# Patient Record
Sex: Female | Born: 1980 | Race: White | Hispanic: No | Marital: Single | Smoking: Never smoker
Health system: Southern US, Community
[De-identification: ages and names within clinical notes are randomized; demographics above are authoritative.]

## PROBLEM LIST (undated history)

## (undated) ENCOUNTER — Inpatient Hospital Stay (HOSPITAL_COMMUNITY): Payer: BLUE CROSS/BLUE SHIELD

## (undated) DIAGNOSIS — K8020 Calculus of gallbladder without cholecystitis without obstruction: Secondary | ICD-10-CM

## (undated) DIAGNOSIS — E042 Nontoxic multinodular goiter: Secondary | ICD-10-CM

## (undated) DIAGNOSIS — Z5189 Encounter for other specified aftercare: Secondary | ICD-10-CM

## (undated) HISTORY — DX: Encounter for other specified aftercare: Z51.89

---

## 2002-10-26 ENCOUNTER — Other Ambulatory Visit: Admission: RE | Admit: 2002-10-26 | Discharge: 2002-10-26 | Payer: Self-pay | Admitting: Family Medicine

## 2004-01-04 ENCOUNTER — Other Ambulatory Visit: Admission: RE | Admit: 2004-01-04 | Discharge: 2004-01-04 | Payer: Self-pay | Admitting: *Deleted

## 2007-08-31 ENCOUNTER — Other Ambulatory Visit: Admission: RE | Admit: 2007-08-31 | Discharge: 2007-08-31 | Payer: Self-pay | Admitting: Family Medicine

## 2007-09-14 ENCOUNTER — Encounter: Admission: RE | Admit: 2007-09-14 | Discharge: 2007-09-14 | Payer: Self-pay | Admitting: Family Medicine

## 2008-08-31 ENCOUNTER — Other Ambulatory Visit: Admission: RE | Admit: 2008-08-31 | Discharge: 2008-08-31 | Payer: Self-pay | Admitting: Family Medicine

## 2008-09-07 ENCOUNTER — Encounter: Admission: RE | Admit: 2008-09-07 | Discharge: 2008-09-07 | Payer: Self-pay | Admitting: Family Medicine

## 2009-10-09 ENCOUNTER — Encounter: Admission: RE | Admit: 2009-10-09 | Discharge: 2009-10-09 | Payer: Self-pay | Admitting: Family Medicine

## 2009-11-01 ENCOUNTER — Other Ambulatory Visit: Admission: RE | Admit: 2009-11-01 | Discharge: 2009-11-01 | Payer: Self-pay | Admitting: Obstetrics and Gynecology

## 2011-05-06 ENCOUNTER — Other Ambulatory Visit (HOSPITAL_COMMUNITY)
Admission: RE | Admit: 2011-05-06 | Discharge: 2011-05-06 | Disposition: A | Discharge status: Home / Self Care | Payer: Self-pay | Source: Ambulatory Visit | Attending: Family Medicine | Admitting: Family Medicine

## 2011-05-06 ENCOUNTER — Other Ambulatory Visit: Payer: Self-pay | Admitting: Family Medicine

## 2011-05-06 DIAGNOSIS — Z124 Encounter for screening for malignant neoplasm of cervix: Secondary | ICD-10-CM | POA: Insufficient documentation

## 2011-05-06 DIAGNOSIS — Z1159 Encounter for screening for other viral diseases: Secondary | ICD-10-CM | POA: Insufficient documentation

## 2013-03-07 ENCOUNTER — Other Ambulatory Visit (HOSPITAL_COMMUNITY)
Admission: RE | Admit: 2013-03-07 | Discharge: 2013-03-07 | Disposition: A | Discharge status: Home / Self Care | Payer: BC Managed Care – PPO | Source: Ambulatory Visit | Attending: Family Medicine | Admitting: Family Medicine

## 2013-03-07 ENCOUNTER — Other Ambulatory Visit: Payer: Self-pay | Admitting: Family Medicine

## 2013-03-07 DIAGNOSIS — Z113 Encounter for screening for infections with a predominantly sexual mode of transmission: Secondary | ICD-10-CM | POA: Insufficient documentation

## 2013-03-07 DIAGNOSIS — Z01419 Encounter for gynecological examination (general) (routine) without abnormal findings: Secondary | ICD-10-CM | POA: Insufficient documentation

## 2013-03-07 DIAGNOSIS — Z1151 Encounter for screening for human papillomavirus (HPV): Secondary | ICD-10-CM | POA: Insufficient documentation

## 2013-03-08 ENCOUNTER — Other Ambulatory Visit: Payer: Self-pay | Admitting: Family Medicine

## 2013-03-08 DIAGNOSIS — E041 Nontoxic single thyroid nodule: Secondary | ICD-10-CM

## 2013-03-15 ENCOUNTER — Ambulatory Visit
Admission: RE | Admit: 2013-03-15 | Discharge: 2013-03-15 | Disposition: A | Discharge status: Home / Self Care | Payer: BC Managed Care – PPO | Source: Ambulatory Visit | Attending: Family Medicine | Admitting: Family Medicine

## 2013-03-15 DIAGNOSIS — E041 Nontoxic single thyroid nodule: Secondary | ICD-10-CM

## 2014-12-15 ENCOUNTER — Other Ambulatory Visit: Payer: Self-pay | Admitting: Family Medicine

## 2014-12-15 DIAGNOSIS — N938 Other specified abnormal uterine and vaginal bleeding: Secondary | ICD-10-CM

## 2014-12-15 DIAGNOSIS — N926 Irregular menstruation, unspecified: Secondary | ICD-10-CM

## 2014-12-22 ENCOUNTER — Other Ambulatory Visit: Payer: Self-pay

## 2014-12-28 ENCOUNTER — Ambulatory Visit
Admission: RE | Admit: 2014-12-28 | Discharge: 2014-12-28 | Disposition: A | Discharge status: Home / Self Care | Payer: 59 | Source: Ambulatory Visit | Attending: Family Medicine | Admitting: Family Medicine

## 2014-12-28 DIAGNOSIS — N938 Other specified abnormal uterine and vaginal bleeding: Secondary | ICD-10-CM

## 2014-12-28 DIAGNOSIS — N926 Irregular menstruation, unspecified: Secondary | ICD-10-CM

## 2015-07-05 ENCOUNTER — Other Ambulatory Visit: Payer: Self-pay | Admitting: Family Medicine

## 2015-07-05 DIAGNOSIS — E041 Nontoxic single thyroid nodule: Secondary | ICD-10-CM

## 2015-07-12 ENCOUNTER — Ambulatory Visit
Admission: RE | Admit: 2015-07-12 | Discharge: 2015-07-12 | Disposition: A | Discharge status: Home / Self Care | Payer: 59 | Source: Ambulatory Visit | Attending: Family Medicine | Admitting: Family Medicine

## 2015-07-12 DIAGNOSIS — E041 Nontoxic single thyroid nodule: Secondary | ICD-10-CM

## 2015-09-28 ENCOUNTER — Other Ambulatory Visit (HOSPITAL_COMMUNITY)
Admission: RE | Admit: 2015-09-28 | Discharge: 2015-09-28 | Disposition: A | Discharge status: Home / Self Care | Payer: BLUE CROSS/BLUE SHIELD | Source: Ambulatory Visit | Attending: Physician Assistant | Admitting: Physician Assistant

## 2015-09-28 ENCOUNTER — Other Ambulatory Visit: Payer: Self-pay | Admitting: Physician Assistant

## 2015-09-28 DIAGNOSIS — Z113 Encounter for screening for infections with a predominantly sexual mode of transmission: Secondary | ICD-10-CM | POA: Diagnosis present

## 2015-09-28 DIAGNOSIS — Z01419 Encounter for gynecological examination (general) (routine) without abnormal findings: Secondary | ICD-10-CM | POA: Insufficient documentation

## 2015-10-02 LAB — CYTOLOGY - PAP

## 2015-11-29 ENCOUNTER — Other Ambulatory Visit: Payer: Self-pay | Admitting: Obstetrics & Gynecology

## 2015-12-27 DIAGNOSIS — N939 Abnormal uterine and vaginal bleeding, unspecified: Secondary | ICD-10-CM | POA: Diagnosis not present

## 2015-12-27 DIAGNOSIS — Z3009 Encounter for other general counseling and advice on contraception: Secondary | ICD-10-CM | POA: Diagnosis not present

## 2016-01-31 DIAGNOSIS — L304 Erythema intertrigo: Secondary | ICD-10-CM | POA: Diagnosis not present

## 2016-03-28 DIAGNOSIS — L259 Unspecified contact dermatitis, unspecified cause: Secondary | ICD-10-CM | POA: Diagnosis not present

## 2016-05-27 DIAGNOSIS — N912 Amenorrhea, unspecified: Secondary | ICD-10-CM | POA: Diagnosis not present

## 2016-05-27 DIAGNOSIS — Z202 Contact with and (suspected) exposure to infections with a predominantly sexual mode of transmission: Secondary | ICD-10-CM | POA: Diagnosis not present

## 2016-05-27 DIAGNOSIS — E669 Obesity, unspecified: Secondary | ICD-10-CM | POA: Diagnosis not present

## 2016-07-07 DIAGNOSIS — Z0000 Encounter for general adult medical examination without abnormal findings: Secondary | ICD-10-CM | POA: Diagnosis not present

## 2016-07-07 DIAGNOSIS — Z23 Encounter for immunization: Secondary | ICD-10-CM | POA: Diagnosis not present

## 2016-07-07 DIAGNOSIS — Z13220 Encounter for screening for lipoid disorders: Secondary | ICD-10-CM | POA: Diagnosis not present

## 2016-07-07 DIAGNOSIS — Z131 Encounter for screening for diabetes mellitus: Secondary | ICD-10-CM | POA: Diagnosis not present

## 2016-09-05 ENCOUNTER — Other Ambulatory Visit: Payer: Self-pay | Admitting: Physician Assistant

## 2016-09-05 ENCOUNTER — Ambulatory Visit
Admission: RE | Admit: 2016-09-05 | Discharge: 2016-09-05 | Disposition: A | Discharge status: Home / Self Care | Payer: BLUE CROSS/BLUE SHIELD | Source: Ambulatory Visit | Attending: Physician Assistant | Admitting: Physician Assistant

## 2016-09-05 DIAGNOSIS — M545 Low back pain: Secondary | ICD-10-CM | POA: Diagnosis not present

## 2016-09-05 DIAGNOSIS — S20219A Contusion of unspecified front wall of thorax, initial encounter: Secondary | ICD-10-CM | POA: Diagnosis not present

## 2016-09-05 DIAGNOSIS — M5430 Sciatica, unspecified side: Secondary | ICD-10-CM | POA: Diagnosis not present

## 2016-10-02 DIAGNOSIS — J45909 Unspecified asthma, uncomplicated: Secondary | ICD-10-CM | POA: Diagnosis not present

## 2017-01-08 DIAGNOSIS — Z6841 Body Mass Index (BMI) 40.0 and over, adult: Secondary | ICD-10-CM | POA: Diagnosis not present

## 2017-01-08 DIAGNOSIS — K219 Gastro-esophageal reflux disease without esophagitis: Secondary | ICD-10-CM | POA: Diagnosis not present

## 2017-01-08 DIAGNOSIS — Z7689 Persons encountering health services in other specified circumstances: Secondary | ICD-10-CM | POA: Diagnosis not present

## 2017-01-08 DIAGNOSIS — Z713 Dietary counseling and surveillance: Secondary | ICD-10-CM | POA: Diagnosis not present

## 2017-01-08 DIAGNOSIS — Z87891 Personal history of nicotine dependence: Secondary | ICD-10-CM | POA: Diagnosis not present

## 2017-01-15 DIAGNOSIS — Z6841 Body Mass Index (BMI) 40.0 and over, adult: Secondary | ICD-10-CM | POA: Diagnosis not present

## 2017-01-15 DIAGNOSIS — F54 Psychological and behavioral factors associated with disorders or diseases classified elsewhere: Secondary | ICD-10-CM | POA: Diagnosis not present

## 2017-01-15 DIAGNOSIS — Z87891 Personal history of nicotine dependence: Secondary | ICD-10-CM | POA: Diagnosis not present

## 2017-01-15 DIAGNOSIS — M20011 Mallet finger of right finger(s): Secondary | ICD-10-CM | POA: Diagnosis not present

## 2017-02-25 DIAGNOSIS — Z7689 Persons encountering health services in other specified circumstances: Secondary | ICD-10-CM | POA: Diagnosis not present

## 2017-02-25 DIAGNOSIS — Z6841 Body Mass Index (BMI) 40.0 and over, adult: Secondary | ICD-10-CM | POA: Diagnosis not present

## 2017-02-25 DIAGNOSIS — K2950 Unspecified chronic gastritis without bleeding: Secondary | ICD-10-CM | POA: Diagnosis not present

## 2017-02-25 DIAGNOSIS — K2901 Acute gastritis with bleeding: Secondary | ICD-10-CM | POA: Diagnosis not present

## 2017-02-25 DIAGNOSIS — K219 Gastro-esophageal reflux disease without esophagitis: Secondary | ICD-10-CM | POA: Diagnosis not present

## 2017-02-25 DIAGNOSIS — K228 Other specified diseases of esophagus: Secondary | ICD-10-CM | POA: Diagnosis not present

## 2017-02-26 DIAGNOSIS — M20011 Mallet finger of right finger(s): Secondary | ICD-10-CM | POA: Diagnosis not present

## 2017-03-18 DIAGNOSIS — Z6841 Body Mass Index (BMI) 40.0 and over, adult: Secondary | ICD-10-CM | POA: Diagnosis not present

## 2017-03-18 DIAGNOSIS — Z7689 Persons encountering health services in other specified circumstances: Secondary | ICD-10-CM | POA: Diagnosis not present

## 2017-03-18 DIAGNOSIS — Z01818 Encounter for other preprocedural examination: Secondary | ICD-10-CM | POA: Diagnosis not present

## 2017-03-26 DIAGNOSIS — M20011 Mallet finger of right finger(s): Secondary | ICD-10-CM | POA: Diagnosis not present

## 2017-04-23 DIAGNOSIS — M20011 Mallet finger of right finger(s): Secondary | ICD-10-CM | POA: Diagnosis not present

## 2017-05-29 DIAGNOSIS — M20011 Mallet finger of right finger(s): Secondary | ICD-10-CM | POA: Diagnosis not present

## 2017-07-02 DIAGNOSIS — Z01818 Encounter for other preprocedural examination: Secondary | ICD-10-CM | POA: Diagnosis not present

## 2017-07-02 DIAGNOSIS — Z87891 Personal history of nicotine dependence: Secondary | ICD-10-CM | POA: Diagnosis not present

## 2017-07-02 DIAGNOSIS — K2950 Unspecified chronic gastritis without bleeding: Secondary | ICD-10-CM | POA: Diagnosis not present

## 2017-07-02 DIAGNOSIS — E876 Hypokalemia: Secondary | ICD-10-CM | POA: Diagnosis not present

## 2017-07-02 DIAGNOSIS — E559 Vitamin D deficiency, unspecified: Secondary | ICD-10-CM | POA: Diagnosis not present

## 2017-07-02 DIAGNOSIS — K219 Gastro-esophageal reflux disease without esophagitis: Secondary | ICD-10-CM | POA: Diagnosis not present

## 2017-07-02 DIAGNOSIS — G4730 Sleep apnea, unspecified: Secondary | ICD-10-CM | POA: Diagnosis not present

## 2017-07-02 DIAGNOSIS — Z713 Dietary counseling and surveillance: Secondary | ICD-10-CM | POA: Diagnosis not present

## 2017-07-02 DIAGNOSIS — Z6841 Body Mass Index (BMI) 40.0 and over, adult: Secondary | ICD-10-CM | POA: Diagnosis not present

## 2017-07-02 HISTORY — PX: LAPAROSCOPIC GASTRIC SLEEVE RESECTION: SHX5895

## 2017-07-14 DIAGNOSIS — Z23 Encounter for immunization: Secondary | ICD-10-CM | POA: Diagnosis not present

## 2017-07-17 DIAGNOSIS — K219 Gastro-esophageal reflux disease without esophagitis: Secondary | ICD-10-CM | POA: Diagnosis not present

## 2017-07-17 DIAGNOSIS — Z6841 Body Mass Index (BMI) 40.0 and over, adult: Secondary | ICD-10-CM | POA: Diagnosis not present

## 2017-07-17 DIAGNOSIS — K449 Diaphragmatic hernia without obstruction or gangrene: Secondary | ICD-10-CM | POA: Diagnosis not present

## 2017-07-17 DIAGNOSIS — R609 Edema, unspecified: Secondary | ICD-10-CM | POA: Diagnosis not present

## 2017-07-17 DIAGNOSIS — Z87891 Personal history of nicotine dependence: Secondary | ICD-10-CM | POA: Diagnosis not present

## 2017-08-03 DIAGNOSIS — Z131 Encounter for screening for diabetes mellitus: Secondary | ICD-10-CM | POA: Diagnosis not present

## 2017-08-03 DIAGNOSIS — Z202 Contact with and (suspected) exposure to infections with a predominantly sexual mode of transmission: Secondary | ICD-10-CM | POA: Diagnosis not present

## 2017-08-03 DIAGNOSIS — Z13220 Encounter for screening for lipoid disorders: Secondary | ICD-10-CM | POA: Diagnosis not present

## 2017-08-03 DIAGNOSIS — Z0000 Encounter for general adult medical examination without abnormal findings: Secondary | ICD-10-CM | POA: Diagnosis not present

## 2017-08-04 DIAGNOSIS — Z6841 Body Mass Index (BMI) 40.0 and over, adult: Secondary | ICD-10-CM | POA: Diagnosis not present

## 2017-08-04 DIAGNOSIS — Z48815 Encounter for surgical aftercare following surgery on the digestive system: Secondary | ICD-10-CM | POA: Diagnosis not present

## 2017-08-04 DIAGNOSIS — F54 Psychological and behavioral factors associated with disorders or diseases classified elsewhere: Secondary | ICD-10-CM | POA: Diagnosis not present

## 2017-08-04 DIAGNOSIS — Z9884 Bariatric surgery status: Secondary | ICD-10-CM | POA: Diagnosis not present

## 2017-08-04 DIAGNOSIS — Z713 Dietary counseling and surveillance: Secondary | ICD-10-CM | POA: Diagnosis not present

## 2017-10-16 DIAGNOSIS — Z9884 Bariatric surgery status: Secondary | ICD-10-CM | POA: Diagnosis not present

## 2017-10-16 DIAGNOSIS — K912 Postsurgical malabsorption, not elsewhere classified: Secondary | ICD-10-CM | POA: Diagnosis not present

## 2017-10-16 DIAGNOSIS — Z713 Dietary counseling and surveillance: Secondary | ICD-10-CM | POA: Diagnosis not present

## 2017-10-16 DIAGNOSIS — Z6837 Body mass index (BMI) 37.0-37.9, adult: Secondary | ICD-10-CM | POA: Diagnosis not present

## 2017-10-16 DIAGNOSIS — Z48815 Encounter for surgical aftercare following surgery on the digestive system: Secondary | ICD-10-CM | POA: Diagnosis not present

## 2017-10-16 DIAGNOSIS — E669 Obesity, unspecified: Secondary | ICD-10-CM | POA: Diagnosis not present

## 2017-10-16 DIAGNOSIS — E739 Lactose intolerance, unspecified: Secondary | ICD-10-CM | POA: Diagnosis not present

## 2017-10-16 DIAGNOSIS — Z87891 Personal history of nicotine dependence: Secondary | ICD-10-CM | POA: Diagnosis not present

## 2018-01-21 DIAGNOSIS — L237 Allergic contact dermatitis due to plants, except food: Secondary | ICD-10-CM | POA: Diagnosis not present

## 2018-02-25 DIAGNOSIS — I87393 Chronic venous hypertension (idiopathic) with other complications of bilateral lower extremity: Secondary | ICD-10-CM | POA: Diagnosis not present

## 2018-04-17 ENCOUNTER — Emergency Department: Payer: BLUE CROSS/BLUE SHIELD

## 2018-04-17 ENCOUNTER — Emergency Department
Admission: EM | Admit: 2018-04-17 | Discharge: 2018-04-17 | Disposition: A | Discharge status: Home / Self Care | Payer: BLUE CROSS/BLUE SHIELD | Attending: Emergency Medicine | Admitting: Emergency Medicine

## 2018-04-17 ENCOUNTER — Other Ambulatory Visit: Payer: Self-pay

## 2018-04-17 DIAGNOSIS — R0789 Other chest pain: Secondary | ICD-10-CM

## 2018-04-17 DIAGNOSIS — R079 Chest pain, unspecified: Secondary | ICD-10-CM | POA: Diagnosis not present

## 2018-04-17 DIAGNOSIS — K8020 Calculus of gallbladder without cholecystitis without obstruction: Secondary | ICD-10-CM | POA: Diagnosis not present

## 2018-04-17 LAB — CBC
HEMATOCRIT: 37.9 % (ref 35.0–47.0)
Hemoglobin: 13.2 g/dL (ref 12.0–16.0)
MCH: 30.6 pg (ref 26.0–34.0)
MCHC: 35 g/dL (ref 32.0–36.0)
MCV: 87.5 fL (ref 80.0–100.0)
Platelets: 223 10*3/uL (ref 150–440)
RBC: 4.33 MIL/uL (ref 3.80–5.20)
RDW: 13 % (ref 11.5–14.5)
WBC: 8 10*3/uL (ref 3.6–11.0)

## 2018-04-17 LAB — BASIC METABOLIC PANEL
ANION GAP: 9 (ref 5–15)
BUN: 18 mg/dL (ref 6–20)
CHLORIDE: 107 mmol/L (ref 98–111)
CO2: 23 mmol/L (ref 22–32)
Calcium: 8.5 mg/dL — ABNORMAL LOW (ref 8.9–10.3)
Creatinine, Ser: 0.84 mg/dL (ref 0.44–1.00)
GFR calc Af Amer: >60 mL/min (ref >60)
GFR calc non Af Amer: >60 mL/min (ref >60)
GLUCOSE: 144 mg/dL — AB (ref 70–99)
POTASSIUM: 3.8 mmol/L (ref 3.5–5.1)
Sodium: 139 mmol/L (ref 135–145)

## 2018-04-17 LAB — HEPATIC FUNCTION PANEL
ALBUMIN: 3.6 g/dL (ref 3.5–5.0)
ALT: 9 U/L (ref 0–44)
AST: 21 U/L (ref 15–41)
Alkaline Phosphatase: 54 U/L (ref 38–126)
BILIRUBIN TOTAL: 0.6 mg/dL (ref 0.3–1.2)
Total Protein: 6.6 g/dL (ref 6.5–8.1)

## 2018-04-17 LAB — TROPONIN I
Troponin I: <0.03 ng/mL (ref <0.03)
Troponin I: <0.03 ng/mL (ref <0.03)

## 2018-04-17 LAB — LIPASE, BLOOD: Lipase: 34 U/L (ref 11–51)

## 2018-04-17 MED ORDER — OXYCODONE-ACETAMINOPHEN 5-325 MG PO TABS: 1.0000 | ORAL_TABLET | Freq: Four times a day (QID) | ORAL | 0 refills | Status: DC | PRN

## 2018-04-17 MED ORDER — ONDANSETRON HCL 4 MG/2ML IJ SOLN
4.0000 mg | Freq: Once | INTRAMUSCULAR | Status: AC
  Administered 2018-04-17: 4 mg via INTRAVENOUS
  Filled 2018-04-17: qty 2

## 2018-04-17 MED ORDER — MORPHINE SULFATE (PF) 4 MG/ML IV SOLN
4.0000 mg | Freq: Once | INTRAVENOUS | Status: AC
Start: 2018-04-17 — End: 2018-04-17
  Administered 2018-04-17: 4 mg via INTRAVENOUS
  Filled 2018-04-17: qty 1

## 2018-04-17 MED ORDER — SODIUM CHLORIDE 0.9 % IV BOLUS
1000.0000 mL | Freq: Once | INTRAVENOUS | Status: AC
  Administered 2018-04-17: 1000 mL via INTRAVENOUS

## 2018-04-17 MED ORDER — FAMOTIDINE IN NACL 20-0.9 MG/50ML-% IV SOLN
20.0000 mg | Freq: Once | INTRAVENOUS | Status: AC
  Administered 2018-04-17: 20 mg via INTRAVENOUS
  Filled 2018-04-17: qty 50

## 2018-04-17 NOTE — ED Notes (Signed)
Reviewed discharge instructions, follow-up care, and prescriptions with patient. Patient verbalized understanding of all information reviewed. Patient stable, with no distress noted at this time.    

## 2018-04-17 NOTE — ED Notes (Signed)
Pt to ultrasound

## 2018-04-17 NOTE — ED Notes (Signed)
Pt assisted to restroom.  

## 2018-04-17 NOTE — ED Notes (Signed)
Pt requested beverage, Dr Marisa SeverinSiadecki granted permission, pt was given apple juice at her request. Pt drank beverage without incident.

## 2018-04-17 NOTE — ED Triage Notes (Addendum)
Pt arrives via [facility] c/o acute onset CP. Pt states her pain has largely resolved since arrival describing an episode about an hour previously of 10/10 non radiating, pressure like centralized CP, positive for SOB/N/diaphesis. Pt also states she has been having indigestion problems x 1 week.   Protocol initiated.

## 2018-04-17 NOTE — Discharge Instructions (Signed)
Make an appointment to follow-up with the general surgeon within the next few weeks.  You may take the prescribed medication if you have an episode of pain similar to today.  Return to the ER immediately for new, worsening, persistent severe pain, nausea or vomiting, fever, weakness, or any other new or worsening symptoms that concern you.

## 2018-04-17 NOTE — ED Provider Notes (Signed)
[facility] Emergency Department Provider Note ____________________________________________   First MD Initiated Contact with Patient 04/17/18 1438     (approximate)  I have reviewed the triage vital signs and the nursing notes.   HISTORY  Chief Complaint Chest Pain    HPI KORRINE SICARD is a 37 y.o. 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  The patient describes the discomfort as pressure or squeezing.  It was not exertional.  She states that the diaphoresis and near syncopal symptoms have resolved after she took a Prilosec although she still intermittently feels the pressure.  She states she had one similar prior episode after her surgery which improved after she took an antacid, but she states today's episode was more severe.   History reviewed. No pertinent past medical history.  There are no active problems to display for this patient.   Past Surgical History:  Procedure Laterality Date  . LAPAROSCOPIC GASTRIC BANDING WITH HIATAL HERNIA REPAIR      Prior to Admission medications   Medication Sig Start Date End Date Taking? Authorizing Provider  oxyCODONE-acetaminophen (PERCOCET) 5-325 MG tablet Take 1 tablet by mouth every 6 (six) hours as needed for severe pain. 04/17/18   Dionne Bucy, MD    Allergies Patient has no known allergies.  History reviewed. No pertinent family history.  Social History Social History   Tobacco Use  . Smoking status: Never Smoker  . Smokeless tobacco: Never Used  Substance Use Topics  . Alcohol use: Not Currently  . Drug use: Never    Review of Systems  Constitutional: No fever. Eyes: No redness. ENT: No sore throat. Cardiovascular: Positive for chest pain. Respiratory: Positive for resolved shortness of  breath. Gastrointestinal: Positive for resolved nausea.  Genitourinary: Negative for flank pain.  Musculoskeletal: Negative for back pain. Skin: Negative for rash. Neurological: Negative for headache.   ____________________________________________   PHYSICAL EXAM:  VITAL SIGNS: ED Triage Vitals  Enc Vitals Group     BP --      Pulse --      Resp --      Temp 04/17/18 1443 97.8 F (36.6 C)     Temp Source 04/17/18 1443 Oral     SpO2 --      Weight 04/17/18 1444 260 lb (117.9 kg)     Height 04/17/18 1444 5\' 8"  (1.727 m)     Head Circumference --      Peak Flow --      Pain Score 04/17/18 1443 4     Pain Loc --      Pain Edu? --      Excl. in GC? --     Constitutional: Alert and oriented.  Relatively well appearing and in no acute distress. Eyes: Conjunctivae are normal.  Head: Atraumatic. Nose: No congestion/rhinnorhea. Mouth/Throat: Mucous membranes are moist.   Neck: Normal range of motion.  Cardiovascular: Normal rate, regular rhythm. Grossly normal heart sounds.  Good peripheral circulation. Respiratory: Normal respiratory effort.  No retractions. Lungs CTAB. Gastrointestinal: Soft and nontender. No distention.  Genitourinary: No flank tenderness. Musculoskeletal: No lower extremity edema.  Extremities warm and well perfused.  No calf or popliteal swelling or tenderness. Neurologic:  Normal speech and language. No gross focal neurologic deficits are appreciated.  Skin:  Skin is warm and dry. No rash noted.  Psychiatric: Mood and affect are normal. Speech and behavior are normal.  ____________________________________________   LABS (all labs ordered are listed, but only abnormal results are displayed)  Labs Reviewed  BASIC METABOLIC PANEL - Abnormal; Notable for the following components:      Result Value   Glucose, Bld 144 (*)    Calcium 8.5 (*)    All other components within normal limits  CBC  TROPONIN I  LIPASE, BLOOD  HEPATIC FUNCTION PANEL    TROPONIN I   ____________________________________________  EKG  ED ECG REPORT I, SebastDionne Bucyian Clevie Prout, the attending physician, personally viewed and interpreted this ECG.  Date: 04/17/2018 EKG Time: 1429 Rate: 78 Rhythm: normal sinus rhythm QRS Axis: normal Intervals: normal ST/T Wave abnormalities: normal Narrative Interpretation: no evidence of acute ischemia  ____________________________________________  RADIOLOGY  CXR: No focal infiltrate or other acute abnormalities US RUQ: Cholelithiasis  ____________________________________________   PROCEDURES  Procedure(s) performed: No  Procedures  Critical Care performed: No ____________________________________________   INITIAL IMPRESSION / ASSESSMENT AND PLAN / ED COURSE  Pertinent labs & imaging results that were available during my care of the patient were reviewed by me and considered in my medical decision making (see chart for details).  37 year old female with history of gastric sleeve surgery presents with atypical, nonexertional chest discomfort associated with apparent near syncope earlier today while she was at work.  She reports one prior similar episode related to likely reflux after her gastric surgery.  On exam, the patient is slightly anxious but otherwise relatively well-appearing.  Her vital signs are normal.  The remainder of the exam is as described above.  Her EKG is nonischemic.  Overall I suspect most likely GI cause including GERD, gastritis, or less likely pancreatitis.  The patient has no cardiac risk factors and given the nonexertional nature of the symptoms, the fact that the improved after Prilosec, and the normal EKG, ACS is extremely unlikely.  Given the normal vital signs and well appearance, there is no evidence of aortic dissection or other vascular etiology.  Although the patient is on ICPs, with a normal heart rate and O2 saturation as well as symptoms improving on their own, and no  symptoms to suggest DVT, there is no evidence of PE.  Plan: IV fluids, symptomatic treatment with Pepcid, troponins x2, basic and hepatobiliary labs, and reassess.  ----------------------------------------- 4:57 PM on 04/17/2018 -----------------------------------------  Initial labs were unremarkable, however the patient reported that the pain started to increase again.  On further discussion with the patient and her mother who is in the room with her, there is an extensive family history of gallbladder disease, and the mother had cholecystitis presenting with similar symptoms to what the patient is experiencing today.  Although the patient has normal LFTs and no abdominal tenderness, this could certainly be an atypical presentation of biliary colic so I will obtain a right upper quadrant ultrasound.  ----------------------------------------- 7:29 PM on 04/17/2018 -----------------------------------------  Ultrasound shows cholelithiasis, with a stone in the gallbladder neck but no evidence of acute cholecystitis.  The patient's symptoms have significantly improved.  Repeat troponin after 3 hours is also negative.  It is possible that the patient's pain today was in fact caused by biliary colic.  I counseled the patient extensively on the results of the work-up.  We have provided referral to a general surgeon, and I also thoroughly discussed return precautions with the patient.  She expresses understanding, and agrees with the plan of care.  She feels comfortable to  go home.  ____________________________________________   FINAL CLINICAL IMPRESSION(S) / ED DIAGNOSES  Final diagnoses:  Cholelithiasis  Chest discomfort      NEW MEDICATIONS STARTED DURING THIS VISIT:  New Prescriptions   OXYCODONE-ACETAMINOPHEN (PERCOCET) 5-325 MG TABLET    Take 1 tablet by mouth every 6 (six) hours as needed for severe pain.     Note:  This document was prepared using Dragon voice recognition  software and may include unintentional dictation errors.    Dionne BucySiadecki, Prerana Strayer, MD 04/17/18 1930

## 2018-04-19 ENCOUNTER — Emergency Department (HOSPITAL_COMMUNITY): Payer: BLUE CROSS/BLUE SHIELD

## 2018-04-19 ENCOUNTER — Other Ambulatory Visit: Payer: Self-pay

## 2018-04-19 ENCOUNTER — Encounter (HOSPITAL_COMMUNITY): Payer: Self-pay | Admitting: *Deleted

## 2018-04-19 ENCOUNTER — Inpatient Hospital Stay (HOSPITAL_COMMUNITY)
Admission: EM | Admit: 2018-04-19 | Discharge: 2018-04-22 | DRG: 419 | Disposition: A | Discharge status: Home / Self Care | Payer: BLUE CROSS/BLUE SHIELD | Attending: General Surgery | Admitting: General Surgery

## 2018-04-19 DIAGNOSIS — K2960 Other gastritis without bleeding: Secondary | ICD-10-CM

## 2018-04-19 DIAGNOSIS — K824 Cholesterolosis of gallbladder: Secondary | ICD-10-CM | POA: Diagnosis not present

## 2018-04-19 DIAGNOSIS — Z9884 Bariatric surgery status: Secondary | ICD-10-CM | POA: Diagnosis not present

## 2018-04-19 DIAGNOSIS — K8020 Calculus of gallbladder without cholecystitis without obstruction: Secondary | ICD-10-CM | POA: Diagnosis not present

## 2018-04-19 DIAGNOSIS — R1011 Right upper quadrant pain: Secondary | ICD-10-CM

## 2018-04-19 DIAGNOSIS — K668 Other specified disorders of peritoneum: Secondary | ICD-10-CM | POA: Diagnosis not present

## 2018-04-19 DIAGNOSIS — K8010 Calculus of gallbladder with chronic cholecystitis without obstruction: Secondary | ICD-10-CM | POA: Diagnosis not present

## 2018-04-19 DIAGNOSIS — Z79899 Other long term (current) drug therapy: Secondary | ICD-10-CM | POA: Diagnosis not present

## 2018-04-19 DIAGNOSIS — K8050 Calculus of bile duct without cholangitis or cholecystitis without obstruction: Secondary | ICD-10-CM | POA: Diagnosis present

## 2018-04-19 DIAGNOSIS — K8062 Calculus of gallbladder and bile duct with acute cholecystitis without obstruction: Secondary | ICD-10-CM | POA: Diagnosis not present

## 2018-04-19 DIAGNOSIS — Z6839 Body mass index (BMI) 39.0-39.9, adult: Secondary | ICD-10-CM

## 2018-04-19 DIAGNOSIS — R079 Chest pain, unspecified: Secondary | ICD-10-CM | POA: Diagnosis not present

## 2018-04-19 DIAGNOSIS — R1013 Epigastric pain: Secondary | ICD-10-CM | POA: Diagnosis not present

## 2018-04-19 DIAGNOSIS — R0789 Other chest pain: Secondary | ICD-10-CM

## 2018-04-19 HISTORY — DX: Calculus of gallbladder without cholecystitis without obstruction: K80.20

## 2018-04-19 LAB — COMPREHENSIVE METABOLIC PANEL
ALT: 73 U/L — AB (ref 0–44)
ANION GAP: 6 (ref 5–15)
AST: 99 U/L — ABNORMAL HIGH (ref 15–41)
Albumin: 3.3 g/dL — ABNORMAL LOW (ref 3.5–5.0)
Alkaline Phosphatase: 141 U/L — ABNORMAL HIGH (ref 38–126)
BUN: 11 mg/dL (ref 6–20)
CHLORIDE: 107 mmol/L (ref 98–111)
CO2: 27 mmol/L (ref 22–32)
CREATININE: 0.96 mg/dL (ref 0.44–1.00)
Calcium: 8.5 mg/dL — ABNORMAL LOW (ref 8.9–10.3)
Glucose, Bld: 93 mg/dL (ref 70–99)
Potassium: 4.3 mmol/L (ref 3.5–5.1)
SODIUM: 140 mmol/L (ref 135–145)
Total Bilirubin: 1.9 mg/dL — ABNORMAL HIGH (ref 0.3–1.2)
Total Protein: 6.1 g/dL — ABNORMAL LOW (ref 6.5–8.1)

## 2018-04-19 LAB — CBC
HCT: 39.5 % (ref 36.0–46.0)
Hemoglobin: 12.5 g/dL (ref 12.0–15.0)
MCH: 29.2 pg (ref 26.0–34.0)
MCHC: 31.6 g/dL (ref 30.0–36.0)
MCV: 92.3 fL (ref 78.0–100.0)
PLATELETS: 227 10*3/uL (ref 150–400)
RBC: 4.28 MIL/uL (ref 3.87–5.11)
RDW: 12.6 % (ref 11.5–15.5)
WBC: 7.2 10*3/uL (ref 4.0–10.5)

## 2018-04-19 LAB — LIPASE, BLOOD: LIPASE: 37 U/L (ref 11–51)

## 2018-04-19 MED ORDER — OXYCODONE-ACETAMINOPHEN 5-325 MG PO TABS
1.0000 | ORAL_TABLET | Freq: Four times a day (QID) | ORAL | Status: DC | PRN
  Administered 2018-04-21 – 2018-04-22 (×4): 1 via ORAL
  Filled 2018-04-19 (×4): qty 1

## 2018-04-19 MED ORDER — ENOXAPARIN SODIUM 30 MG/0.3ML ~~LOC~~ SOLN: 30.0000 mg | SUBCUTANEOUS | Status: DC

## 2018-04-19 MED ORDER — MORPHINE SULFATE (PF) 4 MG/ML IV SOLN
4.0000 mg | Freq: Once | INTRAVENOUS | Status: AC
  Administered 2018-04-19: 4 mg via INTRAMUSCULAR
  Filled 2018-04-19: qty 1

## 2018-04-19 MED ORDER — FAMOTIDINE IN NACL 20-0.9 MG/50ML-% IV SOLN
20.0000 mg | Freq: Two times a day (BID) | INTRAVENOUS | Status: DC
  Administered 2018-04-20 – 2018-04-22 (×6): 20 mg via INTRAVENOUS
  Filled 2018-04-19 (×6): qty 50

## 2018-04-19 MED ORDER — SODIUM CHLORIDE 0.9 % IV SOLN
3.0000 g | Freq: Three times a day (TID) | INTRAVENOUS | Status: DC
  Administered 2018-04-19 – 2018-04-22 (×8): 3 g via INTRAVENOUS
  Filled 2018-04-19 (×9): qty 3

## 2018-04-19 MED ORDER — LACTATED RINGERS IV SOLN
INTRAVENOUS | Status: DC
  Administered 2018-04-19 – 2018-04-21 (×5): via INTRAVENOUS

## 2018-04-19 MED ORDER — MORPHINE SULFATE (PF) 2 MG/ML IV SOLN: 2.0000 mg | INTRAVENOUS | Status: DC | PRN

## 2018-04-19 NOTE — H&P (Signed)
History and Physical    DOA: 04/19/2018  PCP: Clayborn Heron, MD  Patient coming from: home  Chief Complaint: Worsening abdominal pain  HPI: Kristy Hines is a 37 y.o. female with no significant past medical history except for undergoing gastric sleeve procedure at [facility] in November 2018 presents with worsening abdominal pain.  Patient presented on Saturday to [facility] regional with complaints of right upper quadrant pain and chest discomfort which she describes as "shooting and burning pain" for which she underwent work-up revealing cholelithiasis with gallstone at the neck of the gallbladder.  She also underwent chest pain work-up with EKG and cardiac enzymes.  She was discharged home with instructions to follow-up general surgery as outpatient.  She presented to ED today with worsening symptoms.  She denies any fever or jaundice or diarrhea.  She reports orange discoloration of urine.  She states her abdominal pain was 8/10 on presentation and improved currently to 4 out of 10 after pain medications.  On presentation her pain was radiating to back and associated with nausea but no vomiting.  Work-up in the ED today revealed elevated liver enzymes with total bili going up to 1.9 and alkalinephos at 144, no evidence of leukocytosis and ultrasonogram abdomen showed cholelithiasis/stone at gallbladder neck as well as 6 mm stone at the distal CBD.  Per ED physician GI recommended admission to hospitalist service for ERCP in a.m. General surgery also aware , they will evaluate in a.m.   Review of Systems: As per HPI otherwise 10 point review of systems negative.    Past Medical History:  Diagnosis Date  . Gallstones     Past Surgical History:  Procedure Laterality Date  . LAPAROSCOPIC GASTRIC BANDING WITH HIATAL HERNIA REPAIR      Social history:  reports that she has never smoked. She has never used smokeless tobacco. She reports that she drank alcohol. She reports that she does not  use drugs.   No Known Allergies  History reviewed. No pertinent family history.    Prior to Admission medications   Medication Sig Start Date End Date Taking? Authorizing Provider  SPRINTEC 28 0.25-35 MG-MCG tablet Take 1 tablet by mouth daily. 01/28/18  Yes [provider]  oxyCODONE-acetaminophen (PERCOCET) 5-325 MG tablet Take 1 tablet by mouth every 6 (six) hours as needed for severe pain. 04/17/18   Dionne Bucy, MD    Physical Exam: Vitals:   04/19/18 1448 04/19/18 1853  BP: (!) 128/97 126/84  Pulse: 72 64  Resp: 20 18  Temp: 97.9 F (36.6 C)   TempSrc: Oral   SpO2: 100% 100%    Constitutional: NAD, calm, comfortable Vitals:   04/19/18 1448 04/19/18 1853  BP: (!) 128/97 126/84  Pulse: 72 64  Resp: 20 18  Temp: 97.9 F (36.6 C)   TempSrc: Oral   SpO2: 100% 100%   Eyes: PERRL, lids and conjunctivae normal ENMT: Mucous membranes are moist. Posterior pharynx clear of any exudate or lesions.Normal dentition.  Neck: normal, supple, no masses, no thyromegaly Respiratory: clear to auscultation bilaterally, no wheezing, no crackles. Normal respiratory effort. No accessory muscle use.  Cardiovascular: Regular rate and rhythm, no murmurs / rubs / gallops. No extremity edema. 2+ pedal pulses. No carotid bruits.  Abdomen: Mild epigastric and right upper quadrant tenderness but Murphy sign negative on my exam, no masses palpated. No hepatosplenomegaly. Bowel sounds positive.  Musculoskeletal: no clubbing / cyanosis. No joint deformity upper and lower extremities. Good ROM, no contractures. Normal muscle  tone.  Neurologic: CN 2-12 grossly intact. Sensation intact, DTR normal. Strength 5/5 in all 4.  Psychiatric: Normal judgment and insight. Alert and oriented x 3. Normal mood.  SKIN/catheters: no rashes, lesions, ulcers. No induration  Labs on Admission: I have personally reviewed following labs and imaging studies  CBC: Recent Labs  Lab 04/17/18 1434  04/19/18 1538  WBC 8.0 7.2  HGB 13.2 12.5  HCT 37.9 39.5  MCV 87.5 92.3  PLT 223 227   Basic Metabolic Panel: Recent Labs  Lab 04/17/18 1434 04/19/18 1538  NA 139 140  K 3.8 4.3  CL 107 107  CO2 23 27  GLUCOSE 144* 93  BUN 18 11  CREATININE 0.84 0.96  CALCIUM 8.5* 8.5*   GFR: Estimated Creatinine Clearance: 106.2 mL/min (by C-G formula based on SCr of 0.96 mg/dL). Liver Function Tests: Recent Labs  Lab 04/17/18 1500 04/19/18 1538  AST 21 99*  ALT 9 73*  ALKPHOS 54 141*  BILITOT 0.6 1.9*  PROT 6.6 6.1*  ALBUMIN 3.6 3.3*   Recent Labs  Lab 04/17/18 1500 04/19/18 1538  LIPASE 34 37   No results for input(s): AMMONIA in the last 168 hours. Coagulation Profile: No results for input(s): INR, PROTIME in the last 168 hours. Cardiac Enzymes: Recent Labs  Lab 04/17/18 1434 04/17/18 1822  TROPONINI <0.03 <0.03   BNP (last 3 results) No results for input(s): PROBNP in the last 8760 hours. HbA1C: No results for input(s): HGBA1C in the last 72 hours. CBG: No results for input(s): GLUCAP in the last 168 hours. Lipid Profile: No results for input(s): CHOL, HDL, LDLCALC, TRIG, CHOLHDL, LDLDIRECT in the last 72 hours. Thyroid Function Tests: No results for input(s): TSH, T4TOTAL, FREET4, T3FREE, THYROIDAB in the last 72 hours. Anemia Panel: No results for input(s): VITAMINB12, FOLATE, FERRITIN, TIBC, IRON, RETICCTPCT in the last 72 hours. Urine analysis: No results found for: COLORURINE, APPEARANCEUR, LABSPEC, PHURINE, GLUCOSEU, HGBUR, BILIRUBINUR, KETONESUR, PROTEINUR, UROBILINOGEN, NITRITE, LEUKOCYTESUR  Radiological Exams on Admission: Koreas Abdomen Limited Ruq  Result Date: 04/19/2018 CLINICAL DATA:  RIGHT upper quadrant pain.  Known cholelithiasis. EXAM: ULTRASOUND ABDOMEN LIMITED RIGHT UPPER QUADRANT COMPARISON:  RIGHT upper quadrant ultrasound April 17, 2018 FINDINGS: Gallbladder: Multiple echogenic gallstones measuring to 5 mm with acoustic shadowing.  Nonmobile stones at the neck are unchanged. Gallbladder wall thickening at 5 mm similar to slightly increased from prior sonogram. No pericholecystic fluid. No sonographic Murphy sign elicited. Common bile duct: Diameter: 6 mm with echogenic stone in distal common bile duct (image 21). 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 No intrahepatic biliary dilatation. Within normal limits in parenchymal echogenicity. Portal vein is patent on color Doppler imaging with normal direction of blood flow towards the liver. IMPRESSION: 1. Cholelithiasis with similar nonmobile gallstones at the neck. Choledocholithiasis. 2. Similar to increased gallbladder wall thickening without corroborated findings of acute cholecystitis. Electronically Signed   By: Awilda Metroourtnay  Bloomer M.D.   On: 04/19/2018 17:09       Assessment and Plan:   1.  Choledocholithiasis/abdominal pain: We will admit with clear liquid diet and n.p.o. after midnight per GI recommendations.  Plan for ERCP in a.m.  Lipase is normal.  Will likely need cholecystectomy as well prior to discharge.  No evidence of fever/leukocytosis or gallbladder inflammation on imaging.  Will order empiric Unasyn/ IV fluids.   2.  Atypical chest pain: No chest pain currently.  Might have been referred pain.  EKG shows normal sinus rhythm.  Recent troponin negative.  Will order IV Pepcid for GERD/gastritis.  DVT prophylaxis: Lovenox  Code Status: Full code  Family Communication: Discussed with patient and father bedside. Health care proxy would be mother Dondra SpryGail who could be reached at [phone] Consults called: General surgery Dr. Andrey CampanileWilson has been contacted by ED.  Consult order placed.  Patient seen by GI Admission status:  Patient admitted as inpatient as anticipated LOS greater than 2 midnights    Alessandra BevelsNeelima Carron Mcmurry MD [facility] Pager [phone]  If 7PM-7AM, please contact night-coverage [email or web address] Password  [email or web address]  04/19/2018, 6:59 PM

## 2018-04-19 NOTE — ED Notes (Signed)
Pt given apple juice and clear liquid diet tray ordered for pt.

## 2018-04-19 NOTE — ED Provider Notes (Signed)
Patient placed in Quick Look pathway, seen and evaluated   Chief Complaint: Abdominal/chest pain  HPI:   37 year old female presents with abdominal pain radiating to the chest for the past 2 days. She was seen at [facility] 2 days ago for a "gallbladder attack" and had an US done which showed a stone in the gallbladder neck. Her pain improved with Morphine and pepcid and she was discharged and advised to f/u with general surgery. She was told to return if pain worsened. It worsened today so she is here. She tried hydrocodone without relief  ROS: +abdominal pain  Physical Exam:   Gen: No distress  Neuro: Awake and Alert  Skin: Warm    Focused Exam: Heart: Regular rate and rhythm     Lungs: CTA    Abdomen: Tender in epigastric area   Initiation of care has begun. The patient has been counseled on the process, plan, and necessity for staying for the completion/evaluation, and the remainder of the medical screening examination    Bethel BornGekas, Geoffery Aultman Marie, PA-C 04/19/18 1539    Gwyneth SproutPlunkett, Whitney, MD 04/19/18 2033

## 2018-04-19 NOTE — ED Provider Notes (Signed)
[facility]ENC[facility] Provider Note   CSN: [id number] Arrival date & time: 04/19/18  1441     History   Chief Complaint Chief Complaint  Patient presents with  . Abdominal Pain  . Chest Pain    HPI Kristy Hines is a 37 y.o. female.  HPI   37 year old female presents today with complaints of right upper quadrant pain. Patient notes 2 days ago she had a flare of right upper quadrant abdominal pain and chest pain. She was seen at [facility] where she had an ultrasound showing a stone in the neck for gallbladder. She reports symptoms again recurred today severe nature and improved with hydrocodone at home. She notes this was probably 45 minutes after eating. She denies any fever, nausea or vomiting. 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   Past Medical History:  Diagnosis Date  . Gallstones     Patient Active Problem List   Diagnosis Date Noted  . Choledocholithiasis 04/19/2018    Past Surgical History:  Procedure Laterality Date  . LAPAROSCOPIC GASTRIC BANDING WITH HIATAL HERNIA REPAIR       OB History   None      Home Medications    Prior to Admission medications   Medication Sig Start Date End Date Taking? Authorizing Provider  SPRINTEC 28 0.25-35 MG-MCG tablet Take 1 tablet by mouth daily. 01/28/18  Yes [provider]  oxyCODONE-acetaminophen (PERCOCET) 5-325 MG tablet Take 1 tablet by mouth every 6 (six) hours as needed for severe pain. 04/17/18   Dionne BucySiadecki, Sebastian, MD    Family History History reviewed. No pertinent family history.  Social History Social History   Tobacco Use  . Smoking status: Never Smoker  . Smokeless tobacco: Never Used  Substance Use Topics  . Alcohol use: Not Currently  . Drug use: Never     Allergies   Patient has no known allergies.   Review of Systems Review of Systems  All other systems reviewed and are negative.  Physical  Exam Updated Vital Signs BP 126/84   Pulse 64   Temp 97.9 F (36.6 C) (Oral)   Resp 18   LMP 04/19/2018   SpO2 100%   Physical Exam  Constitutional: She is oriented to person, place, and time. She appears well-developed and well-nourished.  HENT:  Head: Normocephalic and atraumatic.  Eyes: Pupils are equal, round, and reactive to light. Conjunctivae are normal. Right eye exhibits no discharge. Left eye exhibits no discharge. No scleral icterus.  Neck: Normal range of motion. No JVD present. No tracheal deviation present.  Pulmonary/Chest: Effort normal. No stridor.  Abdominal:  Tenderness to palpation or right upper quadrant, remainder abdomen soft nontender  Neurological: She is alert and oriented to person, place, and time. Coordination normal.  Psychiatric: She has a normal mood and affect. Her behavior is normal. Judgment and thought content normal.  Nursing note and vitals reviewed.    ED Treatments / Results  Labs (all labs ordered are listed, but only abnormal results are displayed) Labs Reviewed  COMPREHENSIVE METABOLIC PANEL - Abnormal; Notable for the following components:      Result Value   Calcium 8.5 (*)    Total Protein 6.1 (*)    Albumin 3.3 (*)    AST 99 (*)    ALT 73 (*)    Alkaline Phosphatase 141 (*)    Total Bilirubin 1.9 (*)    All other components within normal limits  LIPASE,  BLOOD  CBC  URINALYSIS, ROUTINE W REFLEX MICROSCOPIC  HIV ANTIBODY (ROUTINE TESTING)  CBC  COMPREHENSIVE METABOLIC PANEL    EKG None  Radiology Koreas Abdomen Limited Ruq  Result Date: 04/19/2018 CLINICAL DATA:  RIGHT upper quadrant pain.  Known cholelithiasis. EXAM: ULTRASOUND ABDOMEN LIMITED RIGHT UPPER QUADRANT COMPARISON:  RIGHT upper quadrant ultrasound April 17, 2018 FINDINGS: Gallbladder: Multiple echogenic gallstones measuring to 5 mm with acoustic shadowing. Nonmobile stones at the neck are unchanged. Gallbladder wall thickening at 5 mm similar to slightly  increased from prior sonogram. No pericholecystic fluid. No sonographic Murphy sign elicited. Common bile duct: Diameter: 6 mm with echogenic stone in distal common bile duct (image 21). 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 No intrahepatic biliary dilatation. Within normal limits in parenchymal echogenicity. Portal vein is patent on color Doppler imaging with normal direction of blood flow towards the liver. IMPRESSION: 1. Cholelithiasis with similar nonmobile gallstones at the neck. Choledocholithiasis. 2. Similar to increased gallbladder wall thickening without corroborated findings of acute cholecystitis. Electronically Signed   By: Awilda Metroourtnay  Bloomer M.D.   On: 04/19/2018 17:09    Procedures Procedures (including critical care time)  Medications Ordered in ED Medications  oxyCODONE-acetaminophen (PERCOCET/ROXICET) 5-325 MG per tablet 1 tablet (has no administration in time range)  enoxaparin (LOVENOX) injection 30 mg (has no administration in time range)  lactated ringers infusion (has no administration in time range)  Ampicillin-Sulbactam (UNASYN) 3 g in sodium chloride 0.9 % 100 mL IVPB (has no administration in time range)  morphine 2 MG/ML injection 2 mg (has no administration in time range)  morphine 4 MG/ML injection 4 mg (4 mg Intramuscular Given 04/19/18 1544)     Initial Impression / Assessment and Plan / ED Course  I have reviewed the triage vital signs and the nursing notes.  Pertinent labs & imaging results that were available during my care of the patient were reviewed by me and considered in my medical decision making (see chart for details).     Labs: lipase, CMP, CBC  Imaging: Right upper quadrant ultrasound  Consults: Dr.  Lavon PaganiniNandigam, [employer]:  Discharge Meds:   Assessment/Plan: 37 year old female presents today with choledocholithiasis. She has no signs of acute cholecystitis, is well-appearing in no acute  distress. She is afebrile with no elevation in white count. She does have a lesion and LFTs and signs of a stone in the common bile duct. Case was discussed with on-call gastroenterologist who recommended clears diet now, nothing by mouth after midnight, with consultation in the morning  And pending ERCP. Patient will need general surgery consult while in the hospital no emergent need for bedside evaluation at this time.       Final Clinical Impressions(s) / ED Diagnoses   Final diagnoses:  RUQ pain  Choledocholithiasis    ED Discharge Orders    None       Rosalio LoudHedges, Alanis Clift, PA-C 04/19/18 1920    Virgina Norfolkuratolo, Adam, DO 04/19/18 2349

## 2018-04-19 NOTE — ED Notes (Signed)
Hospitalist at bedside 

## 2018-04-19 NOTE — ED Triage Notes (Signed)
Pt in c/o RUQ abdominal pain and  Chest pain, states she was seen at [facility] for same this weekend and dx with gallstones, pt states symptoms have continued and she called for her follow up and it would be a few days before they could see her so she came in for further evaluation

## 2018-04-19 NOTE — ED Triage Notes (Signed)
Last took vicodin two hours ago

## 2018-04-19 NOTE — ED Notes (Signed)
ED Provider at bedside. 

## 2018-04-20 ENCOUNTER — Other Ambulatory Visit: Payer: Self-pay

## 2018-04-20 ENCOUNTER — Encounter (HOSPITAL_COMMUNITY): Payer: Self-pay | Admitting: Certified Registered"

## 2018-04-20 ENCOUNTER — Encounter (HOSPITAL_COMMUNITY): Payer: Self-pay | Admitting: General Surgery

## 2018-04-20 LAB — URINALYSIS, ROUTINE W REFLEX MICROSCOPIC
Bacteria, UA: NONE SEEN
Bilirubin Urine: NEGATIVE
GLUCOSE, UA: NEGATIVE mg/dL
KETONES UR: NEGATIVE mg/dL
LEUKOCYTES UA: NEGATIVE
NITRITE: NEGATIVE
PH: 7 (ref 5.0–8.0)
PROTEIN: NEGATIVE mg/dL
RBC / HPF: >50 RBC/hpf — ABNORMAL HIGH (ref 0–5)
Specific Gravity, Urine: 1.005 (ref 1.005–1.030)

## 2018-04-20 LAB — COMPREHENSIVE METABOLIC PANEL
ALT: 70 U/L — AB (ref 0–44)
AST: 85 U/L — AB (ref 15–41)
Albumin: 2.7 g/dL — ABNORMAL LOW (ref 3.5–5.0)
Alkaline Phosphatase: 136 U/L — ABNORMAL HIGH (ref 38–126)
Anion gap: 5 (ref 5–15)
BUN: 6 mg/dL (ref 6–20)
CHLORIDE: 107 mmol/L (ref 98–111)
CO2: 26 mmol/L (ref 22–32)
CREATININE: 0.71 mg/dL (ref 0.44–1.00)
Calcium: 8.3 mg/dL — ABNORMAL LOW (ref 8.9–10.3)
GFR calc non Af Amer: >60 mL/min (ref >60)
Glucose, Bld: 86 mg/dL (ref 70–99)
Potassium: 3.6 mmol/L (ref 3.5–5.1)
Sodium: 138 mmol/L (ref 135–145)
Total Bilirubin: 1.3 mg/dL — ABNORMAL HIGH (ref 0.3–1.2)
Total Protein: 5.1 g/dL — ABNORMAL LOW (ref 6.5–8.1)

## 2018-04-20 LAB — CBC
HCT: 36.5 % (ref 36.0–46.0)
Hemoglobin: 11.8 g/dL — ABNORMAL LOW (ref 12.0–15.0)
MCH: 29.3 pg (ref 26.0–34.0)
MCHC: 32.3 g/dL (ref 30.0–36.0)
MCV: 90.6 fL (ref 78.0–100.0)
PLATELETS: 198 10*3/uL (ref 150–400)
RBC: 4.03 MIL/uL (ref 3.87–5.11)
RDW: 12.5 % (ref 11.5–15.5)
WBC: 4.9 10*3/uL (ref 4.0–10.5)

## 2018-04-20 LAB — MRSA PCR SCREENING: MRSA BY PCR: NEGATIVE

## 2018-04-20 LAB — HIV ANTIBODY (ROUTINE TESTING W REFLEX): HIV SCREEN 4TH GENERATION: NONREACTIVE

## 2018-04-20 MED ORDER — ENOXAPARIN SODIUM 30 MG/0.3ML ~~LOC~~ SOLN
30.0000 mg | SUBCUTANEOUS | Status: DC
  Administered 2018-04-20 – 2018-04-21 (×2): 30 mg via SUBCUTANEOUS
  Filled 2018-04-20 (×2): qty 0.3

## 2018-04-20 NOTE — H&P (View-Only) (Signed)
[facility] Consult  Referring Provider: Alessandra BevelsKamineni, Neelima, MD Primary Care Physician:  Clayborn Heronankins, Victoria R, MD Primary Gastroenterologist: [facility]  Reason for Consultation:  CBD stone  HPI: Kristy Hines is a 37 y.o. female was admitted on 04/19/18 with complains of epigastric abdominal pain. Patient states that she was in her usual state of health until a few days ago she developed sudden onset of excruciating chest pain( initially believe she was having a heart attack), after meals with radiation to the back and right shoulder. This was associated with nausea, no vomiting denies fever. She was seen at [facility] and reports having an ultrasound done which showed gallstones and a CBD stone, was discharged from the ER to follow up with surgery. patient reports recurrence of symptoms and [facility],found to have T bili 1.9, ALP 141, AST 99, AST 73, normal WBC and ultrasound showed multiple gallstones measuring 5 mm, non-mobile stones at May of gallbladder, gallbladder wall thickening at 5 mm, no pericholecystic fluid, 6 mm echogenic stone in distal common bile duct. Workup in ER showed a normal EKG and normal troponin I level. Lipase normal at 34, 37.   Past Medical History:  Diagnosis Date  . Gallstones     Past Surgical History:  Procedure Laterality Date  . LAPAROSCOPIC GASTRIC BANDING WITH HIATAL HERNIA REPAIR      Prior to Admission medications   Medication Sig Start Date End Date Taking? Authorizing Provider  SPRINTEC 28 0.25-35 MG-MCG tablet Take 1 tablet by mouth daily. 01/28/18  Yes [provider]  oxyCODONE-acetaminophen (PERCOCET) 5-325 MG tablet Take 1 tablet by mouth every 6 (six) hours as needed for severe pain. 04/17/18   Dionne BucySiadecki, Sebastian, MD    Current Facility-Administered Medications  Medication Dose Route Frequency Provider Last Rate Last Dose  . Ampicillin-Sulbactam (UNASYN) 3 g in sodium chloride 0.9 % 100 mL IVPB  3 g  Intravenous Q8H Kamineni, Sallye OberNeelima, MD 200 mL/hr at 04/20/18 0538 3 g at 04/20/18 0538  . enoxaparin (LOVENOX) injection 30 mg  30 mg Subcutaneous Q24H Kerin SalenKarki, Klohe Lovering, MD      . famotidine (PEPCID) IVPB 20 mg premix  20 mg Intravenous Q12H Alessandra BevelsKamineni, Neelima, MD 100 mL/hr at 04/20/18 0017 20 mg at 04/20/18 0017  . lactated ringers infusion   Intravenous Continuous Alessandra BevelsKamineni, Neelima, MD 100 mL/hr at 04/20/18 0014    . morphine 2 MG/ML injection 2 mg  2 mg Intravenous Q4H PRN Alessandra BevelsKamineni, Neelima, MD      . oxyCODONE-acetaminophen (PERCOCET/ROXICET) 5-325 MG per tablet 1 tablet  1 tablet Oral Q6H PRN Alessandra BevelsKamineni, Neelima, MD        Allergies as of 04/19/2018  . (No Known Allergies)    History reviewed. No pertinent family history.  Social History   Socioeconomic History  . Marital status: Single    Spouse name: Not on file  . Number of children: Not on file  . Years of education: Not on file  . Highest education level: Not on file  Occupational History  . Not on file  Social Needs  . Financial resource strain: Not on file  . Food insecurity:    Worry: Not on file    Inability: Not on file  . Transportation needs:    Medical: Not on file    Non-medical: Not on file  Tobacco Use  . Smoking status: Never Smoker  . Smokeless tobacco: Never Used  Substance and Sexual Activity  . Alcohol use: Not Currently  .  Drug use: Never  . Sexual activity: Yes  Lifestyle  . Physical activity:    Days per week: Not on file    Minutes per session: Not on file  . Stress: Not on file  Relationships  . Social connections:    Talks on phone: Not on file    Gets together: Not on file    Attends religious service: Not on file    Active member of club or organization: Not on file    Attends meetings of clubs or organizations: Not on file    Relationship status: Not on file  . Intimate partner violence:    Fear of current or ex partner: Not on file    Emotionally abused: Not on file    Physically  abused: Not on file    Forced sexual activity: Not on file  Other Topics Concern  . Not on file  Social History Narrative  . Not on file    Review of Systems: GI: Described in detail in HPI.    Gen: Denies any fever, chills, rigors, night sweats, anorexia, fatigue, weakness, malaise, involuntary weight loss, and sleep disorder CV:  chest pain,Denies angina, palpitations, syncope, orthopnea, PND, peripheral edema, and claudication. Resp: Denies dyspnea, cough, sputum, wheezing, coughing up blood. GU : Denies urinary burning, blood in urine, urinary frequency, urinary hesitancy, nocturnal urination, and urinary incontinence. MS: Denies joint pain or swelling.  Denies muscle weakness, cramps, atrophy.  Derm: Denies rash, itching, oral ulcerations, hives, unhealing ulcers.  Psych: Denies depression, anxiety, memory loss, suicidal ideation, hallucinations,  and confusion. Heme: Denies bruising, bleeding, and enlarged lymph nodes. Neuro:  Denies any headaches, dizziness, paresthesias. Endo:  Denies any problems with DM, thyroid, adrenal function.  Physical Exam: Vital signs in last 24 hours: Temp:  [97.5 F (36.4 C)-98.7 F (37.1 C)] 97.5 F (36.4 C) (08/20 0749) Pulse Rate:  [63-72] 64 (08/20 0749) Resp:  [16-20] 18 (08/20 0749) BP: (109-128)/(67-97) 115/86 (08/20 0749) SpO2:  [96 %-100 %] 97 % (08/20 0749)    General:   Alert,  Well-developed, well-nourished, pleasant and cooperative in NAD Head:  Normocephalic and atraumatic. Eyes:  Sclera clear, no icterus.   Conjunctiva pink. Ears:  Normal auditory acuity. Nose:  No deformity, discharge,  or lesions. Mouth:  No deformity or lesions.  Oropharynx pink & moist. Neck:  Supple; no masses or thyromegaly. Lungs:  Clear throughout to auscultation.   No wheezes, crackles, or rhonchi. No acute distress. Heart:  Regular rate and rhythm; no murmurs, clicks, rubs,  or gallops. Extremities:  Without clubbing or edema. Neurologic:  Alert  and  oriented x4;  grossly normal neurologically. Skin:  Intact without significant lesions or rashes. Psych:  Alert and cooperative. Normal mood and affect. Abdomen:  Soft, nontender and nondistended. No masses, hepatosplenomegaly or hernias noted. Normal bowel sounds, without guarding, and without rebound.         Lab Results: Recent Labs    04/17/18 1434 04/19/18 1538 04/20/18 0429  WBC 8.0 7.2 4.9  HGB 13.2 12.5 11.8*  HCT 37.9 39.5 36.5  PLT 223 227 198   BMET Recent Labs    04/17/18 1434 04/19/18 1538 04/20/18 0429  NA 139 140 138  K 3.8 4.3 3.6  CL 107 107 107  CO2 23 27 26   GLUCOSE 144* 93 86  BUN 18 11 6   CREATININE 0.84 0.96 0.71  CALCIUM 8.5* 8.5* 8.3*   LFT Recent Labs    04/17/18 1500  04/20/18 0429  PROT 6.6   < > 5.1*  ALBUMIN 3.6   < > 2.7*  AST 21   < > 85*  ALT 9   < > 70*  ALKPHOS 54   < > 136*  BILITOT 0.6   < > 1.3*  BILIDIR <0.1  --   --   IBILI NOT CALCULATED  --   --    < > = values in this interval not displayed.   PT/INR No results for input(s): LABPROT, INR in the last 72 hours.  Studies/Results: Koreas Abdomen Limited Ruq  Result Date: 04/19/2018 CLINICAL DATA:  RIGHT upper quadrant pain.  Known cholelithiasis. EXAM: ULTRASOUND ABDOMEN LIMITED RIGHT UPPER QUADRANT COMPARISON:  RIGHT upper quadrant ultrasound April 17, 2018 FINDINGS: Gallbladder: Multiple echogenic gallstones measuring to 5 mm with acoustic shadowing. Nonmobile stones at the neck are unchanged. Gallbladder wall thickening at 5 mm similar to slightly increased from prior sonogram. No pericholecystic fluid. No sonographic Murphy sign elicited. Common bile duct: Diameter: 6 mm with echogenic stone in distal common bile duct (image 21). 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 No intrahepatic biliary dilatation. Within normal limits in parenchymal echogenicity. Portal vein is patent on color Doppler imaging with normal direction of blood  flow towards the liver. IMPRESSION: 1. Cholelithiasis with similar nonmobile gallstones at the neck. Choledocholithiasis. 2. Similar to increased gallbladder wall thickening without corroborated findings of acute cholecystitis. Electronically Signed   By: Awilda Metroourtnay  Bloomer M.D.   On: 04/19/2018 17:09    Impression: CBD stone noted on ultrasound, gallstones and thickened gallbladder wall compatible with acute cholecystitis  Plan: ERCP tomorrow, the risks(infection, perforation, bleeding, pancreatitis, anesthesia complications, including death) and benefits of the procedure were explained to the patient in details. She understands the verbalizes consent. She is on IV Unasyn, Ringer's lactate at 100 mL per hour, morphine and Percocet for pain management. Start on clear liquid diet and plan to keep her nothing by mouth post midnight. Surgical consult has been placed, patient will need a cholecystectomy.   LOS: 1 day   Kerin SalenArya Tylah Mancillas, MD  04/20/2018, 9:57 AM  Pager [phone] If no answer or after 5 PM call [phone]

## 2018-04-20 NOTE — Consult Note (Signed)
Kristy Hines 1980-12-28  [id number].    Requesting MD: Dr. Verneita Griffes Chief Complaint/Reason for Consult: choledocholithiasis  HPI:  This is an otherwise healthy 37 yo white female who underwent a gastric sleeve at [facility] in November 2018.  She has lost 85lbs since then.  On Saturday she had her first episode of epigastric abdominal pain with some nausea and dry heaving.  She went to [facility] and was found to have cholelithiasis.  She was discharged home and had another attack on Sunday.  She then had another on Monday and came to the [facility] ED.  She had another [place] which revealed choledocholithiasis with some elevated LFTs and no evidence of cholecystitis.  She was admitted and GI has seen her for ERCP tomorrow at 1300.  We have been asked to see her for surgical evaluation as well.  ROS: ROS: Please see HPI, otherwise all other systems are negative.  She did admit to chest pain and shortness of breath related to her abdominal pain with burning, but no cardiac chest or pulmonary cause for SOB, just related to abdominal pain.  History reviewed. No pertinent family history.  Past Medical History:  Diagnosis Date  . Gallstones     Past Surgical History:  Procedure Laterality Date  . LAPAROSCOPIC GASTRIC SLEEVE RESECTION  07/2017    Social History:  reports that she has never smoked. She has never used smokeless tobacco. She reports that she drank alcohol. She reports that she does not use drugs.  Allergies: No Known Allergies  Medications Prior to Admission  Medication Sig Dispense Refill  . SPRINTEC 28 0.25-35 MG-MCG tablet Take 1 tablet by mouth daily.  3  . oxyCODONE-acetaminophen (PERCOCET) 5-325 MG tablet Take 1 tablet by mouth every 6 (six) hours as needed for severe pain. 10 tablet 0     Physical Exam: Blood pressure 115/86, pulse 64, temperature (!) 97.5 F (36.4 C), temperature source Oral, resp. rate 18, last menstrual period 04/19/2018, SpO2 97 %. General: pleasant,  WD, WN white female who is sitting on the edge of the bed in NAD HEENT: head is normocephalic, atraumatic.  Sclera are noninjected.  PERRL.  Ears and nose without any masses or lesions.  Mouth is pink and moist Heart: regular, rate, and rhythm.  Normal s1,s2. No obvious murmurs, gallops, or rubs noted.  Palpable radial and pedal pulses bilaterally Lungs: CTAB, no wheezes, rhonchi, or rales noted.  Respiratory effort nonlabored Abd: soft, NT, ND, +BS, no masses, hernias, or organomegaly MS: all 4 extremities are symmetrical with no cyanosis, clubbing, or edema. Skin: warm and dry with no masses, lesions, or rashes Psych: A&Ox3 with an appropriate affect.   Results for orders placed or performed during the hospital encounter of 04/19/18 (from the past 48 hour(s))  Lipase, blood     Status: None   Collection Time: 04/19/18  3:38 PM  Result Value Ref Range   Lipase 37 11 - 51 U/L    Comment: Performed at [facility], [address] [place], [place] [address]  Comprehensive metabolic panel     Status: Abnormal   Collection Time: 04/19/18  3:38 PM  Result Value Ref Range   Sodium 140 135 - 145 mmol/L   Potassium 4.3 3.5 - 5.1 mmol/L   Chloride 107 98 - 111 mmol/L   CO2 27 22 - 32 mmol/L   Glucose, Bld 93 70 - 99 mg/dL   BUN 11 6 - 20 mg/dL  Creatinine, Ser 0.96 0.44 - 1.00 mg/dL   Calcium 8.5 (L) 8.9 - 10.3 mg/dL   Total Protein 6.1 (L) 6.5 - 8.1 g/dL   Albumin 3.3 (L) 3.5 - 5.0 g/dL   AST 99 (H) 15 - 41 U/L   ALT 73 (H) 0 - 44 U/L   Alkaline Phosphatase 141 (H) 38 - 126 U/L   Total Bilirubin 1.9 (H) 0.3 - 1.2 mg/dL   GFR calc non Af Amer >60 >60 mL/min   GFR calc Af Amer >60 >60 mL/min    Comment: (NOTE) The eGFR has been calculated using the CKD EPI equation. This calculation has not been validated in all clinical situations. eGFR's persistently <60 mL/min signify possible Chronic Kidney Disease.    Anion gap 6 5 - 15    Comment: Performed at [facility] [address] [place] [address]  CBC     Status: None   Collection Time: 04/19/18  3:38 PM  Result Value Ref Range   WBC 7.2 4.0 - 10.5 K/uL   RBC 4.28 3.87 - 5.11 MIL/uL   Hemoglobin 12.5 12.0 - 15.0 g/dL   HCT 39.5 36.0 - 46.0 %   MCV 92.3 78.0 - 100.0 fL   MCH 29.2 26.0 - 34.0 pg   MCHC 31.6 30.0 - 36.0 g/dL   RDW 12.6 11.5 - 15.5 %   Platelets 227 150 - 400 K/uL    Comment: Performed at [facility] [address], [place], [place] [address]  CBC     Status: Abnormal   Collection Time: 04/20/18  4:29 AM  Result Value Ref Range   WBC 4.9 4.0 - 10.5 K/uL   RBC 4.03 3.87 - 5.11 MIL/uL   Hemoglobin 11.8 (L) 12.0 - 15.0 g/dL   HCT 36.5 36.0 - 46.0 %   MCV 90.6 78.0 - 100.0 fL   MCH 29.3 26.0 - 34.0 pg   MCHC 32.3 30.0 - 36.0 g/dL   RDW 12.5 11.5 - 15.5 %   Platelets 198 150 - 400 K/uL    Comment: Performed at [facility], [place] [address] [place], [place] [address]  Comprehensive metabolic panel     Status: Abnormal   Collection Time: 04/20/18  4:29 AM  Result Value Ref Range   Sodium 138 135 - 145 mmol/L   Potassium 3.6 3.5 - 5.1 mmol/L   Chloride 107 98 - 111 mmol/L   CO2 26 22 - 32 mmol/L   Glucose, Bld 86 70 - 99 mg/dL   BUN 6 6 - 20 mg/dL   Creatinine, Ser 0.71 0.44 - 1.00 mg/dL   Calcium 8.3 (L) 8.9 - 10.3 mg/dL   Total Protein 5.1 (L) 6.5 - 8.1 g/dL   Albumin 2.7 (L) 3.5 - 5.0 g/dL   AST 85 (H) 15 - 41 U/L   ALT 70 (H) 0 - 44 U/L   Alkaline Phosphatase 136 (H) 38 - 126 U/L   Total Bilirubin 1.3 (H) 0.3 - 1.2 mg/dL   GFR calc non Af Amer >60 >60 mL/min   GFR calc Af Amer >60 >60 mL/min    Comment: (NOTE) The eGFR has been calculated using the CKD EPI equation. This calculation has not been validated in all clinical situations. eGFR's persistently <60 mL/min signify possible Chronic Kidney Disease.    Anion gap 5 5 - 15    Comment: Performed at [facility] [address], [place], [place] [address]  MRSA PCR Screening     Status:  None   Collection Time: 04/20/18  5:36 AM  Result Value Ref Range   MRSA by PCR NEGATIVE NEGATIVE    Comment:        The GeneXpert MRSA Assay (FDA approved for NASAL specimens only), is one component of a comprehensive MRSA colonization surveillance program. It is not intended to diagnose MRSA infection nor to guide or monitor treatment for MRSA infections. Performed at [facility], [place] [address] [place], [place] [address]    US Abdomen Limited Ruq  Result Date: 04/19/2018 CLINICAL DATA:  RIGHT upper quadrant pain.  Known cholelithiasis. EXAM: ULTRASOUND ABDOMEN LIMITED RIGHT UPPER QUADRANT COMPARISON:  RIGHT upper quadrant ultrasound April 17, 2018 FINDINGS: Gallbladder: Multiple echogenic gallstones measuring to 5 mm with acoustic shadowing. Nonmobile stones at the neck are unchanged. Gallbladder wall thickening at 5 mm similar to slightly increased from prior sonogram. No pericholecystic fluid. No sonographic Murphy sign elicited. Common bile duct: Diameter: 6 mm with echogenic stone in distal common bile duct (image 21). 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 No intrahepatic biliary dilatation. Within normal limits in parenchymal echogenicity. Portal vein is patent on color Doppler imaging with normal direction of blood flow towards the liver. IMPRESSION: 1. Cholelithiasis with similar nonmobile gallstones at the neck. Choledocholithiasis. 2. Similar to increased gallbladder wall thickening without corroborated findings of acute cholecystitis. Electronically Signed   By: Elon Alas M.D.   On: 04/19/2018 17:09      Assessment/Plan Choledocholithiasis -plan for ERCP tomorrow with GI at 1300.  Will try and see if we can do a combined case and do the lap chole to immediately follow the ERCP.  If not, then we will have to proceed the following day -NPO p MN -will follow. -given no evidence of cholecystitis, doubt she needs  continuous abx therapy as opposed to just surgical prophylaxis  FEN - clears, NPO p MN VTE - SCDs/Lovenox ID - [place], [facility] Surgery 04/20/2018, 11:48 AM Pager: [phone]

## 2018-04-20 NOTE — Consult Note (Signed)
[facility] Consult  Referring Provider: Kamineni, Neelima, MD Primary Care Physician:  Rankins, Victoria R, MD Primary Gastroenterologist: [facility]  Reason for Consultation:  CBD stone  HPI: Kristy Hines is a 37 y.o. female was admitted on 04/19/18 with complains of epigastric abdominal pain. Patient states that she was in her usual state of health until a few days ago she developed sudden onset of excruciating chest pain( initially believe she was having a heart attack), after meals with radiation to the back and right shoulder. This was associated with nausea, no vomiting denies fever. She was seen at [place] [facility] and reports having an ultrasound done which showed gallstones and a CBD stone, was discharged from the ER to follow up with surgery. patient reports recurrence of symptoms and presented to [place] [facility],found to have T bili 1.9, ALP 141, AST 99, AST 73, normal WBC and ultrasound showed multiple gallstones measuring 5 mm, non-mobile stones at May of gallbladder, gallbladder wall thickening at 5 mm, no pericholecystic fluid, 6 mm echogenic stone in distal common bile duct. Workup in ER showed a normal EKG and normal troponin I level. Lipase normal at 34, 37.   Past Medical History:  Diagnosis Date  . Gallstones     Past Surgical History:  Procedure Laterality Date  . LAPAROSCOPIC GASTRIC BANDING WITH HIATAL HERNIA REPAIR      Prior to Admission medications   Medication Sig Start Date End Date Taking? Authorizing Provider  SPRINTEC 28 0.25-35 MG-MCG tablet Take 1 tablet by mouth daily. 01/28/18  Yes [provider]  oxyCODONE-acetaminophen (PERCOCET) 5-325 MG tablet Take 1 tablet by mouth every 6 (six) hours as needed for severe pain. 04/17/18   Siadecki, Sebastian, MD    Current Facility-Administered Medications  Medication Dose Route Frequency Provider Last Rate Last Dose  . Ampicillin-Sulbactam (UNASYN) 3 g in sodium chloride 0.9 % 100 mL IVPB  3 g  Intravenous Q8H Kamineni, Neelima, MD 200 mL/hr at 04/20/18 0538 3 g at 04/20/18 0538  . enoxaparin (LOVENOX) injection 30 mg  30 mg Subcutaneous Q24H Delynn Pursley, MD      . famotidine (PEPCID) IVPB 20 mg premix  20 mg Intravenous Q12H Kamineni, Neelima, MD 100 mL/hr at 04/20/18 0017 20 mg at 04/20/18 0017  . lactated ringers infusion   Intravenous Continuous Kamineni, Neelima, MD 100 mL/hr at 04/20/18 0014    . morphine 2 MG/ML injection 2 mg  2 mg Intravenous Q4H PRN Kamineni, Neelima, MD      . oxyCODONE-acetaminophen (PERCOCET/ROXICET) 5-325 MG per tablet 1 tablet  1 tablet Oral Q6H PRN Kamineni, Neelima, MD        Allergies as of 04/19/2018  . (No Known Allergies)    History reviewed. No pertinent family history.  Social History   Socioeconomic History  . Marital status: Single    Spouse name: Not on file  . Number of children: Not on file  . Years of education: Not on file  . Highest education level: Not on file  Occupational History  . Not on file  Social Needs  . Financial resource strain: Not on file  . Food insecurity:    Worry: Not on file    Inability: Not on file  . Transportation needs:    Medical: Not on file    Non-medical: Not on file  Tobacco Use  . Smoking status: Never Smoker  . Smokeless tobacco: Never Used  Substance and Sexual Activity  . Alcohol use: Not Currently  .   Drug use: Never  . Sexual activity: Yes  Lifestyle  . Physical activity:    Days per week: Not on file    Minutes per session: Not on file  . Stress: Not on file  Relationships  . Social connections:    Talks on phone: Not on file    Gets together: Not on file    Attends religious service: Not on file    Active member of club or organization: Not on file    Attends meetings of clubs or organizations: Not on file    Relationship status: Not on file  . Intimate partner violence:    Fear of current or ex partner: Not on file    Emotionally abused: Not on file    Physically  abused: Not on file    Forced sexual activity: Not on file  Other Topics Concern  . Not on file  Social History Narrative  . Not on file    Review of Systems: GI: Described in detail in HPI.    Gen: Denies any fever, chills, rigors, night sweats, anorexia, fatigue, weakness, malaise, involuntary weight loss, and sleep disorder CV:  chest pain,Denies angina, palpitations, syncope, orthopnea, PND, peripheral edema, and claudication. Resp: Denies dyspnea, cough, sputum, wheezing, coughing up blood. GU : Denies urinary burning, blood in urine, urinary frequency, urinary hesitancy, nocturnal urination, and urinary incontinence. MS: Denies joint pain or swelling.  Denies muscle weakness, cramps, atrophy.  Derm: Denies rash, itching, oral ulcerations, hives, unhealing ulcers.  Psych: Denies depression, anxiety, memory loss, suicidal ideation, hallucinations,  and confusion. Heme: Denies bruising, bleeding, and enlarged lymph nodes. Neuro:  Denies any headaches, dizziness, paresthesias. Endo:  Denies any problems with DM, thyroid, adrenal function.  Physical Exam: Vital signs in last 24 hours: Temp:  [97.5 F (36.4 C)-98.7 F (37.1 C)] 97.5 F (36.4 C) (08/20 0749) Pulse Rate:  [63-72] 64 (08/20 0749) Resp:  [16-20] 18 (08/20 0749) BP: (109-128)/(67-97) 115/86 (08/20 0749) SpO2:  [96 %-100 %] 97 % (08/20 0749)    General:   Alert,  Well-developed, well-nourished, pleasant and cooperative in NAD Head:  Normocephalic and atraumatic. Eyes:  Sclera clear, no icterus.   Conjunctiva pink. Ears:  Normal auditory acuity. Nose:  No deformity, discharge,  or lesions. Mouth:  No deformity or lesions.  Oropharynx pink & moist. Neck:  Supple; no masses or thyromegaly. Lungs:  Clear throughout to auscultation.   No wheezes, crackles, or rhonchi. No acute distress. Heart:  Regular rate and rhythm; no murmurs, clicks, rubs,  or gallops. Extremities:  Without clubbing or edema. Neurologic:  Alert  and  oriented x4;  grossly normal neurologically. Skin:  Intact without significant lesions or rashes. Psych:  Alert and cooperative. Normal mood and affect. Abdomen:  Soft, nontender and nondistended. No masses, hepatosplenomegaly or hernias noted. Normal bowel sounds, without guarding, and without rebound.         Lab Results: Recent Labs    04/17/18 1434 04/19/18 1538 04/20/18 0429  WBC 8.0 7.2 4.9  HGB 13.2 12.5 11.8*  HCT 37.9 39.5 36.5  PLT 223 227 198   BMET Recent Labs    04/17/18 1434 04/19/18 1538 04/20/18 0429  NA 139 140 138  K 3.8 4.3 3.6  CL 107 107 107  CO2 23 27 26  GLUCOSE 144* 93 86  BUN 18 11 6  CREATININE 0.84 0.96 0.71  CALCIUM 8.5* 8.5* 8.3*   LFT Recent Labs    04/17/18 1500  04/20/18 0429    PROT 6.6   < > 5.1*  ALBUMIN 3.6   < > 2.7*  AST 21   < > 85*  ALT 9   < > 70*  ALKPHOS 54   < > 136*  BILITOT 0.6   < > 1.3*  BILIDIR <0.1  --   --   IBILI NOT CALCULATED  --   --    < > = values in this interval not displayed.   PT/INR No results for input(s): LABPROT, INR in the last 72 hours.  Studies/Results: Us Abdomen Limited Ruq  Result Date: 04/19/2018 CLINICAL DATA:  RIGHT upper quadrant pain.  Known cholelithiasis. EXAM: ULTRASOUND ABDOMEN LIMITED RIGHT UPPER QUADRANT COMPARISON:  RIGHT upper quadrant ultrasound April 17, 2018 FINDINGS: Gallbladder: Multiple echogenic gallstones measuring to 5 mm with acoustic shadowing. Nonmobile stones at the neck are unchanged. Gallbladder wall thickening at 5 mm similar to slightly increased from prior sonogram. No pericholecystic fluid. No sonographic Murphy sign elicited. Common bile duct: Diameter: 6 mm with echogenic stone in distal common bile duct (image 21). 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 No intrahepatic biliary dilatation. Within normal limits in parenchymal echogenicity. Portal vein is patent on color Doppler imaging with normal direction of blood  flow towards the liver. IMPRESSION: 1. Cholelithiasis with similar nonmobile gallstones at the neck. Choledocholithiasis. 2. Similar to increased gallbladder wall thickening without corroborated findings of acute cholecystitis. Electronically Signed   By: Courtnay  Bloomer M.D.   On: 04/19/2018 17:09    Impression: CBD stone noted on ultrasound, gallstones and thickened gallbladder wall compatible with acute cholecystitis  Plan: ERCP tomorrow, the risks(infection, perforation, bleeding, pancreatitis, anesthesia complications, including death) and benefits of the procedure were explained to the patient in details. She understands the verbalizes consent. She is on IV Unasyn, Ringer's lactate at 100 mL per hour, morphine and Percocet for pain management. Start on clear liquid diet and plan to keep her nothing by mouth post midnight. Surgical consult has been placed, patient will need a cholecystectomy.   LOS: 1 day   Fed Ceci, MD  04/20/2018, 9:57 AM  Pager [phone] If no answer or after 5 PM call [phone] 

## 2018-04-20 NOTE — Progress Notes (Signed)
[facility] PROGRESS NOTE  ELEASHA CATALDO HYH:[id number] DOB: 1981-01-26 DOA: 04/19/2018 PCP: Aretta Nip, MD   Narrative: 64 fem, prior reflux, OSA class 3 obesity s/p Gastric sleeve called at [facility] presented with abdominal pain for the past 3 to 4 days Initially epigastric then with radiation-anorexia secondary to same-no overt vomiting, no diarrhea-T bili 1.9 alk phos 144 US abdomen pelvis suggested cholelithiasis at GB neck, 6 mm stone CBD   A & Plan Choledocholithiasis, cholelithiasis-appreciate GI input-ERCP a.m.-General surgery aware  Thyroid nodules-under observation at this time outpatient TSH needed  Prior gastric sleeve [facility]-has done well   Inpatient, await multiple procedures-likely can transfer to surgery service once cholelithiasis has been operated on    Wickliffe, MD  [facility] Direct contact: [phone] --Via amion app OR  --[email or web address]; password [email or web address]  7PM-7AM contact night coverage as above 04/20/2018, 9:43 AM  LOS: 1 day   Consultants:  General surgery  [facility]  Procedures:  None yet  Antimicrobials:  Unasyn  Interval history/Subjective: Wake pleasant no distress no abdominal pain No chest pain No fever  Objective:  Vitals:  Vitals:   04/20/18 0534 04/20/18 0749  BP: 109/67 115/86  Pulse: 63 64  Resp: 16 18  Temp: 98 F (36.7 C) (!) 97.5 F (36.4 C)  SpO2: 96% 97%    Exam:  . Caucasian female younger than stated age . No icterus no pallor . No thyromegaly no lymphadenopathy in submandibular region . Chest clinically clear . S1-S2 no murmur . Abdomen soft no Murphy sign . Neurologically intact   I have personally reviewed the following:   Labs:  Alk phos 141-->131, AST/ALT 99/73-->85/70, total bili 1.9-->1.3  Platelet 198 hemoglobin 11.8  Imaging studies:  n  Medical tests:  n   Test discussed with performing physician:  n  Decision to obtain old  records:  n  Review and summation of old records:  Y  Scheduled Meds: . enoxaparin (LOVENOX) injection  30 mg Subcutaneous Q24H   Continuous Infusions: . ampicillin-sulbactam (UNASYN) IV 3 g (04/20/18 0538)  . famotidine (PEPCID) IV 20 mg (04/20/18 0017)  . lactated ringers 100 mL/hr at 04/20/18 0014    Active Problems:   Choledocholithiasis   LOS: 1 day

## 2018-04-21 ENCOUNTER — Encounter (HOSPITAL_COMMUNITY): Payer: Self-pay | Admitting: *Deleted

## 2018-04-21 ENCOUNTER — Encounter (HOSPITAL_COMMUNITY)
Admission: EM | Disposition: A | Discharge status: Home / Self Care | Payer: Self-pay | Source: Home / Self Care | Attending: Family Medicine

## 2018-04-21 ENCOUNTER — Inpatient Hospital Stay (HOSPITAL_COMMUNITY): Payer: BLUE CROSS/BLUE SHIELD | Admitting: Anesthesiology

## 2018-04-21 ENCOUNTER — Inpatient Hospital Stay (HOSPITAL_COMMUNITY): Payer: BLUE CROSS/BLUE SHIELD

## 2018-04-21 HISTORY — PX: REMOVAL OF STONES: SHX5545

## 2018-04-21 HISTORY — PX: SPHINCTEROTOMY: SHX5544

## 2018-04-21 HISTORY — PX: CHOLECYSTECTOMY: SHX55

## 2018-04-21 HISTORY — PX: ERCP: SHX5425

## 2018-04-21 LAB — PREGNANCY, URINE: Preg Test, Ur: NEGATIVE

## 2018-04-21 SURGERY — LAPAROSCOPIC CHOLECYSTECTOMY
Anesthesia: General | Site: Abdomen

## 2018-04-21 SURGERY — ENDOSCOPIC RETROGRADE CHOLANGIOPANCREATOGRAPHY (ERCP) WITH PROPOFOL
Anesthesia: General

## 2018-04-21 SURGERY — ERCP, WITH INTERVENTION IF INDICATED
Anesthesia: General | Laterality: Left

## 2018-04-21 MED ORDER — MIDAZOLAM HCL 5 MG/5ML IJ SOLN
INTRAMUSCULAR | Status: DC | PRN
  Administered 2018-04-21: 2 mg via INTRAVENOUS

## 2018-04-21 MED ORDER — ROCURONIUM BROMIDE 50 MG/5ML IV SOSY
PREFILLED_SYRINGE | INTRAVENOUS | Status: AC
  Filled 2018-04-21: qty 5

## 2018-04-21 MED ORDER — SUGAMMADEX SODIUM 200 MG/2ML IV SOLN
INTRAVENOUS | Status: DC | PRN
  Administered 2018-04-21: 250 mg via INTRAVENOUS

## 2018-04-21 MED ORDER — BUPIVACAINE-EPINEPHRINE 0.25% -1:200000 IJ SOLN
INTRAMUSCULAR | Status: DC | PRN
  Administered 2018-04-21: 10 mL

## 2018-04-21 MED ORDER — EPHEDRINE SULFATE-NACL 50-0.9 MG/10ML-% IV SOSY
PREFILLED_SYRINGE | INTRAVENOUS | Status: DC | PRN
  Administered 2018-04-21: 10 mg via INTRAVENOUS

## 2018-04-21 MED ORDER — BUPIVACAINE-EPINEPHRINE (PF) 0.25% -1:200000 IJ SOLN
INTRAMUSCULAR | Status: AC
  Filled 2018-04-21: qty 30

## 2018-04-21 MED ORDER — INDOMETHACIN 50 MG RE SUPP
RECTAL | Status: AC
  Filled 2018-04-21: qty 2

## 2018-04-21 MED ORDER — INDOMETHACIN 50 MG RE SUPP
RECTAL | Status: DC | PRN
  Administered 2018-04-21: 100 mg via RECTAL

## 2018-04-21 MED ORDER — PROPOFOL 10 MG/ML IV BOLUS
INTRAVENOUS | Status: DC | PRN
  Administered 2018-04-21: 200 mg via INTRAVENOUS

## 2018-04-21 MED ORDER — PHENYLEPHRINE 40 MCG/ML (10ML) SYRINGE FOR IV PUSH (FOR BLOOD PRESSURE SUPPORT): PREFILLED_SYRINGE | INTRAVENOUS | Status: DC | PRN

## 2018-04-21 MED ORDER — DEXAMETHASONE SODIUM PHOSPHATE 10 MG/ML IJ SOLN
INTRAMUSCULAR | Status: DC | PRN
  Administered 2018-04-21: 10 mg via INTRAVENOUS

## 2018-04-21 MED ORDER — ROCURONIUM BROMIDE 10 MG/ML (PF) SYRINGE
PREFILLED_SYRINGE | INTRAVENOUS | Status: DC | PRN
  Administered 2018-04-21 (×2): 50 mg via INTRAVENOUS
  Administered 2018-04-21: 10 mg via INTRAVENOUS

## 2018-04-21 MED ORDER — IOPAMIDOL (ISOVUE-300) INJECTION 61%
INTRAVENOUS | Status: AC
  Filled 2018-04-21: qty 100

## 2018-04-21 MED ORDER — FENTANYL CITRATE (PF) 250 MCG/5ML IJ SOLN
INTRAMUSCULAR | Status: AC
  Filled 2018-04-21: qty 5

## 2018-04-21 MED ORDER — FENTANYL CITRATE (PF) 250 MCG/5ML IJ SOLN
INTRAMUSCULAR | Status: DC | PRN
  Administered 2018-04-21: 100 ug via INTRAVENOUS
  Administered 2018-04-21: 50 ug via INTRAVENOUS
  Administered 2018-04-21: 100 ug via INTRAVENOUS

## 2018-04-21 MED ORDER — LIDOCAINE 2% (20 MG/ML) 5 ML SYRINGE
INTRAMUSCULAR | Status: DC | PRN
  Administered 2018-04-21: 100 mg via INTRAVENOUS

## 2018-04-21 MED ORDER — SODIUM CHLORIDE 0.9 % IV SOLN
INTRAVENOUS | Status: DC | PRN
  Administered 2018-04-21: 20 mL

## 2018-04-21 MED ORDER — SODIUM CHLORIDE 0.9 % IR SOLN
Status: DC | PRN
  Administered 2018-04-21: 1000 mL

## 2018-04-21 MED ORDER — PROPOFOL 10 MG/ML IV BOLUS
INTRAVENOUS | Status: AC
  Filled 2018-04-21: qty 20

## 2018-04-21 MED ORDER — 0.9 % SODIUM CHLORIDE (POUR BTL) OPTIME
TOPICAL | Status: DC | PRN
  Administered 2018-04-21: 1000 mL

## 2018-04-21 MED ORDER — SUCCINYLCHOLINE CHLORIDE 200 MG/10ML IV SOSY
PREFILLED_SYRINGE | INTRAVENOUS | Status: DC | PRN
  Administered 2018-04-21: 100 mg via INTRAVENOUS

## 2018-04-21 MED ORDER — GLUCAGON HCL RDNA (DIAGNOSTIC) 1 MG IJ SOLR
INTRAMUSCULAR | Status: AC
  Filled 2018-04-21: qty 1

## 2018-04-21 MED ORDER — ONDANSETRON HCL 4 MG/2ML IJ SOLN
INTRAMUSCULAR | Status: DC | PRN
  Administered 2018-04-21: 4 mg via INTRAVENOUS

## 2018-04-21 MED ORDER — ONDANSETRON HCL 4 MG/2ML IJ SOLN
INTRAMUSCULAR | Status: AC
  Filled 2018-04-21: qty 2

## 2018-04-21 SURGICAL SUPPLY — 37 items
ADH SKN CLS APL DERMABOND .7 (GAUZE/BANDAGES/DRESSINGS) ×1
APPLIER CLIP 5 13 M/L LIGAMAX5 (MISCELLANEOUS) ×2
APR CLP MED LRG 5 ANG JAW (MISCELLANEOUS) ×1
BAG SPEC RTRVL 10 TROC 200 (ENDOMECHANICALS) ×1
BLADE CLIPPER SURG (BLADE) ×1 IMPLANT
CANISTER SUCT 3000ML PPV (MISCELLANEOUS) ×2 IMPLANT
CHLORAPREP W/TINT 26ML (MISCELLANEOUS) ×2 IMPLANT
CLIP APPLIE 5 13 M/L LIGAMAX5 (MISCELLANEOUS) ×1 IMPLANT
COVER SURGICAL LIGHT HANDLE (MISCELLANEOUS) ×2 IMPLANT
DERMABOND ADVANCED (GAUZE/BANDAGES/DRESSINGS) ×1
DERMABOND ADVANCED .7 DNX12 (GAUZE/BANDAGES/DRESSINGS) ×1 IMPLANT
DRSG TEGADERM 2-3/8X2-3/4 SM (GAUZE/BANDAGES/DRESSINGS) ×8 IMPLANT
ELECT REM PT RETURN 9FT ADLT (ELECTROSURGICAL) ×2
ELECTRODE REM PT RTRN 9FT ADLT (ELECTROSURGICAL) ×1 IMPLANT
GLOVE BIOGEL PI IND STRL 8 (GLOVE) ×1 IMPLANT
GLOVE BIOGEL PI INDICATOR 8 (GLOVE) ×1
GLOVE ECLIPSE 7.5 STRL STRAW (GLOVE) ×2 IMPLANT
GOWN STRL REUS W/ TWL LRG LVL3 (GOWN DISPOSABLE) ×3 IMPLANT
GOWN STRL REUS W/TWL LRG LVL3 (GOWN DISPOSABLE) ×6
KIT BASIN OR (CUSTOM PROCEDURE TRAY) ×2 IMPLANT
KIT TURNOVER KIT B (KITS) ×2 IMPLANT
NS IRRIG 1000ML POUR BTL (IV SOLUTION) ×2 IMPLANT
PAD ARMBOARD 7.5X6 YLW CONV (MISCELLANEOUS) ×2 IMPLANT
POUCH RETRIEVAL ECOSAC 10 (ENDOMECHANICALS) IMPLANT
POUCH RETRIEVAL ECOSAC 10MM (ENDOMECHANICALS) ×1
SCISSORS LAP 5X35 DISP (ENDOMECHANICALS) ×2 IMPLANT
SET IRRIG TUBING LAPAROSCOPIC (IRRIGATION / IRRIGATOR) ×2 IMPLANT
SLEEVE ENDOPATH XCEL 5M (ENDOMECHANICALS) ×4 IMPLANT
SPECIMEN JAR SMALL (MISCELLANEOUS) ×2 IMPLANT
STRIP CLOSURE SKIN 1/2X4 (GAUZE/BANDAGES/DRESSINGS) ×2 IMPLANT
SUT MNCRL AB 4-0 PS2 18 (SUTURE) ×2 IMPLANT
TOWEL GREEN STERILE FF (TOWEL DISPOSABLE) ×1 IMPLANT
TRAY LAPAROSCOPIC MC (CUSTOM PROCEDURE TRAY) ×2 IMPLANT
TROCAR XCEL BLUNT TIP 100MML (ENDOMECHANICALS) ×2 IMPLANT
TROCAR XCEL NON-BLD 5MMX100MML (ENDOMECHANICALS) ×2 IMPLANT
TUBING INSUFFLATION (TUBING) ×2 IMPLANT
WATER STERILE IRR 1000ML POUR (IV SOLUTION) ×2 IMPLANT

## 2018-04-21 NOTE — Brief Op Note (Signed)
04/19/2018 - 04/21/2018  1:41 PM  PATIENT:  Kristy Hines  37 y.o. female  PRE-OPERATIVE DIAGNOSIS:  CBD stone  POST-OPERATIVE DIAGNOSIS:  * No post-op diagnosis entered *  PROCEDURE:  Procedure(s): ENDOSCOPIC RETROGRADE CHOLANGIOPANCREATOGRAPHY (ERCP) (Left)  SURGEON:  Surgeon(s) and Role:    Ronnette Juniper, MD - Primary  PHYSICIAN ASSISTANT:   ASSISTANTS:Jennifer Kappus, RN, Alan Mulder , Tech    ANESTHESIA:   MAC  EBL:  Minimal  BLOOD ADMINISTERED:none  DRAINS: none   LOCAL MEDICATIONS USED:  NONE  SPECIMEN:  No Specimen  DISPOSITION OF SPECIMEN:  N/A  COUNTS:  YES  TOURNIQUET:  * No tourniquets in log *  DICTATION: .Dragon Dictation  PLAN OF CARE: Admit to inpatient   PATIENT DISPOSITION:  PACU - hemodynamically stable.   Delay start of Pharmacological VTE agent (>24hrs) due to surgical blood loss or risk of bleeding: no

## 2018-04-21 NOTE — Transfer of Care (Signed)
Immediate Anesthesia Transfer of Care Note  Patient: Reesa ChewWendy D Brown  Procedure(s) Performed: ENDOSCOPIC RETROGRADE CHOLANGIOPANCREATOGRAPHY (ERCP) (Left ) LAPAROSCOPIC CHOLECYSTECTOMY (N/A Abdomen)  Patient Location: PACU  Anesthesia Type:General  Level of Consciousness: drowsy  Airway & Oxygen Therapy: Patient Spontanous Breathing and Patient connected to nasal cannula oxygen  Post-op Assessment: Report given to RN and Post -op Vital signs reviewed and stable  Post vital signs: Reviewed and stable  Last Vitals:  Vitals Value Taken Time  BP 123/63 04/21/2018  3:28 PM  Temp    Pulse 56 04/21/2018  3:30 PM  Resp 14 04/21/2018  3:30 PM  SpO2 99 % 04/21/2018  3:30 PM  Vitals shown include unvalidated device data.  Last Pain:  Vitals:   04/21/18 1214  TempSrc: Oral  PainSc: 0-No pain         Complications: No apparent anesthesia complications

## 2018-04-21 NOTE — Interval H&P Note (Signed)
History and Physical Interval Note: 37/female with gallstones, acute cholecystitis and CBD stone noted on USG for an ERCP. 04/21/2018 1:01 PM  Kristy Hines  has presented today for ERCP with the diagnosis of CBD stone  The various methods of treatment have been discussed with the patient and family. After consideration of risks, benefits and other options for treatment, the patient has consented to  Procedure(s): ENDOSCOPIC RETROGRADE CHOLANGIOPANCREATOGRAPHY (ERCP) (Left) as a surgical intervention .  The patient's history has been reviewed, patient examined, no change in status, stable for surgery.  I have reviewed the patient's chart and labs.  Questions were answered to the patient's satisfaction.     Kerin SalenArya Elliona Doddridge

## 2018-04-21 NOTE — Op Note (Signed)
ERCP was performed for removal of CBD stone noted on ultrasound.  Findings: Mild antral erythema. Normal appearing ampulla. Deep cannulation performed, cholangiogram revealed a normal biliary tree, small filling defect noted in distal common bile duct. Sphincterotomy performed. Balloon sweep with 12 mm balloon performed. Removal of CBD stone accomplished. The pancreatic duct was never injected or cannulated during the entire procedure.   Recommendations: Patient scheduled for laparoscopic cholecystectomy today. Regular diet to be advanced thereafter as tolerated. PPI daily for 2 weeks, for antral erythema noted.   Kerin SalenArya Janyra Barillas, M.D.

## 2018-04-21 NOTE — Op Note (Signed)
OPERATIVE REPORT  DATE OF OPERATION:  04/21/2018  PATIENT:  Reesa ChewWendy D Brown  37 y.o. female  PRE-OPERATIVE DIAGNOSIS:  GALLSTONES  POST-OPERATIVE DIAGNOSIS:  GALLSTONES  INDICATION(S) FOR OPERATION:  Cholelithiasis and choledocholithiasis  FINDINGS:  Mild chronic cholecystitis and small gallstones  PROCEDURE:  Procedure(s): LAPAROSCOPIC CHOLECYSTECTOMY  SURGEON:  Surgeon(s): Jimmye NormanWyatt, Zelda Reames, MD  ASSISTANT: Focht, PA-C  ANESTHESIA:   general  COMPLICATIONS:  None  EBL: 10 ml  BLOOD ADMINISTERED: none  DRAINS: none   SPECIMEN:  Source of Specimen:  Gallbladder and contents  COUNTS CORRECT:  YES  PROCEDURE DETAILS: The patient was taken to the operating room and placed on the table in the supine position.  After an adequate endotracheal anesthetic was administered, the patient was prepped with ChloroPrep, and then draped in the usual manner exposing the entire abdomen laterally, inferiorly and up  to the costal margins.  After a proper timeout was performed including identifying the patient and the procedure to be performed, a infraumbilical 1.5cm midline incision was made using a #15 blade.  This was taken down to the fascia which was then incised with a #15 blade.  The edges of the fascia were tented up with Kocher clamps as the preperitoneal space was penetrated with a Kelly clamp into the peritoneum.  Once this was done, a pursestring suture of 0 Vicryl was passed around the fascial opening.  This was subsequently used to secure the [facility] which was passed into the peritoneal cavity.  Once the [facility] cannula was in place, carbon dioxide gas was insufflated into the peritoneal cavity up to a maximal intra-abdominal pressure of 15mm Hg.The laparoscope, with attached camera and light source, was passed into the peritoneal cavity to visualize the direct insertion of two right upper quadrant 5mm cannulas, and a sup-xiphoid 5mm cannula.  Once all cannulas were in place, the  dissection was begun.  Two ratcheted graspers were attached to the dome and infundibulum of the gallbladder and retracted towards the anterior abdominal wall and the right upper quadrant.  Using cautery attached to a dissecting forceps, the peritoneum overlaying the triangle of Chalot and the hepatoduodenal triangle was dissected away exposing the cystic duct and the cystic artery.  A critical window was developed between the CBD and the cystic duct The cystic artery was clipped proximally and distally then transected.  The patient had just finished getting an ERCP, so a cholangiogram was not performed.  A clip was placed on the gallbladder side of the cystic duct, then  the distal cystic duct was clipped three times then transected between the clips.  The gallbladder was then dissected out of the hepatic bed without event.  It was retrieved from the abdomen (using an EndoCatch bag) without event.  Once the gallbladder was removed, the bed was inspected for hemostasis.  Once excellent hemostasis was obtained all gas and fluids were aspirated from above the liver, then the cannulas were removed.  The infraumbilical incision was closed using the pursestring suture which was in place.  0.25% bupivicaine with epinephrine was injected at all sites.  All 10mm or greater cannula sites were close using a running subcuticular stitch of 4-0 Monocryl.  5.210mm cannula sites were closed with Dermabond only.Steri-Strips and Tagaderm were used to complete the dressings at all sites.  At this point all needle, sponge, and instrument counts were correct.The patient was awakened from anesthesia and taken to the PACU in stable condition.  Marta LamasJames O. Gae BonWyatt, III, MD, FACS [phone]--pager [phone]--office [facility]  PATIENT DISPOSITION:  PACU - hemodynamically stable.   Jimmye NormanJames Mercadez Heitman 8/21/20193:03 PM

## 2018-04-21 NOTE — Anesthesia Procedure Notes (Signed)
Procedure Name: Intubation Date/Time: 04/21/2018 1:10 PM Performed by: Adria Dillonkin, Dao Mearns A, CRNA Pre-anesthesia Checklist: Patient identified, Emergency Drugs available, Suction available and Patient being monitored Patient Re-evaluated:Patient Re-evaluated prior to induction Oxygen Delivery Method: Circle system utilized Preoxygenation: Pre-oxygenation with 100% oxygen Induction Type: IV induction Ventilation: Mask ventilation without difficulty Laryngoscope Size: Miller and 2 Grade View: Grade I Tube type: Oral Tube size: 7.0 mm Number of attempts: 1 Airway Equipment and Method: Stylet Placement Confirmation: ETT inserted through vocal cords under direct vision,  positive ETCO2,  CO2 detector and breath sounds checked- equal and bilateral Secured at: 21 cm Tube secured with: Tape Dental Injury: Teeth and Oropharynx as per pre-operative assessment

## 2018-04-21 NOTE — Op Note (Signed)
[facility] Patient Name: Kristy KindleWendy Hines Procedure Date : 04/21/2018 MRN: [id number] Attending MD: Kerin SalenArya Tin Engram , MD Date of Birth: May 12, 1981 CSN: [id number] Age: 89 Admit Type: Inpatient Procedure:                ERCP Indications:              Common bile duct stone(s) Providers:                Kerin SalenArya Kiana Hollar, MD, Tomma RakersJennifer Kappus, RN, Margo AyeValeria McKoy,                            Technician Referring MD:              Medicines:                Monitored Anesthesia Care Complications:            No immediate complications. Estimated blood loss:                            None Estimated Blood Loss:     Estimated blood loss was minimal. Procedure:                Pre-Anesthesia Assessment:                           - Prior to the procedure, a History and Physical                            was performed, and patient medications and                            allergies were reviewed. The patient's tolerance of                            previous anesthesia was also reviewed. The risks                            and benefits of the procedure and the sedation                            options and risks were discussed with the patient.                            All questions were answered, and informed consent                            was obtained. Prior Anticoagulants: The patient has                            taken no previous anticoagulant or antiplatelet                            agents. ASA Grade Assessment: III - A patient with                            severe systemic  disease. After reviewing the risks                            and benefits, the patient was deemed in                            satisfactory condition to undergo the procedure.                           After obtaining informed consent, the scope was                            passed under direct vision. Throughout the                            procedure, the patient's blood pressure, pulse, and                oxygen saturations were monitored continuously. The                            [id number]-Q180V ([id number]) Olympus Duodensocope was                            introduced through the mouth, and used to inject                            contrast into and used to inject contrast into the                            bile duct. The ERCP was accomplished without                            difficulty. The patient tolerated the procedure                            well. Scope In: Scope Out: Findings:      The scout film was normal. The esophagus was successfully intubated       under direct vision. The scope was advanced to a normal major papilla in       the descending duodenum without detailed examination of the pharynx,       larynx and associated structures, and upper GI tract. The upper GI tract       was grossly normal except mild antral erythema. The bile duct was deeply       cannulated with the sphincterotome. Contrast was injected. I personally       interpreted the bile duct images. There was brisk flow of contrast       through the ducts. Image quality was excellent. Contrast extended to the       entire biliary tree. The lower third of the main bile duct contained one       stone, which was 6 mm in diameter. The main bile duct was normal. A       straight Roadrunner wire was passed into the biliary tree. A 12 mm       biliary sphincterotomy was made with a braided  sphincterotome using ERBE       electrocautery. There was no post-sphincterotomy bleeding. The biliary       tree was swept with a 12 mm balloon starting at the bifurcation. One       stone was removed. No stones remained. Impression:               - Choledocholithiasis was found. Complete removal                            was accomplished by biliary sphincterotomy and                            balloon extraction.                           - A biliary sphincterotomy was performed.                           - The  biliary tree was swept.                           - MIld antral erythema. Moderate Sedation:      Patient did not receive moderate sedation for this procedure, but       instead received monitored anesthesia care. Recommendation:           - Refer to a surgeon scheduled for cholecystectomy                            today.                           - Advance diet as tolerated.                           - Use Protonix (pantoprazole) 40 mg PO daily for 2                            weeks. Procedure Code(s):        --- Professional ---                           [phone], Endoscopic retrograde                            cholangiopancreatography (ERCP); with removal of                            calculi/debris from biliary/pancreatic duct(s)                           43262, Endoscopic retrograde                            cholangiopancreatography (ERCP); with                            sphincterotomy/papillotomy  16109, Endoscopic catheterization of the biliary                            ductal system, radiological supervision and                            interpretation Diagnosis Code(s):        --- Professional ---                           K80.50, Calculus of bile duct without cholangitis                            or cholecystitis without obstruction CPT copyright 2017 American Medical Association. All rights reserved. The codes documented in this report are preliminary and upon coder review may  be revised to meet current compliance requirements. Kerin Salen, MD 04/21/2018 1:41:30 PM This report has been signed electronically. Number of Addenda: 0

## 2018-04-21 NOTE — Progress Notes (Signed)
We will sign off once surgery is done, as patient has sparse medical h/o and should do well with lap chole etc  Thanks Call directly if any q's  Pleas KochJai Shavona Gunderman, MD [facility] 7:47 AM

## 2018-04-21 NOTE — Anesthesia Preprocedure Evaluation (Addendum)
Anesthesia Evaluation  Patient identified by MRN, date of birth, ID band Patient awake    Reviewed: Allergy & Precautions, NPO status , Patient's Chart, lab work & pertinent test results  History of Anesthesia Complications Negative for: history of anesthetic complications  Airway Mallampati: II  TM Distance: >3 FB Neck ROM: Full    Dental  (+) Dental Advisory Given, Teeth Intact   Pulmonary neg pulmonary ROS,    breath sounds clear to auscultation       Cardiovascular negative cardio ROS   Rhythm:Regular Rate:Normal     Neuro/Psych negative neurological ROS  negative psych ROS   GI/Hepatic Neg liver ROS,  Gallstones S/p Gastric sleeve    Endo/Other  Morbid obesity  Renal/GU negative Renal ROS  negative genitourinary   Musculoskeletal negative musculoskeletal ROS (+)   Abdominal (+) + obese,   Peds  Hematology negative hematology ROS (+)   Anesthesia Other Findings   Reproductive/Obstetrics                            Anesthesia Physical Anesthesia Plan  ASA: III  Anesthesia Plan: General   Post-op Pain Management:    Induction: Intravenous  PONV Risk Score and Plan: 4 or greater and Treatment may vary due to age or medical condition, Ondansetron, Dexamethasone, Midazolam and Scopolamine patch - Pre-op  Airway Management Planned: Oral ETT  Additional Equipment: None  Intra-op Plan:   Post-operative Plan: Extubation in OR  Informed Consent: I have reviewed the patients History and Physical, chart, labs and discussed the procedure including the risks, benefits and alternatives for the proposed anesthesia with the patient or authorized representative who has indicated his/her understanding and acceptance.   Dental advisory given  Plan Discussed with: CRNA and Anesthesiologist  Anesthesia Plan Comments: (After ERCP, patient will be transported still intubated to operating  room for lap chole. Plan for extubation after lap chole.)       Anesthesia Quick Evaluation

## 2018-04-22 ENCOUNTER — Encounter (HOSPITAL_COMMUNITY): Payer: Self-pay | Admitting: Gastroenterology

## 2018-04-22 LAB — COMPREHENSIVE METABOLIC PANEL
ALK PHOS: 132 U/L — AB (ref 38–126)
ALT: 41 U/L (ref 0–44)
ANION GAP: 4 — AB (ref 5–15)
AST: 28 U/L (ref 15–41)
Albumin: 2.7 g/dL — ABNORMAL LOW (ref 3.5–5.0)
BUN: 6 mg/dL (ref 6–20)
CALCIUM: 8.6 mg/dL — AB (ref 8.9–10.3)
CO2: 29 mmol/L (ref 22–32)
CREATININE: 0.85 mg/dL (ref 0.44–1.00)
Chloride: 107 mmol/L (ref 98–111)
Glucose, Bld: 140 mg/dL — ABNORMAL HIGH (ref 70–99)
Potassium: 3.9 mmol/L (ref 3.5–5.1)
Sodium: 140 mmol/L (ref 135–145)
TOTAL PROTEIN: 5.3 g/dL — AB (ref 6.5–8.1)
Total Bilirubin: 0.5 mg/dL (ref 0.3–1.2)

## 2018-04-22 LAB — LIPASE, BLOOD: LIPASE: 32 U/L (ref 11–51)

## 2018-04-22 MED ORDER — OXYCODONE-ACETAMINOPHEN 5-325 MG PO TABS: 1.0000 | ORAL_TABLET | Freq: Four times a day (QID) | ORAL | 0 refills | Status: DC | PRN

## 2018-04-22 NOTE — Progress Notes (Signed)
Pt being discharged home via wheelchair with family. Pt alert and oriented x4. VSS. Pt c/o no pain at this time. No signs of respiratory distress. Education complete and care plans resolved. IV removed with catheter intact and pt tolerated well. No further issues at this time. Pt to follow up with PCP. Bashir Marchetti R, RN 

## 2018-04-22 NOTE — Progress Notes (Signed)
(  pt not seen)  Plans for dischg noted.    Please call if we can be of further assistance with this patient in the future.  Florencia Reasonsobert V. Keala Drum, M.D. Pager [phone] If no answer or after 5 PM call [phone]

## 2018-04-22 NOTE — Anesthesia Postprocedure Evaluation (Signed)
Anesthesia Post Note  Patient: Kristy Hines  Procedure(s) Performed: ENDOSCOPIC RETROGRADE CHOLANGIOPANCREATOGRAPHY (ERCP) (Left ) SPHINCTEROTOMY REMOVAL OF STONES LAPAROSCOPIC CHOLECYSTECTOMY (N/A Abdomen)     Patient location during evaluation: PACU Anesthesia Type: General Level of consciousness: awake and alert Pain management: pain level controlled Vital Signs Assessment: post-procedure vital signs reviewed and stable Respiratory status: spontaneous breathing, nonlabored ventilation and respiratory function stable Cardiovascular status: blood pressure returned to baseline and stable Postop Assessment: no apparent nausea or vomiting Anesthetic complications: no    Last Vitals:  Vitals:   04/22/18 0400 04/22/18 0837  BP: 102/64 (!) 115/50  Pulse:  62  Resp:    Temp: 36.5 C 36.7 C  SpO2:      Last Pain:  Vitals:   04/22/18 1200  TempSrc:   PainSc: 1                  Cecile HearingStephen Edward Turk

## 2018-04-22 NOTE — Discharge Summary (Signed)
[facility] Discharge Summary   Patient ID: Kristy Hines MRN: [id number] DOB/AGE: Sep 17, 1980 37 y.o.  Admit date: 04/19/2018 Discharge date: 04/22/2018  Admitting Diagnosis: Choledocholithiasis Atypical chest pain  Discharge Diagnosis Patient Active Problem List   Diagnosis Date Noted  . Choledocholithiasis 04/19/2018    Consultants GI Internal medicine   Imaging: Dg Ercp Biliary & Pancreatic Ducts  Result Date: 04/21/2018 CLINICAL DATA:  Choledocholithiasis. EXAM: ERCP TECHNIQUE: Multiple spot images obtained with the fluoroscopic device and submitted for interpretation post-procedure. FLUOROSCOPY TIME:  Fluoroscopy Time:  12 seconds Number of Acquired Spot Images: 2 COMPARISON:  Ultrasound 04/19/2018 FINDINGS: First image demonstrates cannulation and opacification of the extrahepatic biliary system. Limited evaluation for filling defects or stones. Second image demonstrates a balloon sweep for stone removal. There may be contrast within the gallbladder on the second image. IMPRESSION: Cannulation and opacification of the biliary system. Evidence for a balloon sweep for stone removal. These images were submitted for radiologic interpretation only. Please see the procedural report for the amount of contrast and the fluoroscopy time utilized. Electronically Signed   By: Richarda OverlieAdam  Henn M.D.   On: 04/21/2018 17:29    Procedures Dr. Marca AnconaKarki (04/21/18) - ERCP Dr. Lindie SpruceWyatt (04/21/18) - Laparoscopic Cholecystectomy  HPI: Kristy Hines is a 37 y.o. female with no significant past medical history except for undergoing gastric sleeve [facility] 2018 who presented with worsening abdominal pain.  Patient presented on Saturday to [facility] with complaints of right upper quadrant pain and chest discomfort which she describes as "shooting and burning pain" for which she underwent work-up revealing cholelithiasis with gallstone at the neck of the gallbladder.   She also underwent chest pain work-up with EKG and cardiac enzymes.  She was discharged home with instructions to follow-up general surgery as outpatient.  [facility] with worsening symptoms.  She denied any fever or jaundice or diarrhea.  She reported orange discoloration of urine.  She stated her abdominal pain was 8/10 on presentation and improved currently to 4 out of 10 after pain medications.  On presentation her pain was radiating to back and associated with nausea but no vomiting.  Work-up in the ED revealed elevated liver enzymes with total bili going up to 1.9 and alkalinephos at 144, no evidence of leukocytosis and ultrasonogram abdomen showed cholelithiasis/stone at gallbladder neck as well as 6 mm stone at the distal CBD.  Per ED physician GI recommended admission to hospitalist service for ERCP in a.m.   Hospital Course:  Patient was admitted to the hospitalist service then moved to [facility] service. Patient underwent procedures listed above. Tolerated procedures well and was transferred to the floor.  Diet was advanced as tolerated.  On POD#1, the patient was voiding well, tolerating diet, ambulating well, pain well controlled, vital signs stable, incisions c/d/i and felt stable for discharge home.  Patient will follow up in our office in 2 weeks and knows to call with questions or concerns.  She will call to confirm appointment date/time.   Patient was discharged in good condition.  The [place] Substance controlled database was reviewed prior to prescribing narcotic pain medication to this patient.  Physical Exam: General:  Alert, NAD, pleasant, cooperative Cardio: RRR, S1 & S2 normal, no murmur, rubs, gallops Resp: Effort normal, lungs CTA bilaterally, no wheezes, rales, rhonchi Abd:  Soft, ND, normal bowel sounds, very mild TTP, incisions C/D/I  Skin: no rashes noted, warm and dry Extremities: no pain, swelling,  or TTP of calves b/l  Allergies as of 04/22/2018   No  Known Allergies     Medication List    TAKE these medications   oxyCODONE-acetaminophen 5-325 MG tablet Commonly known as:  PERCOCET/ROXICET Take 1 tablet by mouth every 6 (six) hours as needed for severe pain.   SPRINTEC 28 0.25-35 MG-MCG tablet Generic drug:  norgestimate-ethinyl estradiol Take 1 tablet by mouth daily.       Follow-up Information    [facility], [place]. Call.   Specialty:  General Surgery Why:  we are working on a follow up appointment for you. Please call to seen when your appointment is. Please arrive prior to complete paperwork. Please bring photo ID and insurance card. Contact information: [address] [place] [address] [phone]           Signed: Joyce Copa [facility] 04/22/2018, 9:48 AM Pager: [phone] Consults: [phone] Mon-Fri 7:00 am-4:30 pm Sat-Sun 7:00 am-11:30 am

## 2018-04-22 NOTE — Discharge Instructions (Signed)
Please arrive at least 30 min before your appointment to complete your check in paperwork.  If you are unable to arrive 30 min prior to your appointment time we may have to cancel or reschedule you. ° °LAPAROSCOPIC SURGERY: POST OP INSTRUCTIONS  °1. DIET: Follow a light bland diet the first 24 hours after arrival home, such as soup, liquids, crackers, etc. Be sure to include lots of fluids daily. Avoid fast food or heavy meals as your are more likely to get nauseated. Eat a low fat the next few days after surgery.  °2. Take your usually prescribed home medications unless otherwise directed. °3. PAIN CONTROL:  °1. Pain is best controlled by a usual combination of three different methods TOGETHER:  °1. Ice/Heat °2. Over the counter pain medication °3. Prescription pain medication °2. Most patients will experience some swelling and bruising around the incisions. Ice packs or heating pads (30-60 minutes up to 6 times a day) will help. Use ice for the first few days to help decrease swelling and bruising, then switch to heat to help relax tight/sore spots and speed recovery. Some people prefer to use ice alone, heat alone, alternating between ice & heat. Experiment to what works for you. Swelling and bruising can take several weeks to resolve.  °3. It is helpful to take an over-the-counter pain medication regularly for the first few weeks. Choose one of the following that works best for you:  °1. Naproxen (Aleve, etc) Two 220mg tabs twice a day °2. Ibuprofen (Advil, etc) Three 200mg tabs four times a day (every meal & bedtime) °3. Acetaminophen (Tylenol, etc) 500-650mg four times a day (every meal & bedtime) °4. A prescription for pain medication (such as oxycodone, hydrocodone, etc) should be given to you upon discharge. Take your pain medication as prescribed.  °1. If you are having problems/concerns with the prescription medicine (does not control pain, nausea, vomiting, rash, itching, etc), please call us [phone] to see if we need to switch you to a different pain medicine that will work better for you and/or control your side effect better. °2. If you need a refill on your pain medication, please contact your pharmacy. They will contact our office to request authorization. Prescriptions will not be filled after 5 pm or on week-ends. °4. Avoid getting constipated. Between the surgery and the pain medications, it is common to experience some constipation. Increasing fluid intake and taking a fiber supplement (such as Metamucil, Citrucel, FiberCon, MiraLax, etc) 1-2 times a day regularly will usually help prevent this problem from occurring. A mild laxative (prune juice, Milk of Magnesia, MiraLax, etc) should be taken according to package directions if there are no bowel movements after 48 hours.  °5. Watch out for diarrhea. If you have many loose bowel movements, simplify your diet to bland foods & liquids for a few days. Stop any stool softeners and decrease your fiber supplement. Switching to mild anti-diarrheal medications (Kayopectate, Pepto Bismol) can help. If this worsens or does not improve, please call us. °6. Wash / shower every day. You may shower over the dressings as they are waterproof. Continue to shower over incision(s) after the dressing is off. °7. Remove your waterproof bandages 5 days after surgery. You may leave the incision open to air. You may replace a dressing/Band-Aid to cover the incision for comfort if you wish.  °8. ACTIVITIES as tolerated:  °1. You may resume regular (light) daily activities beginning the next day--such as daily self-care, walking, climbing stairs--gradually   increasing activities as tolerated. If you can walk 30 minutes without difficulty, it is safe to try more intense activity such as jogging, treadmill, bicycling, low-impact aerobics, swimming, etc. °2. Save the most intensive and strenuous activity for last such as sit-ups, heavy lifting, contact sports, etc Refrain  from any heavy lifting or straining until you are off narcotics for pain control.  °3. DO NOT PUSH THROUGH PAIN. Let pain be your guide: If it hurts to do something, don't do it. Pain is your body warning you to avoid that activity for another week until the pain goes down. °4. You may drive when you are no longer taking prescription pain medication, you can comfortably wear a seatbelt, and you can safely maneuver your car and apply brakes. °5. You may have sexual intercourse when it is comfortable.  °9. FOLLOW UP in our office  °1. Please call [facility] at [phone] to set up an appointment to see your surgeon in the office for a follow-up appointment approximately 2-3 weeks after your surgery. °2. Make sure that you call for this appointment the day you arrive home to insure a convenient appointment time. °     10. IF YOU HAVE DISABILITY OR FAMILY LEAVE FORMS, BRING THEM TO THE               OFFICE FOR PROCESSING.  ° °WHEN TO CALL US [phone]:  °1. Poor pain control °2. Reactions / problems with new medications (rash/itching, nausea, etc)  °3. Fever over 101.5 F (38.5 C) °4. Inability to urinate °5. Nausea and/or vomiting °6. Worsening swelling or bruising °7. Continued bleeding from incision. °8. Increased pain, redness, or drainage from the incision ° °The clinic staff is available to answer your questions during regular business hours (8:30am-5pm). Please don’t hesitate to call and ask to speak to one of our nurses for clinical concerns.  °If you have a medical emergency, go to the nearest emergency room or call 911.  °A surgeon from [facility] is always on call at the hospitals  ° °[facility]  °[address], [address], [place], [place] [address] ?  °MAIN: [phone] ? TOLL FREE: [phone] ?  °FAX [phone]  °[email or web address] ° °

## 2018-04-29 DIAGNOSIS — I87393 Chronic venous hypertension (idiopathic) with other complications of bilateral lower extremity: Secondary | ICD-10-CM | POA: Diagnosis not present

## 2018-06-03 DIAGNOSIS — I83813 Varicose veins of bilateral lower extremities with pain: Secondary | ICD-10-CM | POA: Diagnosis not present

## 2018-07-08 DIAGNOSIS — N912 Amenorrhea, unspecified: Secondary | ICD-10-CM | POA: Diagnosis not present

## 2018-07-08 DIAGNOSIS — Z3201 Encounter for pregnancy test, result positive: Secondary | ICD-10-CM | POA: Diagnosis not present

## 2018-07-15 DIAGNOSIS — Z3201 Encounter for pregnancy test, result positive: Secondary | ICD-10-CM | POA: Diagnosis not present

## 2018-07-15 DIAGNOSIS — Z3490 Encounter for supervision of normal pregnancy, unspecified, unspecified trimester: Secondary | ICD-10-CM | POA: Diagnosis not present

## 2018-07-15 LAB — OB RESULTS CONSOLE GC/CHLAMYDIA
Chlamydia: NEGATIVE
Gonorrhea: NEGATIVE

## 2018-07-15 LAB — OB RESULTS CONSOLE HIV ANTIBODY (ROUTINE TESTING): HIV: NONREACTIVE

## 2018-07-15 LAB — OB RESULTS CONSOLE RUBELLA ANTIBODY, IGM: Rubella: IMMUNE

## 2018-07-15 LAB — OB RESULTS CONSOLE HEPATITIS B SURFACE ANTIGEN: Hepatitis B Surface Ag: NEGATIVE

## 2018-08-10 DIAGNOSIS — Z23 Encounter for immunization: Secondary | ICD-10-CM | POA: Diagnosis not present

## 2018-08-10 DIAGNOSIS — O09511 Supervision of elderly primigravida, first trimester: Secondary | ICD-10-CM | POA: Diagnosis not present

## 2018-08-10 LAB — OB RESULTS CONSOLE GC/CHLAMYDIA
Chlamydia: NEGATIVE
Gonorrhea: NEGATIVE

## 2018-08-10 LAB — OB RESULTS CONSOLE HIV ANTIBODY (ROUTINE TESTING): HIV: NONREACTIVE

## 2018-08-10 LAB — OB RESULTS CONSOLE ABO/RH: RH Type: NEGATIVE

## 2018-08-10 LAB — OB RESULTS CONSOLE GBS: GBS: NEGATIVE

## 2018-08-10 LAB — OB RESULTS CONSOLE ANTIBODY SCREEN: Antibody Screen: NEGATIVE

## 2018-08-10 LAB — OB RESULTS CONSOLE RUBELLA ANTIBODY, IGM: Rubella: IMMUNE

## 2018-08-10 LAB — OB RESULTS CONSOLE HEPATITIS B SURFACE ANTIGEN: Hepatitis B Surface Ag: NEGATIVE

## 2018-08-10 LAB — OB RESULTS CONSOLE RPR: RPR: NONREACTIVE

## 2018-08-11 ENCOUNTER — Other Ambulatory Visit: Payer: Self-pay

## 2018-08-27 DIAGNOSIS — K912 Postsurgical malabsorption, not elsewhere classified: Secondary | ICD-10-CM | POA: Diagnosis not present

## 2018-08-27 DIAGNOSIS — Z9884 Bariatric surgery status: Secondary | ICD-10-CM | POA: Diagnosis not present

## 2018-08-27 DIAGNOSIS — Z79899 Other long term (current) drug therapy: Secondary | ICD-10-CM | POA: Diagnosis not present

## 2018-08-27 DIAGNOSIS — Z713 Dietary counseling and surveillance: Secondary | ICD-10-CM | POA: Diagnosis not present

## 2018-09-01 NOTE — L&D Delivery Note (Signed)
Operative Delivery Note At 4:46 PM a viable female was delivered via Vaginal, Vacuum Neurosurgeon).  Presentation: vertex; Position: Left,, Occiput,, Anterior; Station: +4.  Verbal consent: obtained from family.  Risks and benefits discussed in detail.  Risks include, but are not limited to the risks of anesthesia, bleeding, infection, damage to maternal tissues, fetal cephalhematoma.  There is also the risk of inability to effect vaginal delivery of the head, or shoulder dystocia that cannot be resolved by established maneuvers, leading to the need for emergency cesarean section.  APGAR: 4, 9; weight 7 lb 11.6 oz (3505 g).   Placenta status: to pathology Cord:  with the following complications: .  Cord pH: n/a  Anesthesia:  epidural Instruments: Bell  Episiotomy: None Lacerations: 3rd degree Suture Repair: 2.0 3.0 vicryl Est. Blood Loss (mL): 813  Bell applied and pt delivered with 1 pull.  Following delivery and repair, significant bleeding was noted.  Attempts were made to repair a left vaginal wall repair; however active bleeding was noted.  Plan was made to proceed to the OR for exam under anesthesia.  See operative note for further information  Annalee Genta 02/28/2019, 9:23 PM

## 2018-09-09 DIAGNOSIS — O09512 Supervision of elderly primigravida, second trimester: Secondary | ICD-10-CM | POA: Diagnosis not present

## 2018-09-17 ENCOUNTER — Other Ambulatory Visit (HOSPITAL_COMMUNITY): Payer: Self-pay | Admitting: Obstetrics & Gynecology

## 2018-09-17 DIAGNOSIS — Z36 Encounter for antenatal screening for chromosomal anomalies: Secondary | ICD-10-CM

## 2018-09-21 ENCOUNTER — Encounter (HOSPITAL_COMMUNITY): Payer: Self-pay

## 2018-09-27 ENCOUNTER — Encounter (HOSPITAL_COMMUNITY): Payer: Self-pay | Admitting: *Deleted

## 2018-09-28 ENCOUNTER — Ambulatory Visit (HOSPITAL_COMMUNITY)
Admission: RE | Admit: 2018-09-28 | Discharge: 2018-09-28 | Disposition: A | Discharge status: Home / Self Care | Payer: Medicaid Other | Source: Ambulatory Visit | Attending: Obstetrics & Gynecology | Admitting: Obstetrics & Gynecology

## 2018-09-28 ENCOUNTER — Encounter (HOSPITAL_COMMUNITY): Payer: Self-pay

## 2018-09-28 ENCOUNTER — Other Ambulatory Visit (HOSPITAL_COMMUNITY): Payer: Self-pay | Admitting: Obstetrics & Gynecology

## 2018-09-28 ENCOUNTER — Other Ambulatory Visit (HOSPITAL_COMMUNITY): Payer: Self-pay | Admitting: *Deleted

## 2018-09-28 DIAGNOSIS — Z36 Encounter for antenatal screening for chromosomal anomalies: Secondary | ICD-10-CM | POA: Insufficient documentation

## 2018-09-28 DIAGNOSIS — O09512 Supervision of elderly primigravida, second trimester: Secondary | ICD-10-CM

## 2018-09-28 DIAGNOSIS — O99842 Bariatric surgery status complicating pregnancy, second trimester: Secondary | ICD-10-CM | POA: Diagnosis not present

## 2018-09-28 DIAGNOSIS — O360920 Maternal care for other rhesus isoimmunization, second trimester, not applicable or unspecified: Secondary | ICD-10-CM | POA: Diagnosis not present

## 2018-09-28 DIAGNOSIS — Z363 Encounter for antenatal screening for malformations: Secondary | ICD-10-CM | POA: Diagnosis not present

## 2018-09-28 DIAGNOSIS — O09519 Supervision of elderly primigravida, unspecified trimester: Secondary | ICD-10-CM

## 2018-09-28 DIAGNOSIS — Z3A19 19 weeks gestation of pregnancy: Secondary | ICD-10-CM

## 2018-09-28 HISTORY — DX: Nontoxic multinodular goiter: E04.2

## 2018-09-28 NOTE — Consult Note (Signed)
Maternal-Fetal Medicine  Name: Kristy BirchwoodWendy Hines MRN: [id number] Requesting Provider: Myna HidalgoJennifer Ozan, MD  I had the pleasure of seeing Ms. Kristy FiremanHayes Nicki Hines(AKA Kristy Hines) today at [facility]. She is a G1 P0 at 19-weeks' gestation and is here for fetal anatomy scan and consultation. Her problems include: -Advanced maternal age. 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  Patient had bariatric surgery (gastric sleeve) in 07/2017 and, after surgery, she lost about 90 lbs. Patient also reports she had blood drawn check for micronutrients deficiency and was reported as normal. She takes bariatric vitamin supplementations advised by the team. She did not have any complications from surgery. She reports occasional abdominal discomfort, but no abdominal pain.  On routine screening, anti-E titers were increased at 1:4 and anti-C (large) antibodies were also detected, but too weak to titer. Patient does not have a history of miscarriage or termination of pregnancy. She also did not have blood transfusions. Her blood type is O negative and anti-D antibodies are not found.  She does not have hypertension or diabetes or any other chronic medical condtions.  PSH: Bariatric surgery (2018), laparoscopic cholecystectomy (04/2018). Medications: Multivitamins. Allergies: NKDA. Social: Denies tobacco or drug or alcohol use. She has been married since Dec 2019 and her husband is in good health. He has 3 children from previous relationship and all are in good health. Family: No history of venous thromboembolism in the family. Father had MI and is in good health now. Gyn: No history of abnormal Pap smears or cervical surgeries. No history of breast disease. Her previous menstrual cycles were regular.  ROS: No headaches or visual disturbances. No nausea or vomiting or diarrhea. No chest pain or palpitations. No abdominal pain or joint pains. No recurrent UTIs or vaginal  bleeding.  Prenatal course: On cell-free fetal DNA screening, the risks of aneuploidies were not increased. MSAFP screening showed low risk for open-neural tube defects.  Ultrasound:  We performed a fetal anatomy scan. No markers of aneuploidies or fetal structural defects are seen. Fetal biometry is consistent with her previously-established dates. Amniotic fluid is normal and good fetal activity is seen. Patient understands the limitations of ultrasound in detecting fetal anomalies.    I counseled the couple on the following: History of bariatric surgery: Bariatric surgery improves pregnancy outcomes. Complications including preeclampsia and gestational diabetes, and in some studies, cesarean delivery and preterm delivery rates are decreased. Overall, newborn birth weights tend to be lower in women who had bariatric surgery. However, fetal growth restriction is not common.   Nutritional deficiencies are more common and vitamin D, folic acid, vitamin B12, iron, ferritin levels are to be checked every trimester and corrected as necessary. No additional caloric restriction is recommended in pregnancy and increased protein intake should be encouraged.   Patients who underwent bariatric surgery can experience dumping syndrome and may not be able to tolerate glucola screening for diabetes. Alternatively, fasting and postprandial blood glucose may be checked on several occasions to rule out GDM. Patient may also be given the option of self-blood glucose monitoring.  Complications of bariatric surgery are rare, but can occur during pregnancy. Symptoms of complications may be masked and there may be a delay in the diagnosis. Complications include intestinal obstruction, ventral and internal hernias, gastrointestinal hemorrhage and anastomotic leaks. Close follow-up of her symptom of abdominal pain is recommended.  Generally, pregnancy should be avoided for 12 to 24 months after surgery for effects of  weight loss. However, our patient already  lost about 90 lbs after surgery.  I reassured the patient that we should expect good pregnancy outcome. Vaginal delivery is not contraindicated.  Anti-E and anti-C antibodies: I explained that E and C antigens are part of rhesus system. I also explained the genetics. I am unable to ascertain how she acquired the antibodies in the absence of previous pregnancy or blood transfusion or early pregnancy bleeding. Unlike anti-D, although hemolytic disease of fetus and newborn (HDFN) can occur with anti-E and anti-C antibodies, they are not frequent. I explained surveillance by non-invasive Doppler study of middle-cerebral artery (MCA) to detect fetal anemia.   I reassured her that antibody titers are low now and is unlikely to cause hemolysis. I recommend repeating titers in 4 weeks.   Anti-C antibodies can also cause HDFN, but the titers are too low and I reassured her.  Paternal genotype for E and C antigen can be performed and I recommend blood (husband) may be sent for genotyping. If he is negative for E and C antigen, the fetus is likely to be E and C antigens negative and, therefore, no MCA Doppler surveillance is necessary.  Advanced maternal age:  I informed that AMA is associated with an increase in fetal aneuploidies. I discussed available screening methods. I also discussed cell-free fetal DNA screening that analyzes fetal DNA in maternal blood for trisomies 21, 18 and 13, and, which has a greater detection rate than conventional screening tests. I informed the patient that not all chromosomal malformations are detected by this test. I informed her that only invasive tests including chorion villus sampling or amniocentesis will give a definitive result on the fetal karyotype. I informed her that pregnancy loss from the procedure is about 1 in 400. Patient opted not to have amniocentesis.  AMA>40: I also informed the patient that in women over 40 years of  age, there is a small increase in stillbirth rates (absolute number is very small) late in pregnancy. We, therefore, recommend weekly antenatal testing from 36 weeks until delivery.  Recommendations: -An appointment was made for her to return in 4 weeks for fetal growth assessment and MCA Doppler studies. -Serial fetal growth assessments every 4 weeks. -Weekly antenatal testing from 36 weeks till delivery. -Paternal genotyping for E and C antigen ([facility]) to be sent. -Serial MCA Doppler studies if her husband has E and/or C antigens, or if his antigen status is not known. -Check micronutrients and nutrition supplements as needed.  Thank you for your consult. Please do not hesitate to contact me if you have any questions or concerns.  Consultation including face-to-face counseling: 45 min.

## 2018-10-28 ENCOUNTER — Encounter (HOSPITAL_COMMUNITY): Payer: Self-pay

## 2018-10-28 ENCOUNTER — Ambulatory Visit (HOSPITAL_COMMUNITY): Payer: BLUE CROSS/BLUE SHIELD

## 2018-11-25 LAB — OB RESULTS CONSOLE RPR: RPR: NONREACTIVE

## 2019-01-27 LAB — OB RESULTS CONSOLE GBS: GBS: NEGATIVE

## 2019-02-15 ENCOUNTER — Encounter (HOSPITAL_COMMUNITY): Payer: Self-pay | Admitting: *Deleted

## 2019-02-15 ENCOUNTER — Telehealth (HOSPITAL_COMMUNITY): Payer: Self-pay | Admitting: *Deleted

## 2019-02-15 NOTE — Telephone Encounter (Signed)
Preadmission screen  

## 2019-02-16 ENCOUNTER — Encounter (HOSPITAL_COMMUNITY): Payer: Self-pay | Admitting: *Deleted

## 2019-02-24 ENCOUNTER — Other Ambulatory Visit (HOSPITAL_COMMUNITY)
Admission: RE | Admit: 2019-02-24 | Discharge: 2019-02-24 | Disposition: A | Discharge status: Home / Self Care | Payer: Medicaid Other | Source: Ambulatory Visit | Attending: Obstetrics & Gynecology | Admitting: Obstetrics & Gynecology

## 2019-02-24 ENCOUNTER — Other Ambulatory Visit: Payer: Self-pay

## 2019-02-24 DIAGNOSIS — Z1159 Encounter for screening for other viral diseases: Secondary | ICD-10-CM | POA: Insufficient documentation

## 2019-02-24 LAB — SARS CORONAVIRUS 2 (TAT 6-24 HRS): SARS Coronavirus 2: NEGATIVE

## 2019-02-24 NOTE — MAU Note (Signed)
Swab collected without difficulty. 

## 2019-02-27 NOTE — H&P (Signed)
HPI: 38 y/o G1P0 @ 101w6d estimated gestational age (as dated by LMP c/w 20 week ultrasound) presents for IOL.   no Leaking of Fluid,   no Vaginal Bleeding,   no Uterine Contractions,  + Fetal Movement.  Prenatal care has been provided by Dr. Nelda Marseille  ROS: no HA, no epigastric pain, no visual changes.    Pregnancy complicated by: 1) AMA- low risk female, normal anatomy scan 2) anti C&E antibodies weak titer, FOB negative S/p MFM consult 3) h/o Gastric bypass 4) Obesity- BMI: 38  Last [place] @ [redacted]w[redacted]d (6/18)- vertex/fundal/EFW: 7#8oz (80%)   Prenatal Transfer Tool  Maternal Diabetes: No Genetic Screening: Normal Maternal Ultrasounds/Referrals: Normal Fetal Ultrasounds or other Referrals:  None, Referred to Materal Fetal Medicine  Maternal Substance Abuse:  No Significant Maternal Medications:  None Significant Maternal Lab Results: Group B Strep negative   PNL:  GBS neg, Rub Immune, Hep B neg, RPR NR, HIV neg, GC/C neg, glucola:132 Hgb: 11.5 Blood type: O neg  Immunizations: Tdap: 5/14 Flu: 12/10  OBHx: primip PMHx:  obesity Meds:  PNV Allergy:  No Known Allergies SurgHx: gastric bypass SocHx:   no Tobacco, no  EtOH, no Illicit Drugs  O: LMP 70/48/8891  to be obtained on arrival BMI: 38  Gen. AAOx3, NAD CV.  RRR  No murmur.  Resp. CTAB, no wheeze or crackles. Abd. Gravid,  no tenderness,  no rigidity,  no guarding Extr.  1+ edema B/L , no calf tenderness, neg Homan's B/L  FHT: 140 by doppler   Labs: see orders  A/P:  38 y.o. G1P0 @ [redacted]w[redacted]d EGA who presents for IOL due to postdate/full term pregnancy -FWB: reassuring by doppler -Labor: cytotec per protocol -GBS: negative -Pain management: IV or epidural upon request  Janyth Pupa, DO [phone] (cell) [phone] (office)

## 2019-02-27 NOTE — H&P (Signed)
Kristy HolterWendy D Hines is a [age] y.o. female, Dr Charlotta Newtonzan pt of [facility], G1P0000, IUP at 40.6 weeks, presenting for IOL for postdates. H/O AMA. H/O gastric bypass 2019. Prenatal Labs: O-, rhogam given @ 28 weeks, antibody C7E weak titers, FOB testing was neg, RPR NR, HIV-, Hep B-, Rub-imm, HGB 12.4 1st tri, 11.5 @ 28 weeks, glucola 132. 20 weeks anatomy scan completed with anterior placenta. . Low risk Female. UTD on TDAP and Flu. Last growth was @ Last US @ 5854w2d (6/18)- vertex/fundal/EFW: 7#8oz (80%).  Pt endorse + Fm. Denies vaginal leakage. Denies vaginal bleeding. Denies feeling cxt's.   Patient Active Problem List   Diagnosis Date Noted  . Choledocholithiasis 04/19/2018    No medications prior to admission.    Past Medical History:  Diagnosis Date  . Gallstones   . Multiple thyroid nodules      No current facility-administered medications on file prior to encounter.    Current Outpatient Medications on File Prior to Encounter  Medication Sig Dispense Refill  . Multiple Vitamin (MULTIVITAMIN) tablet Take 1 tablet by mouth daily.    Marland Kitchen. oxyCODONE-acetaminophen (PERCOCET) 5-325 MG tablet Take 1 tablet by mouth every 6 (six) hours as needed for severe pain. (Patient not taking: Reported on 09/28/2018) 15 tablet 0  . SPRINTEC 28 0.25-35 MG-MCG tablet Take 1 tablet by mouth daily.  3     No Known Allergies  OB History    Gravida  1   Para      Term      Preterm      AB      Living  0     SAB      TAB      Ectopic      Multiple      Live Births             Past Medical History:  Diagnosis Date  . Gallstones   . Multiple thyroid nodules    Past Surgical History:  Procedure Laterality Date  . CHOLECYSTECTOMY N/A 04/21/2018   Procedure: LAPAROSCOPIC CHOLECYSTECTOMY;  Surgeon: Jimmye NormanWyatt, James, MD;  [facility];  Service: General;  Laterality: N/A;  . ERCP Left 04/21/2018   Procedure: ENDOSCOPIC RETROGRADE CHOLANGIOPANCREATOGRAPHY (ERCP);  Surgeon: Kerin SalenKarki, Arya, MD;   [facility];  Service: Gastroenterology;  Laterality: Left;  . LAPAROSCOPIC GASTRIC SLEEVE RESECTION  07/2017  . REMOVAL OF STONES  04/21/2018   Procedure: REMOVAL OF STONES;  Surgeon: Kerin SalenKarki, Arya, MD;  [facility];  Service: Gastroenterology;;  . Dennison MascotSPHINCTEROTOMY  04/21/2018   Procedure: SPHINCTEROTOMY;  Surgeon: Kerin SalenKarki, Arya, MD;  [facility];  Service: Gastroenterology;;   Family History: family history includes Cancer in her maternal grandfather; Heart disease in her father; Hyperlipidemia in her father; Hypertension in her father and mother; Leukemia in her maternal aunt. Social History:  reports that she has never smoked. She has never used smokeless tobacco. She reports previous alcohol use. She reports that she does not use drugs.   Prenatal Transfer Tool  Maternal Diabetes: No Genetic Screening: Normal Maternal Ultrasounds/Referrals: Normal Fetal Ultrasounds or other Referrals:  Referred to Materal Fetal Medicine  Maternal Substance Abuse:  No Significant Maternal Medications:  None Significant Maternal Lab Results: Group B Strep negative, Rh negative and Other: low titers for ant C&E, FOB neg.   ROS:  Review of Systems  Constitutional: Negative.   HENT: Negative.   Eyes: Negative.   Respiratory: Negative.   Cardiovascular: Negative.   Gastrointestinal: Negative.  Genitourinary: Negative.   Musculoskeletal: Negative.   Skin: Negative.   Neurological: Negative.   Endo/Heme/Allergies: Negative.   Psychiatric/Behavioral: Negative.   All other systems reviewed and are negative.    Physical Exam: LMP 05/26/2018   Physical Exam  Constitutional: She is oriented to person, place, and time and well-developed, well-nourished, and in no distress.  HENT:  Head: Normocephalic and atraumatic.  Eyes: Pupils are equal, round, and reactive to light. Conjunctivae are normal.  Neck: Normal range of motion. Neck supple.  Cardiovascular: Normal rate and  regular rhythm.  Pulmonary/Chest: Effort normal and breath sounds normal.  Abdominal: Soft. Bowel sounds are normal.  Genitourinary:    Genitourinary Comments: Uterus soft non-tender, gravida equal to dates, pelvis adequate for vaginal delivery    Musculoskeletal: Normal range of motion.  Neurological: She is alert and oriented to person, place, and time. She has normal reflexes. Gait normal.  Skin: Skin is warm and dry.  Psychiatric: Affect normal.  Nursing note and vitals reviewed.    FHT 140 by doppler vis Dr Nelda Marseille records.   Labs: No results found for this or any previous visit (from the past 24 hour(s)).  Imaging:  No results found.  MAU Course: No orders of the defined types were placed in this encounter.  No orders of the defined types were placed in this encounter.   Assessment/Plan: Kristy Hines is a 38 y.o. female, Dr Nelda Marseille pt of [facility], G1P0000, IUP at 40.6 weeks, presenting for IOL for postdates. H/O AMA. H/O gastric bypass 2019. Prenatal Labs: O-, rhogam given @ 28 weeks, antibody C7E weak titers, FOB testing was neg, RPR NR, HIV-, Hep B-, Rub-imm, HGB 12.4 1st tri, 11.5 @ 28 weeks, glucola 132. 20 weeks anatomy scan completed with anterior placenta. . Low risk Female. UTD on TDAP and Flu. Last growth was @ 30.2 with anterior placenta, vertex, efw 3.6lbs (51%).  Pt endorse + Fm. Denies vaginal leakage. Denies vaginal bleeding. Denies feeling cxt's.   FWB: Cat 1 Fetal Tracing.   Plan: Admit to [place] per consult with Dr Nelda Marseille Routine [facility] orders Pain med/epidural prn Induction to start with Cytotec.  Anticipate labor progression   Noralyn Pick NP-C, CNM, MSN 02/27/2019, 12:35 PM

## 2019-02-28 ENCOUNTER — Inpatient Hospital Stay (HOSPITAL_COMMUNITY): Payer: Medicaid Other | Admitting: Anesthesiology

## 2019-02-28 ENCOUNTER — Encounter (HOSPITAL_COMMUNITY)
Admission: AD | Disposition: A | Discharge status: Home / Self Care | Payer: Self-pay | Source: Home / Self Care | Attending: Obstetrics & Gynecology

## 2019-02-28 ENCOUNTER — Encounter (HOSPITAL_COMMUNITY): Payer: Self-pay

## 2019-02-28 ENCOUNTER — Inpatient Hospital Stay (HOSPITAL_COMMUNITY): Payer: Medicaid Other

## 2019-02-28 ENCOUNTER — Other Ambulatory Visit: Payer: Self-pay

## 2019-02-28 ENCOUNTER — Inpatient Hospital Stay (HOSPITAL_COMMUNITY)
Admission: AD | Admit: 2019-02-28 | Discharge: 2019-03-03 | DRG: 768 | Disposition: A | Discharge status: Home / Self Care | Payer: Medicaid Other | Attending: Obstetrics & Gynecology | Admitting: Obstetrics & Gynecology

## 2019-02-28 DIAGNOSIS — O99844 Bariatric surgery status complicating childbirth: Secondary | ICD-10-CM | POA: Diagnosis present

## 2019-02-28 DIAGNOSIS — E669 Obesity, unspecified: Secondary | ICD-10-CM | POA: Diagnosis present

## 2019-02-28 DIAGNOSIS — D65 Disseminated intravascular coagulation [defibrination syndrome]: Secondary | ICD-10-CM | POA: Diagnosis present

## 2019-02-28 DIAGNOSIS — R58 Hemorrhage, not elsewhere classified: Secondary | ICD-10-CM

## 2019-02-28 DIAGNOSIS — O9081 Anemia of the puerperium: Secondary | ICD-10-CM | POA: Diagnosis not present

## 2019-02-28 DIAGNOSIS — O99214 Obesity complicating childbirth: Secondary | ICD-10-CM | POA: Diagnosis present

## 2019-02-28 DIAGNOSIS — Z3A40 40 weeks gestation of pregnancy: Secondary | ICD-10-CM

## 2019-02-28 DIAGNOSIS — O480 Post-term pregnancy: Principal | ICD-10-CM | POA: Diagnosis present

## 2019-02-28 DIAGNOSIS — D62 Acute posthemorrhagic anemia: Secondary | ICD-10-CM | POA: Diagnosis not present

## 2019-02-28 DIAGNOSIS — O9912 Other diseases of the blood and blood-forming organs and certain disorders involving the immune mechanism complicating childbirth: Secondary | ICD-10-CM | POA: Diagnosis present

## 2019-02-28 HISTORY — PX: IR US GUIDE VASC ACCESS RIGHT: IMG2390

## 2019-02-28 HISTORY — PX: PERINEAL LACERATION REPAIR: SHX5389

## 2019-02-28 HISTORY — PX: IR ANGIOGRAM SELECTIVE EACH ADDITIONAL VESSEL: IMG667

## 2019-02-28 HISTORY — PX: IR EMBO ART  VEN HEMORR LYMPH EXTRAV  INC GUIDE ROADMAPPING: IMG5450

## 2019-02-28 HISTORY — PX: IR ANGIOGRAM PELVIS SELECTIVE OR SUPRASELECTIVE: IMG661

## 2019-02-28 HISTORY — PX: IR US GUIDE VASC ACCESS LEFT: IMG2389

## 2019-02-28 LAB — PREPARE RBC (CROSSMATCH)

## 2019-02-28 LAB — CBC
HCT: 37 % (ref 36.0–46.0)
HCT: 21.5 % — ABNORMAL LOW (ref 36.0–46.0)
Hemoglobin: 12.1 g/dL (ref 12.0–15.0)
Hemoglobin: 7 g/dL — ABNORMAL LOW (ref 12.0–15.0)
MCH: 28.7 pg (ref 26.0–34.0)
MCH: 29 pg (ref 26.0–34.0)
MCHC: 32.7 g/dL (ref 30.0–36.0)
MCHC: 32.6 g/dL (ref 30.0–36.0)
MCV: 87.7 fL (ref 80.0–100.0)
MCV: 89.2 fL (ref 80.0–100.0)
Platelets: 224 10*3/uL (ref 150–400)
Platelets: 117 10*3/uL — ABNORMAL LOW (ref 150–400)
RBC: 4.22 MIL/uL (ref 3.87–5.11)
RBC: 2.41 MIL/uL — ABNORMAL LOW (ref 3.87–5.11)
RDW: 13.8 % (ref 11.5–15.5)
RDW: 14 % (ref 11.5–15.5)
WBC: 8.1 10*3/uL (ref 4.0–10.5)
WBC: 16.3 10*3/uL — ABNORMAL HIGH (ref 4.0–10.5)
nRBC: 0 % (ref 0.0–0.2)
nRBC: 0 % (ref 0.0–0.2)

## 2019-02-28 LAB — POCT I-STAT EG7
Acid-base deficit: 4 mmol/L — ABNORMAL HIGH (ref 0.0–2.0)
Bicarbonate: 21.2 mmol/L (ref 20.0–28.0)
Calcium, Ion: 1.15 mmol/L (ref 1.15–1.40)
HCT: 28 % — ABNORMAL LOW (ref 36.0–46.0)
Hemoglobin: 9.5 g/dL — ABNORMAL LOW (ref 12.0–15.0)
O2 Saturation: 26 %
Potassium: 4.5 mmol/L (ref 3.5–5.1)
Sodium: 135 mmol/L (ref 135–145)
TCO2: 22 mmol/L (ref 22–32)
pCO2, Ven: 39.7 mmHg — ABNORMAL LOW (ref 44.0–60.0)
pH, Ven: 7.336 (ref 7.250–7.430)
pO2, Ven: 19 mmHg — CL (ref 32.0–45.0)

## 2019-02-28 LAB — CBC WITH DIFFERENTIAL/PLATELET

## 2019-02-28 LAB — MASSIVE TRANSFUSION PROTOCOL ORDER (BLOOD BANK NOTIFICATION)

## 2019-02-28 LAB — DIC (DISSEMINATED INTRAVASCULAR COAGULATION)PANEL
D-Dimer, Quant: >20 ug/mL-FEU — ABNORMAL HIGH (ref 0.00–0.50)
Fibrinogen: 115 mg/dL — ABNORMAL LOW (ref 210–475)
INR: 1.9 — ABNORMAL HIGH (ref 0.8–1.2)
Platelets: 118 10*3/uL — ABNORMAL LOW (ref 150–400)
Prothrombin Time: 21.3 seconds — ABNORMAL HIGH (ref 11.4–15.2)
Smear Review: NONE SEEN
aPTT: 56 seconds — ABNORMAL HIGH (ref 24–36)

## 2019-02-28 LAB — APTT: aPTT: 42 seconds — ABNORMAL HIGH (ref 24–36)

## 2019-02-28 LAB — PROTIME-INR
INR: 1.4 — ABNORMAL HIGH (ref 0.8–1.2)
Prothrombin Time: 17 seconds — ABNORMAL HIGH (ref 11.4–15.2)

## 2019-02-28 LAB — FIBRINOGEN: Fibrinogen: 268 mg/dL (ref 210–475)

## 2019-02-28 SURGERY — SUTURE REPAIR, LACERATION, PERINEUM
Anesthesia: Epidural

## 2019-02-28 MED ORDER — FENTANYL CITRATE (PF) 100 MCG/2ML IJ SOLN
INTRAMUSCULAR | Status: AC
  Filled 2019-02-28: qty 2

## 2019-02-28 MED ORDER — MISOPROSTOL 25 MCG QUARTER TABLET
25.0000 ug | ORAL_TABLET | ORAL | Status: DC | PRN
  Administered 2019-02-28 (×2): 25 ug via VAGINAL
  Filled 2019-02-28 (×2): qty 1

## 2019-02-28 MED ORDER — LIDOCAINE-EPINEPHRINE (PF) 2 %-1:200000 IJ SOLN
INTRAMUSCULAR | Status: AC
  Filled 2019-02-28: qty 10

## 2019-02-28 MED ORDER — OXYTOCIN 40 UNITS IN NORMAL SALINE INFUSION - SIMPLE MED: 2.5000 [IU]/h | INTRAVENOUS | Status: DC

## 2019-02-28 MED ORDER — FENTANYL CITRATE (PF) 100 MCG/2ML IJ SOLN
INTRAMUSCULAR | Status: DC | PRN
  Administered 2019-02-28: 100 ug via EPIDURAL
  Administered 2019-02-28: 50 ug via EPIDURAL
  Administered 2019-02-28: 50 ug via INTRAVENOUS

## 2019-02-28 MED ORDER — LACTATED RINGERS IV BOLUS: 1000.0000 mL | Freq: Once | INTRAVENOUS | Status: DC

## 2019-02-28 MED ORDER — SODIUM CHLORIDE 0.9 % IV SOLN: INTRAVENOUS | Status: DC | PRN

## 2019-02-28 MED ORDER — LIDOCAINE HCL (PF) 1 % IJ SOLN
INTRAMUSCULAR | Status: DC | PRN
  Administered 2019-02-28 (×2): 4 mL via EPIDURAL

## 2019-02-28 MED ORDER — OXYCODONE-ACETAMINOPHEN 5-325 MG PO TABS: 2.0000 | ORAL_TABLET | ORAL | Status: DC | PRN

## 2019-02-28 MED ORDER — PHENYLEPHRINE 40 MCG/ML (10ML) SYRINGE FOR IV PUSH (FOR BLOOD PRESSURE SUPPORT): 80.0000 ug | PREFILLED_SYRINGE | INTRAVENOUS | Status: DC | PRN

## 2019-02-28 MED ORDER — EPHEDRINE 5 MG/ML INJ: 10.0000 mg | INTRAVENOUS | Status: DC | PRN

## 2019-02-28 MED ORDER — LIDOCAINE-EPINEPHRINE (PF) 2 %-1:200000 IJ SOLN
INTRAMUSCULAR | Status: DC | PRN
  Administered 2019-02-28 (×2): 2 mL via EPIDURAL
  Administered 2019-02-28: 8 mL via EPIDURAL
  Administered 2019-02-28 (×2): 3 mL via EPIDURAL
  Administered 2019-02-28: 2 mL via EPIDURAL

## 2019-02-28 MED ORDER — ALBUMIN HUMAN 5 % IV SOLN
INTRAVENOUS | Status: DC | PRN
  Administered 2019-02-28 (×3): via INTRAVENOUS

## 2019-02-28 MED ORDER — GELATIN ABSORBABLE 12-7 MM EX MISC
CUTANEOUS | Status: AC
  Filled 2019-02-28: qty 1

## 2019-02-28 MED ORDER — CEFAZOLIN SODIUM-DEXTROSE 2-4 GM/100ML-% IV SOLN
2.0000 g | Freq: Once | INTRAVENOUS | Status: AC
  Administered 2019-02-28: 2 g via INTRAVENOUS
  Filled 2019-02-28: qty 100

## 2019-02-28 MED ORDER — SODIUM CHLORIDE 0.9% IV SOLUTION: Freq: Once | INTRAVENOUS | Status: DC

## 2019-02-28 MED ORDER — LACTATED RINGERS IV SOLN
INTRAVENOUS | Status: DC | PRN
  Administered 2019-02-28 (×2): via INTRAVENOUS

## 2019-02-28 MED ORDER — ALBUMIN HUMAN 5 % IV SOLN
INTRAVENOUS | Status: AC
  Filled 2019-02-28: qty 500

## 2019-02-28 MED ORDER — LIDOCAINE HCL (PF) 1 % IJ SOLN
INTRAMUSCULAR | Status: AC | PRN
  Administered 2019-02-28: 5 mL

## 2019-02-28 MED ORDER — TRANEXAMIC ACID 1000 MG/10ML IV SOLN
INTRAVENOUS | Status: DC | PRN
  Administered 2019-02-28: 1000 mg via INTRAVENOUS

## 2019-02-28 MED ORDER — LIDOCAINE HCL 1 % IJ SOLN
INTRAMUSCULAR | Status: AC | PRN
  Administered 2019-02-28: 10 mL

## 2019-02-28 MED ORDER — ACETAMINOPHEN 325 MG PO TABS: 650.0000 mg | ORAL_TABLET | ORAL | Status: DC | PRN

## 2019-02-28 MED ORDER — GELATIN ABSORBABLE 12-7 MM EX MISC
CUTANEOUS | Status: AC | PRN
  Administered 2019-02-28: 1 via TOPICAL

## 2019-02-28 MED ORDER — LACTATED RINGERS IV SOLN: INTRAVENOUS | Status: DC

## 2019-02-28 MED ORDER — TERBUTALINE SULFATE 1 MG/ML IJ SOLN: 0.2500 mg | Freq: Once | INTRAMUSCULAR | Status: DC | PRN

## 2019-02-28 MED ORDER — MIDAZOLAM HCL 2 MG/2ML IJ SOLN
INTRAMUSCULAR | Status: AC
  Filled 2019-02-28: qty 4

## 2019-02-28 MED ORDER — SODIUM CHLORIDE 0.9 % IV SOLN
INTRAVENOUS | Status: DC | PRN
  Administered 2019-02-28 (×2): via INTRAVENOUS

## 2019-02-28 MED ORDER — FENTANYL CITRATE (PF) 100 MCG/2ML IJ SOLN
INTRAMUSCULAR | Status: DC | PRN
  Administered 2019-02-28: 100 ug via EPIDURAL

## 2019-02-28 MED ORDER — ONDANSETRON HCL 4 MG/2ML IJ SOLN
INTRAMUSCULAR | Status: AC
  Filled 2019-02-28: qty 2

## 2019-02-28 MED ORDER — BUTORPHANOL TARTRATE 1 MG/ML IJ SOLN
1.0000 mg | INTRAMUSCULAR | Status: DC | PRN
  Administered 2019-02-28: 07:00:00 1 mg via INTRAVENOUS
  Filled 2019-02-28: qty 1

## 2019-02-28 MED ORDER — PHENYLEPHRINE HCL (PRESSORS) 10 MG/ML IV SOLN
INTRAVENOUS | Status: DC | PRN
  Administered 2019-02-28: 80 ug via INTRAVENOUS
  Administered 2019-02-28: 160 ug via INTRAVENOUS
  Administered 2019-02-28: 80 ug via INTRAVENOUS
  Administered 2019-02-28: 200 ug via INTRAVENOUS
  Administered 2019-02-28: 120 ug via INTRAVENOUS
  Administered 2019-02-28 (×4): 80 ug via INTRAVENOUS
  Administered 2019-02-28: 120 ug via INTRAVENOUS
  Administered 2019-02-28 (×2): 80 ug via INTRAVENOUS
  Administered 2019-02-28: 40 ug via INTRAVENOUS

## 2019-02-28 MED ORDER — LACTATED RINGERS IV SOLN
INTRAVENOUS | Status: DC | PRN
  Administered 2019-02-28: 18:00:00 via INTRAVENOUS

## 2019-02-28 MED ORDER — SODIUM CHLORIDE (PF) 0.9 % IJ SOLN
INTRAMUSCULAR | Status: DC | PRN
  Administered 2019-02-28: 12 mL/h via EPIDURAL

## 2019-02-28 MED ORDER — SOD CITRATE-CITRIC ACID 500-334 MG/5ML PO SOLN: 30.0000 mL | ORAL | Status: DC | PRN

## 2019-02-28 MED ORDER — METHYLERGONOVINE MALEATE 0.2 MG/ML IJ SOLN
INTRAMUSCULAR | Status: DC | PRN
  Administered 2019-02-28: 0.2 mg via INTRAMUSCULAR

## 2019-02-28 MED ORDER — LIDOCAINE HCL 1 % IJ SOLN
INTRAMUSCULAR | Status: AC
  Filled 2019-02-28: qty 20

## 2019-02-28 MED ORDER — MIDAZOLAM HCL 2 MG/2ML IJ SOLN
INTRAMUSCULAR | Status: AC | PRN
  Administered 2019-02-28: 0.5 mg via INTRAVENOUS
  Administered 2019-02-28: 1 mg via INTRAVENOUS
  Administered 2019-02-28: 0.5 mg via INTRAVENOUS

## 2019-02-28 MED ORDER — LACTATED RINGERS IV SOLN: 500.0000 mL | Freq: Once | INTRAVENOUS | Status: DC

## 2019-02-28 MED ORDER — SODIUM CHLORIDE 0.9% IV SOLUTION
Freq: Once | INTRAVENOUS | Status: AC
  Administered 2019-03-01: 01:00:00 via INTRAVENOUS

## 2019-02-28 MED ORDER — TRANEXAMIC ACID-NACL 1000-0.7 MG/100ML-% IV SOLN
INTRAVENOUS | Status: AC
  Filled 2019-02-28: qty 100

## 2019-02-28 MED ORDER — LACTATED RINGERS IV SOLN: 500.0000 mL | INTRAVENOUS | Status: DC | PRN

## 2019-02-28 MED ORDER — PHENYLEPHRINE HCL-NACL 20-0.9 MG/250ML-% IV SOLN
INTRAVENOUS | Status: AC
  Filled 2019-02-28: qty 250

## 2019-02-28 MED ORDER — FENTANYL-BUPIVACAINE-NACL 0.5-0.125-0.9 MG/250ML-% EP SOLN
12.0000 mL/h | EPIDURAL | Status: DC | PRN
  Filled 2019-02-28: qty 250

## 2019-02-28 MED ORDER — DIPHENHYDRAMINE HCL 50 MG/ML IJ SOLN: 12.5000 mg | INTRAMUSCULAR | Status: DC | PRN

## 2019-02-28 MED ORDER — OXYCODONE-ACETAMINOPHEN 5-325 MG PO TABS: 1.0000 | ORAL_TABLET | ORAL | Status: DC | PRN

## 2019-02-28 MED ORDER — PHENYLEPHRINE HCL-NACL 20-0.9 MG/250ML-% IV SOLN
INTRAVENOUS | Status: DC | PRN
  Administered 2019-02-28: 80 ug/min via INTRAVENOUS

## 2019-02-28 MED ORDER — SODIUM CHLORIDE 0.9 % IV SOLN
INTRAVENOUS | Status: DC | PRN
  Administered 2019-02-28 (×3): via INTRAVENOUS

## 2019-02-28 MED ORDER — LIDOCAINE HCL (PF) 1 % IJ SOLN: 30.0000 mL | INTRAMUSCULAR | Status: DC | PRN

## 2019-02-28 MED ORDER — OXYTOCIN 40 UNITS IN NORMAL SALINE INFUSION - SIMPLE MED
INTRAVENOUS | Status: AC
  Filled 2019-02-28: qty 1000

## 2019-02-28 MED ORDER — PHENYLEPHRINE HCL-NACL 20-0.9 MG/250ML-% IV SOLN: INTRAVENOUS | Status: DC | PRN

## 2019-02-28 MED ORDER — METHYLERGONOVINE MALEATE 0.2 MG/ML IJ SOLN
INTRAMUSCULAR | Status: AC
  Filled 2019-02-28: qty 1

## 2019-02-28 MED ORDER — PHENYLEPHRINE 40 MCG/ML (10ML) SYRINGE FOR IV PUSH (FOR BLOOD PRESSURE SUPPORT)
PREFILLED_SYRINGE | INTRAVENOUS | Status: AC
  Filled 2019-02-28: qty 40

## 2019-02-28 MED ORDER — BUPIVACAINE HCL (PF) 0.25 % IJ SOLN
INTRAMUSCULAR | Status: DC | PRN
  Administered 2019-02-28: 6 mL via EPIDURAL

## 2019-02-28 MED ORDER — OXYTOCIN 10 UNIT/ML IJ SOLN
INTRAMUSCULAR | Status: DC | PRN
  Administered 2019-02-28: 40 [IU] via INTRAMUSCULAR

## 2019-02-28 MED ORDER — FENTANYL CITRATE (PF) 100 MCG/2ML IJ SOLN
INTRAMUSCULAR | Status: AC
  Administered 2019-02-28: 50 ug
  Filled 2019-02-28: qty 2

## 2019-02-28 MED ORDER — PHENYLEPHRINE 40 MCG/ML (10ML) SYRINGE FOR IV PUSH (FOR BLOOD PRESSURE SUPPORT)
80.0000 ug | PREFILLED_SYRINGE | INTRAVENOUS | Status: DC | PRN
  Filled 2019-02-28: qty 10

## 2019-02-28 MED ORDER — OXYTOCIN 40 UNITS IN NORMAL SALINE INFUSION - SIMPLE MED
1.0000 m[IU]/min | INTRAVENOUS | Status: DC
  Administered 2019-02-28: 2 m[IU]/min via INTRAVENOUS
  Filled 2019-02-28: qty 1000

## 2019-02-28 MED ORDER — FENTANYL CITRATE (PF) 100 MCG/2ML IJ SOLN
INTRAMUSCULAR | Status: AC | PRN
  Administered 2019-02-28: 50 ug via INTRAVENOUS
  Administered 2019-02-28 (×2): 25 ug via INTRAVENOUS

## 2019-02-28 MED ORDER — ONDANSETRON HCL 4 MG/2ML IJ SOLN
INTRAMUSCULAR | Status: DC | PRN
  Administered 2019-02-28: 4 mg via INTRAVENOUS

## 2019-02-28 MED ORDER — LACTATED RINGERS IV SOLN
INTRAVENOUS | Status: DC
  Administered 2019-02-28 (×3): via INTRAVENOUS

## 2019-02-28 MED ORDER — OXYTOCIN BOLUS FROM INFUSION
500.0000 mL | Freq: Once | INTRAVENOUS | Status: AC
  Administered 2019-02-28: 500 mL via INTRAVENOUS

## 2019-02-28 MED ORDER — ONDANSETRON HCL 4 MG/2ML IJ SOLN: 4.0000 mg | Freq: Four times a day (QID) | INTRAMUSCULAR | Status: DC | PRN

## 2019-02-28 SURGICAL SUPPLY — 21 items
ELECT REM PT RETURN 9FT ADLT (ELECTROSURGICAL) ×2
ELECTRODE REM PT RTRN 9FT ADLT (ELECTROSURGICAL) IMPLANT
GAUZE SPONGE 4X4 16PLY XRAY LF (GAUZE/BANDAGES/DRESSINGS) ×3 IMPLANT
GLOVE BIO SURGEON STRL SZ7 (GLOVE) ×2 IMPLANT
GLOVE BIOGEL M 6.5 STRL (GLOVE) ×1 IMPLANT
GLOVE BIOGEL PI IND STRL 7.0 (GLOVE) IMPLANT
GLOVE BIOGEL PI INDICATOR 7.0 (GLOVE) ×2
GOWN STRL REUS W/ TWL LRG LVL3 (GOWN DISPOSABLE) IMPLANT
GOWN STRL REUS W/TWL LRG LVL3 (GOWN DISPOSABLE) ×8
PACK VAGINAL MINOR WOMEN LF (CUSTOM PROCEDURE TRAY) ×1 IMPLANT
SET BERKELEY SUCTION TUBING (SUCTIONS) ×1 IMPLANT
SPONGE LAP 18X18 RF (DISPOSABLE) ×2 IMPLANT
SPONGE LAP 4X18 RFD (DISPOSABLE) ×1 IMPLANT
SUT CHROMIC 2 0 CT 1 (SUTURE) ×3 IMPLANT
SUT CHROMIC 3 0 CT 36 (SUTURE) ×1 IMPLANT
SUT VIC AB 2-0 CT1 (SUTURE) ×2 IMPLANT
SUT VIC AB 3-0 CT1 27 (SUTURE) ×6
SUT VIC AB 3-0 CT1 TAPERPNT 27 (SUTURE) IMPLANT
TOWEL OR 17X24 6PK STRL BLUE (TOWEL DISPOSABLE) ×1 IMPLANT
TRAY FOLEY W/BAG SLVR 14FR LF (SET/KITS/TRAYS/PACK) ×2 IMPLANT
YANKAUER SUCT BULB TIP NO VENT (SUCTIONS) ×1 IMPLANT

## 2019-02-28 NOTE — Consult Note (Signed)
Neonatology Note:   Attendance at Delivery:    I was asked by Dr. Nelda Marseille to attend this NSVD at term due to [facility]. The mother is a G1P0 O neg, GBS neg with IOL for post-dates, AMA, history of gastric bypass. ROM 4 hours before delivery, fluid clear. Infant apneic, but with HR > 100, and somewhat floppy at birth. Delayed cord clamping was not done. Stimulation was given, but infant did not respond, so PPV was applied by T. Gloriann Loan, RT under my direct supervision. HR remained normal and the baby began to cry within about 25 seconds, at 1.5 minutes of life. Needed only minimal bulb suctioning. Ap 4/9. Lungs clear to ausc in DR, without distress; color pink, well-perfused, alert with good tone by 4-5 minutes. O2 sat 93% in RA at 5-6 minutes. Infant is able to remain with her mother for skin to skin time under nursing supervision. I spoke with the parents. Transferred to the care of Pediatrician.   Real Cons, MD

## 2019-02-28 NOTE — Procedures (Signed)
Post partum hemorrhage/cervical lac  S/p bilateral gelfoam embo of the peripheral internal iliac arteries at the uterine branches  Active bleeding from small rt uterine branches successful occluded with gelfoam embo  No comp Stable ebl min Full report in pacs D/w Dr Nelda Marseille

## 2019-02-28 NOTE — Progress Notes (Signed)
OB PN:  S: Pt was resting comfortably, noted leaking fluid and now with increased pelvic pressure  O: BP (!) 102/58   Pulse 96   Temp 98.1 F (36.7 C) (Oral)   Resp 18   Ht 5\' 6"  (1.676 m)   Wt 107.1 kg   LMP 05/26/2018   SpO2 96%   BMI 38.12 kg/m   FHT: 120bpm, minimal to moderate variablity, no accels, no decels Toco: q2-83min SVE: deferred, per RN 5cm  A/P: 38 y.o. G1P0 @ [redacted]w[redacted]d for IOL 1. FWB: Cat. I 2. Labor: continue Pit per protocol Pain: continue with epidural GBS: negative  Janyth Pupa, DO [phone] (cell) [phone] (office)

## 2019-02-28 NOTE — Anesthesia Preprocedure Evaluation (Signed)
Anesthesia Evaluation  Patient identified by MRN, date of birth, ID band Patient awake    Reviewed: Allergy & Precautions, Patient's Chart, lab work & pertinent test results  History of Anesthesia Complications Negative for: history of anesthetic complications  Airway Mallampati: II  TM Distance: >3 FB Neck ROM: Full    Dental no notable dental hx.    Pulmonary neg pulmonary ROS,    Pulmonary exam normal        Cardiovascular negative cardio ROS Normal cardiovascular exam     Neuro/Psych negative neurological ROS     GI/Hepatic negative GI ROS, Neg liver ROS,   Endo/Other  negative endocrine ROS  Renal/GU negative Renal ROS     Musculoskeletal negative musculoskeletal ROS (+)   Abdominal   Peds  Hematology negative hematology ROS (+)   Anesthesia Other Findings Day of surgery medications reviewed with the patient.  Reproductive/Obstetrics (+) Pregnancy                             Anesthesia Physical Anesthesia Plan  ASA: II  Anesthesia Plan: Epidural   Post-op Pain Management:    Induction:   PONV Risk Score and Plan: Treatment may vary due to age or medical condition  Airway Management Planned: Natural Airway  Additional Equipment:   Intra-op Plan:   Post-operative Plan:   Informed Consent: I have reviewed the patients History and Physical, chart, labs and discussed the procedure including the risks, benefits and alternatives for the proposed anesthesia with the patient or authorized representative who has indicated his/her understanding and acceptance.       Plan Discussed with: CRNA  Anesthesia Plan Comments:         Anesthesia Quick Evaluation  

## 2019-02-28 NOTE — Progress Notes (Signed)
OB PN:  S: Starting to feel more painful contractions  O: BP 111/70   Pulse 65   Temp 97.7 F (36.5 C) (Oral)   Resp 16   Ht 5\' 6"  (1.676 m)   Wt 107.1 kg   LMP 05/26/2018   BMI 38.12 kg/m   FHT: 120bpm, moderate variablity, + accels, no decels Toco: irregualr SVE: 1/thick/-3, Cook balloon placed with sterile speculum- 60cc  A/P: 38 y.o. G1P0 @ [redacted]w[redacted]d for IOL 1. FWB: Cat. I 2. Labor: s/p cytotec #2, plan to transition to Pitocin this am.  Lacinda Axon balloon placed Pain: IV or epidural upon request GBS: negative  Janyth Pupa, DO [phone] (cell) [phone] (office)

## 2019-02-28 NOTE — Transfer of Care (Signed)
Immediate Anesthesia Transfer of Care Note  Patient: MARLEEN MORET  Procedure(s) Performed: SUTURE REPAIR PERINEAL LACERATION (N/A )  Patient Location: ICU  Anesthesia Type:Epidural  Level of Consciousness: awake, alert  and patient cooperative  Airway & Oxygen Therapy: Patient Spontanous Breathing and Patient connected to nasal cannula oxygen  Post-op Assessment: Report given to RN and Post -op Vital signs reviewed and stable  Post vital signs: Reviewed and stable  Last Vitals:  Vitals Value Taken Time  BP 93/66 02/28/19 2322  Temp    Pulse 105 02/28/19 2327  Resp 19 02/28/19 2327  SpO2 99 % 02/28/19 2327  Vitals shown include unvalidated device data.  Last Pain:  Vitals:   02/28/19 2122  TempSrc:   PainSc: 0-No pain         Complications: No apparent anesthesia complications

## 2019-02-28 NOTE — Op Note (Signed)
Preop Dx: Active bleeding, vaginal wall laceration Postop Dx: Cervical laceration, bilateral vaginal wall lacerations, DIC Procedure: Repair of cervical lacerations and vaginal wall lacerations Surgeon: Dr. Janyth Pupa Assistant: Dr. Christophe Louis Dr. Verita Schneiders Anesthesia: epidural Complications: none EBL: 4500cc (total) UOP: 150cc  Findings: Large posterior cervical laceration, left and right vaginal wall lacerations  Procedure:  Informed consent was obtained from the patient prior to taking her to the operating room where anesthesia was found to be adequate. She was placed in low lithotomy position. She was prepped and draped in normal sterile fashion. The bladder was catheterized with a foley under sterile technique.  Retractors were placed, active bleeding was noted from the posterior cervix.  2-0 plain gut was used in a running fashion to repair the cervical laceration with difficulty due to the friability of the cervix and the location of the tear.  The left and right vaginal wall lacerations were repaired with 3-0 vicryl in a locked running fashion.  Bleeding had improved, but bleeding was will still noted.  There was no specific site of bleeding, rather generalized oozing.  Vaginal packing was placed and pt was fluid resuscitated.    Plan was reviewed with patient about potential options, including potential for hysterectomy.  Pt strongly desired any and all interventions possible to avoid hysterectomy.  Dr. Harolyn Rutherford was called for a 2nd opinion.  Dr. Harolyn Rutherford attempted an exam; however, at that time, bleeding had increased and there was concern for DIC.  MTP was initiated and packing was placed to hold pressure.  In the OR she was resuscitated with 4upRBC, 2uFFP, 2u platelet and 1 pack of cryo.  Plan was made to proceed with uterine artery embolization as vitals remained stable during the above procedure.  Janyth Pupa, DO [phone] (cell) [phone] (office)

## 2019-02-28 NOTE — Anesthesia Procedure Notes (Signed)
Epidural Patient location during procedure: OB Start time: 02/28/2019 10:35 AM End time: 02/28/2019 10:38 AM  Staffing Anesthesiologist: Brennan Bailey, MD Performed: anesthesiologist   Preanesthetic Checklist Completed: patient identified, pre-op evaluation, timeout performed, IV checked, risks and benefits discussed and monitors and equipment checked  Epidural Patient position: sitting Prep: site prepped and draped and DuraPrep Patient monitoring: continuous pulse ox, blood pressure, heart rate and cardiac monitor Approach: midline Location: L3-L4 Injection technique: LOR air  Needle:  Needle type: Tuohy  Needle gauge: 17 G Needle length: 9 cm Needle insertion depth: 7 cm Catheter type: closed end flexible Catheter size: 19 Gauge Catheter at skin depth: 12 cm Test dose: negative and Other (1% lidocaine)  Assessment Events: blood not aspirated, injection not painful, no injection resistance, negative IV test and no paresthesia  Additional Notes Patient identified. Risks, benefits, and alternatives discussed with patient including but not limited to bleeding, infection, nerve damage, paralysis, failed block, incomplete pain control, headache, blood pressure changes, nausea, vomiting, reactions to medication, itching, and postpartum back pain. Confirmed with bedside nurse the patient's most recent platelet count. Confirmed with patient that they are not currently taking any anticoagulation, have any bleeding history, or any family history of bleeding disorders. Patient expressed understanding and wished to proceed. All questions were answered. Sterile technique was used throughout the entire procedure. Crisp LOR on first pass. Please see nursing notes for vital signs. Test dose was given through epidural catheter and negative prior to continuing to dose epidural or start infusion. Warning signs of high block given to the patient including shortness of breath, tingling/numbness in  hands, complete motor block, or any concerning symptoms with instructions to call for help. Patient was given instructions on fall risk and not to get out of bed. All questions and concerns addressed with instructions to call with any issues or inadequate analgesia.  Reason for block:procedure for pain

## 2019-02-28 NOTE — Sedation Documentation (Signed)
Right and left 47F exoseals deployed

## 2019-02-28 NOTE — Consult Note (Signed)
NAME:  Kristy Hines, MRN:  [id number], DOB:  December 02, 1980, LOS: 0 ADMISSION DATE:  02/28/2019, CONSULTATION DATE:  02/28/2019 REFERRING MD:  Dr. Nelda Marseille, CHIEF COMPLAINT:  DIC/ post vaginal delivery hemorrhage   Brief History   29 yoF s/p vaginal delivery at [redacted]w[redacted]d requiring vacuum assist complicated by post delivery hemorrhage and DIC, found to have cervical laceration in OR. Total EBL ~4.5L s/p multiple blood products.  Hysterectomy avoided given patient request therefore taken to IR where patient had embolization to bleeding uterine artery.  To be monitored in ICU overnight.    History of present illness    37 year old female with history of gastric bypass admitted to [facility] on 6/28 by OB.  Patient was G1PO at [redacted]w[redacted]d underwent vaginal delivery requiring vacuum assist 6/29.  Post delivery, patient had significant bleeding despite attempts to repair to left vaginal wall.  Taken emergently to OR for further investigation and found to have cervical laceration not able to be repaired.  Estimated blood loss around 4.5L.  Labs consistent with DIC.  Transfused with multiple blood products.  Hysterectomy avoided given patient wishes to have more children, therefore was emergently to IR.  Found to have active bleeding from small right uterine branches which was successful embolized.  Patient has surprisingly remained hemodynamically stable.  Patient to be monitored in ICU overnight with PCCM consulting for ongoing medical management.   Past Medical History  Gastric bypass in 2019, choledocholithiasis   Significant Hospital Events   6/28 admitted 6/29 vaginal delivery/ hemorrhage   Consults:  IR  Procedures:  6/29 epidural >>  6/29 IR > Active bleeding from small rt uterine branches successful occluded with gelfoam embo.  S/p bilateral gelfoam embo of the peripheral internal iliac arteries at the uterine branches   Significant Diagnostic Tests:   Micro Data:  6/25 SARS corona2 >> neg   Antimicrobials:  6/29 cefazolin preop  Interim history/subjective:  Currently denies any pain or SOB.  Just had pain meds via epidural.   Improving coags on DIC panel Hgb trend stable at 7.2, plts down from 117-> 67 Currently on her 5th unit PRBC and 3rd pack of platelets   Objective   Blood pressure 121/80, pulse 65, temperature 98.3 F (36.8 C), temperature source Oral, resp. rate 16, height 5\' 6"  (1.676 m), weight 107.1 kg, last menstrual period 05/26/2018, SpO2 96 %, unknown if currently breastfeeding.        Intake/Output Summary (Last 24 hours) at 02/28/2019 2151 Last data filed at 02/28/2019 2146 Gross per 24 hour  Intake 5569 ml  Output 2409 ml  Net 3160 ml   Filed Weights   02/28/19 0038  Weight: 107.1 kg   Examination: General:  Pale adult female in NAD lying supine in bed HEENT: MM pink/moist, pupils 3/reactive Neuro: alert, oriented x 3, MAE CV: rrr, no murmur, right groin site soft, no bruising, dressing dry, intact distal DP  PULM: even/non-labored, lungs bilaterally clear, on 2L [place] GI: soft, +bs, foley present, packing present to vaginal area- some blood noted on bed Extremities: warm/dry, no edema  Skin: no rashes  Resolved Hospital Problem list    Assessment & Plan:   Cervical laceration s/p vaginal delivery requiring vacuum assist w/hemorrage s/p IR emobolization to uterine artery  ABLA - EBL of 4.5L s/p multiple blood products (4 PRBC, 2 pk plts, 2 FFP, 3 cryo) DIC - currently on 2L [place] without SOB and lungs clear - at risk for mass transfusion associated complications  including TRALI/ TACO P:  Monitoring closely in ICU overnight- currently hemodynamically stable Goal MAP >65 Supplemental O2 prn, goal >94% Monitoring UOP hourly, continue foley LR at 100 ml/hr BMET in am  OB following closely, plans to recheck around 0130, possibly remove vaginal packing and assess for any ongoing bleeding.  If ongoing, may require hysterectomy  Ongoing epidural in  place per OB Currently on 5th unit PRBC and 3rd pack of platelets - to recheck DIC panel and CBC 2 hours after completion of transfusion   Best practice:  Diet: NPO- sips/ chips Pain/Anxiety/Delirium protocol (if indicated): per OB/ epidural  VAP protocol (if indicated): n/a DVT prophylaxis: SCDs only  GI prophylaxis: n/a Glucose control: n/a Mobility: BR Code Status: Full  Family Communication: patient updated on plan of care Disposition: ICU  Labs   CBC: Recent Labs  Lab 02/28/19 0020 02/28/19 1831 02/28/19 1904  WBC 8.1  --  16.3*  HGB 12.1 9.5* 7.0*  HCT 37.0 28.0* 21.5*  MCV 87.7  --  89.2  PLT 224  --  118*  117*    Basic Metabolic Panel: Recent Labs  Lab 02/28/19 1831  NA 135  K 4.5   GFR: CrCl cannot be calculated (Patient's most recent lab result is older than the maximum 21 days allowed.). Recent Labs  Lab 02/28/19 0020 02/28/19 1904  WBC 8.1 16.3*    Liver Function Tests: No results for input(s): AST, ALT, ALKPHOS, BILITOT, PROT, ALBUMIN in the last 168 hours. No results for input(s): LIPASE, AMYLASE in the last 168 hours. No results for input(s): AMMONIA in the last 168 hours.  ABG    Component Value Date/Time   HCO3 21.2 02/28/2019 1831   TCO2 22 02/28/2019 1831   ACIDBASEDEF 4.0 (H) 02/28/2019 1831   O2SAT 26.0 02/28/2019 1831     Coagulation Profile: Recent Labs  Lab 02/28/19 1904  INR 1.9*    Cardiac Enzymes: No results for input(s): CKTOTAL, CKMB, CKMBINDEX, TROPONINI in the last 168 hours.  HbA1C: No results found for: HGBA1C  CBG: No results for input(s): GLUCAP in the last 168 hours.  Review of Systems:   Review of Systems  Constitutional: Negative for chills and fever.  Respiratory: Negative for cough, shortness of breath and wheezing.   Cardiovascular: Negative for chest pain, orthopnea and leg swelling.  Gastrointestinal: Negative for nausea and vomiting.  Neurological: Negative for focal weakness, loss of  consciousness and headaches.   Past Medical History  She,  has a past medical history of Gallstones and Multiple thyroid nodules.   Surgical History    Past Surgical History:  Procedure Laterality Date  . CHOLECYSTECTOMY N/A 04/21/2018   Procedure: LAPAROSCOPIC CHOLECYSTECTOMY;  Surgeon: Jimmye NormanWyatt, James, MD;  [facility];  Service: General;  Laterality: N/A;  . ERCP Left 04/21/2018   Procedure: ENDOSCOPIC RETROGRADE CHOLANGIOPANCREATOGRAPHY (ERCP);  Surgeon: Kerin SalenKarki, Arya, MD;  [facility];  Service: Gastroenterology;  Laterality: Left;  . LAPAROSCOPIC GASTRIC SLEEVE RESECTION  07/2017  . REMOVAL OF STONES  04/21/2018   Procedure: REMOVAL OF STONES;  Surgeon: Kerin SalenKarki, Arya, MD;  [facility];  Service: Gastroenterology;;  . Dennison MascotSPHINCTEROTOMY  04/21/2018   Procedure: SPHINCTEROTOMY;  Surgeon: Kerin SalenKarki, Arya, MD;  [facility];  Service: Gastroenterology;;     Social History   reports that she has never smoked. She has never used smokeless tobacco. She reports previous alcohol use. She reports that she does not use drugs.   Family History   Her family history  includes Cancer in her maternal grandfather; Heart disease in her father; Hyperlipidemia in her father; Hypertension in her father and mother; Leukemia in her maternal aunt.   Allergies No Known Allergies   Home Medications  Prior to Admission medications   Medication Sig Start Date End Date Taking? Authorizing Provider  Multiple Vitamin (MULTIVITAMIN) tablet Take 1 tablet by mouth daily.    [provider]  oxyCODONE-acetaminophen (PERCOCET) 5-325 MG tablet Take 1 tablet by mouth every 6 (six) hours as needed for severe pain. Patient not taking: Reported on 09/28/2018 04/22/18   Jerre SimonFocht, Jessica L, PA  SPRINTEC 28 0.25-35 MG-MCG tablet Take 1 tablet by mouth daily. 01/28/18   [provider]     Critical care time:  35 mins    Posey BoyerBrooke Goerge Mohr, MSN, AGACNP-BC [facility]  Pgr: [phone] or if no answer [phone] 03/01/2019, 2:25 AM

## 2019-02-28 NOTE — Anesthesia Preprocedure Evaluation (Signed)
Anesthesia Evaluation  Patient identified by MRN, date of birth, ID band Patient awake    Reviewed: Allergy & Precautions, NPO status , Patient's Chart, lab work & pertinent test results  History of Anesthesia Complications Negative for: history of anesthetic complications  Airway Mallampati: II  TM Distance: >3 FB Neck ROM: Full    Dental no notable dental hx.    Pulmonary neg pulmonary ROS,    Pulmonary exam normal        Cardiovascular negative cardio ROS Normal cardiovascular exam     Neuro/Psych negative neurological ROS     GI/Hepatic negative GI ROS, Neg liver ROS,   Endo/Other  negative endocrine ROS  Renal/GU negative Renal ROS     Musculoskeletal negative musculoskeletal ROS (+)   Abdominal   Peds  Hematology negative hematology ROS (+)   Anesthesia Other Findings Day of surgery medications reviewed with the patient.  Reproductive/Obstetrics Perineal laceration during delivery                             Anesthesia Physical Anesthesia Plan  ASA: II and emergent  Anesthesia Plan: Epidural   Post-op Pain Management:    Induction:   PONV Risk Score and Plan: Treatment may vary due to age or medical condition  Airway Management Planned: Natural Airway  Additional Equipment:   Intra-op Plan:   Post-operative Plan:   Informed Consent: I have reviewed the patients History and Physical, chart, labs and discussed the procedure including the risks, benefits and alternatives for the proposed anesthesia with the patient or authorized representative who has indicated his/her understanding and acceptance.       Plan Discussed with: CRNA  Anesthesia Plan Comments:         Anesthesia Quick Evaluation

## 2019-03-01 ENCOUNTER — Encounter (HOSPITAL_COMMUNITY): Payer: Self-pay | Admitting: Interventional Radiology

## 2019-03-01 DIAGNOSIS — R58 Hemorrhage, not elsewhere classified: Secondary | ICD-10-CM

## 2019-03-01 LAB — PREPARE FRESH FROZEN PLASMA
Unit division: 0
Unit division: 0
Unit division: 0
Unit division: 0

## 2019-03-01 LAB — PROTIME-INR
INR: 1.4 — ABNORMAL HIGH (ref 0.8–1.2)
INR: 1.3 — ABNORMAL HIGH (ref 0.8–1.2)
Prothrombin Time: 16.6 seconds — ABNORMAL HIGH (ref 11.4–15.2)
Prothrombin Time: 15.7 seconds — ABNORMAL HIGH (ref 11.4–15.2)

## 2019-03-01 LAB — PREPARE PLATELET PHERESIS
Unit division: 0
Unit division: 0
Unit division: 0

## 2019-03-01 LAB — BPAM CRYOPRECIPITATE
Blood Product Expiration Date: 202006300116
Blood Product Expiration Date: 202006300215
Blood Product Expiration Date: 202006300215
ISSUE DATE / TIME: 202006291946
ISSUE DATE / TIME: 202006292046
ISSUE DATE / TIME: 202006292046
Unit Type and Rh: 5100
Unit Type and Rh: 5100
Unit Type and Rh: 5100

## 2019-03-01 LAB — CBC WITH DIFFERENTIAL/PLATELET
Abs Immature Granulocytes: 0.07 10*3/uL (ref 0.00–0.07)
Abs Immature Granulocytes: 0.05 10*3/uL (ref 0.00–0.07)
Basophils Absolute: 0 10*3/uL (ref 0.0–0.1)
Basophils Absolute: 0 10*3/uL (ref 0.0–0.1)
Basophils Relative: 0 %
Basophils Relative: 0 %
Eosinophils Absolute: 0 10*3/uL (ref 0.0–0.5)
Eosinophils Absolute: 0 10*3/uL (ref 0.0–0.5)
Eosinophils Relative: 0 %
Eosinophils Relative: 0 %
HCT: 21.1 % — ABNORMAL LOW (ref 36.0–46.0)
HCT: 22.2 % — ABNORMAL LOW (ref 36.0–46.0)
Hemoglobin: 7.2 g/dL — ABNORMAL LOW (ref 12.0–15.0)
Hemoglobin: 7.6 g/dL — ABNORMAL LOW (ref 12.0–15.0)
Immature Granulocytes: 1 %
Immature Granulocytes: 0 %
Lymphocytes Relative: 4 %
Lymphocytes Relative: 8 %
Lymphs Abs: 0.6 10*3/uL — ABNORMAL LOW (ref 0.7–4.0)
Lymphs Abs: 1 10*3/uL (ref 0.7–4.0)
MCH: 29.8 pg (ref 26.0–34.0)
MCH: 29.7 pg (ref 26.0–34.0)
MCHC: 34.1 g/dL (ref 30.0–36.0)
MCHC: 34.2 g/dL (ref 30.0–36.0)
MCV: 87.2 fL (ref 80.0–100.0)
MCV: 86.7 fL (ref 80.0–100.0)
Monocytes Absolute: 0.6 10*3/uL (ref 0.1–1.0)
Monocytes Absolute: 0.7 10*3/uL (ref 0.1–1.0)
Monocytes Relative: 5 %
Monocytes Relative: 6 %
Neutro Abs: 11.7 10*3/uL — ABNORMAL HIGH (ref 1.7–7.7)
Neutro Abs: 11.5 10*3/uL — ABNORMAL HIGH (ref 1.7–7.7)
Neutrophils Relative %: 90 %
Neutrophils Relative %: 86 %
Platelets: 67 10*3/uL — ABNORMAL LOW (ref 150–400)
Platelets: 61 10*3/uL — ABNORMAL LOW (ref 150–400)
RBC: 2.42 MIL/uL — ABNORMAL LOW (ref 3.87–5.11)
RBC: 2.56 MIL/uL — ABNORMAL LOW (ref 3.87–5.11)
RDW: 13.8 % (ref 11.5–15.5)
RDW: 13.9 % (ref 11.5–15.5)
WBC: 12.9 10*3/uL — ABNORMAL HIGH (ref 4.0–10.5)
WBC: 13.4 10*3/uL — ABNORMAL HIGH (ref 4.0–10.5)
nRBC: 0 % (ref 0.0–0.2)
nRBC: 0 % (ref 0.0–0.2)

## 2019-03-01 LAB — BPAM PLATELET PHERESIS
Blood Product Expiration Date: 202006302359
Blood Product Expiration Date: 202007012359
Blood Product Expiration Date: 202007022359
ISSUE DATE / TIME: 202006291913
ISSUE DATE / TIME: 202006292020
ISSUE DATE / TIME: 202006292350
Unit Type and Rh: 600
Unit Type and Rh: 6200
Unit Type and Rh: 6200

## 2019-03-01 LAB — COMPREHENSIVE METABOLIC PANEL
ALT: 11 U/L (ref 0–44)
AST: 32 U/L (ref 15–41)
Albumin: 2.3 g/dL — ABNORMAL LOW (ref 3.5–5.0)
Alkaline Phosphatase: 93 U/L (ref 38–126)
Anion gap: 8 (ref 5–15)
BUN: 16 mg/dL (ref 6–20)
CO2: 18 mmol/L — ABNORMAL LOW (ref 22–32)
Calcium: 7.2 mg/dL — ABNORMAL LOW (ref 8.9–10.3)
Chloride: 112 mmol/L — ABNORMAL HIGH (ref 98–111)
Creatinine, Ser: 1.11 mg/dL — ABNORMAL HIGH (ref 0.44–1.00)
GFR calc Af Amer: >60 mL/min (ref >60)
GFR calc non Af Amer: >60 mL/min (ref >60)
Glucose, Bld: 109 mg/dL — ABNORMAL HIGH (ref 70–99)
Potassium: 4.7 mmol/L (ref 3.5–5.1)
Sodium: 138 mmol/L (ref 135–145)
Total Bilirubin: 1.5 mg/dL — ABNORMAL HIGH (ref 0.3–1.2)
Total Protein: 4.1 g/dL — ABNORMAL LOW (ref 6.5–8.1)

## 2019-03-01 LAB — BPAM FFP
Blood Product Expiration Date: 202007022359
Blood Product Expiration Date: 202007022359
Blood Product Expiration Date: 202007022359
Blood Product Expiration Date: 202007042359
Blood Product Expiration Date: 202007042359
ISSUE DATE / TIME: 202006291913
ISSUE DATE / TIME: 202006292010
ISSUE DATE / TIME: 202006300144
Unit Type and Rh: 5100
Unit Type and Rh: 5100
Unit Type and Rh: 600
Unit Type and Rh: 6200
Unit Type and Rh: 6200

## 2019-03-01 LAB — PREPARE CRYOPRECIPITATE
Unit division: 0
Unit division: 0
Unit division: 0

## 2019-03-01 LAB — APTT
aPTT: 43 seconds — ABNORMAL HIGH (ref 24–36)
aPTT: 34 seconds (ref 24–36)

## 2019-03-01 LAB — MRSA PCR SCREENING: MRSA by PCR: NEGATIVE

## 2019-03-01 LAB — FIBRINOGEN
Fibrinogen: 288 mg/dL (ref 210–475)
Fibrinogen: 368 mg/dL (ref 210–475)

## 2019-03-01 LAB — SAVE SMEAR(SSMR), FOR PROVIDER SLIDE REVIEW

## 2019-03-01 LAB — HEMOGLOBIN AND HEMATOCRIT, BLOOD
HCT: 20 % — ABNORMAL LOW (ref 36.0–46.0)
Hemoglobin: 7 g/dL — ABNORMAL LOW (ref 12.0–15.0)

## 2019-03-01 LAB — PLATELET COUNT: Platelets: 53 10*3/uL — ABNORMAL LOW (ref 150–400)

## 2019-03-01 MED ORDER — CHLORHEXIDINE GLUCONATE CLOTH 2 % EX PADS: 6.0000 | MEDICATED_PAD | Freq: Every day | CUTANEOUS | Status: DC

## 2019-03-01 MED ORDER — ACETAMINOPHEN-CODEINE 120-12 MG/5ML PO SOLN
10.0000 mL | Freq: Once | ORAL | Status: DC
  Filled 2019-03-01 (×2): qty 10

## 2019-03-01 MED ORDER — COCONUT OIL OIL
1.0000 "application " | TOPICAL_OIL | Status: DC | PRN
  Administered 2019-03-01: 1 via TOPICAL
  Filled 2019-03-01 (×2): qty 120

## 2019-03-01 MED ORDER — ONDANSETRON HCL 4 MG PO TABS: 4.0000 mg | ORAL_TABLET | ORAL | Status: DC | PRN

## 2019-03-01 MED ORDER — FENTANYL CITRATE (PF) 100 MCG/2ML IJ SOLN
25.0000 ug | Freq: Once | INTRAMUSCULAR | Status: AC
  Administered 2019-03-01: 04:00:00 25 ug via INTRAVENOUS
  Filled 2019-03-01: qty 2

## 2019-03-01 MED ORDER — SENNOSIDES-DOCUSATE SODIUM 8.6-50 MG PO TABS
2.0000 | ORAL_TABLET | ORAL | Status: DC
  Administered 2019-03-01 – 2019-03-02 (×2): 2 via ORAL
  Filled 2019-03-01 (×2): qty 2

## 2019-03-01 MED ORDER — ACETAMINOPHEN 325 MG PO TABS
650.0000 mg | ORAL_TABLET | ORAL | Status: DC | PRN
  Administered 2019-03-01 – 2019-03-02 (×5): 650 mg via ORAL
  Filled 2019-03-01 (×5): qty 2

## 2019-03-01 MED ORDER — BENZOCAINE-MENTHOL 20-0.5 % EX AERO
1.0000 "application " | INHALATION_SPRAY | CUTANEOUS | Status: DC | PRN
  Administered 2019-03-01: 1 via TOPICAL
  Filled 2019-03-01 (×2): qty 56

## 2019-03-01 MED ORDER — OXYCODONE HCL 5 MG PO TABS
5.0000 mg | ORAL_TABLET | Freq: Four times a day (QID) | ORAL | Status: DC | PRN
  Administered 2019-03-01 – 2019-03-02 (×4): 5 mg via ORAL
  Filled 2019-03-01 (×4): qty 1

## 2019-03-01 MED ORDER — DIBUCAINE 1 % EX OINT
1.0000 "application " | TOPICAL_OINTMENT | CUTANEOUS | Status: DC | PRN
  Filled 2019-03-01: qty 28.35

## 2019-03-01 MED ORDER — RHO D IMMUNE GLOBULIN 1500 UNIT/2ML IJ SOSY
300.0000 ug | PREFILLED_SYRINGE | Freq: Once | INTRAMUSCULAR | Status: AC
  Administered 2019-03-01: 300 ug via INTRAVENOUS
  Filled 2019-03-01: qty 2

## 2019-03-01 MED ORDER — DIPHENHYDRAMINE HCL 25 MG PO CAPS: 25.0000 mg | ORAL_CAPSULE | Freq: Four times a day (QID) | ORAL | Status: DC | PRN

## 2019-03-01 MED ORDER — WITCH HAZEL-GLYCERIN EX PADS
1.0000 "application " | MEDICATED_PAD | CUTANEOUS | Status: DC | PRN
  Filled 2019-03-01: qty 100

## 2019-03-01 MED ORDER — LACTATED RINGERS IV SOLN: INTRAVENOUS | Status: DC

## 2019-03-01 MED ORDER — PRENATAL MULTIVITAMIN CH
1.0000 | ORAL_TABLET | Freq: Every day | ORAL | Status: DC
  Administered 2019-03-01 – 2019-03-02 (×2): 1 via ORAL
  Filled 2019-03-01 (×2): qty 1

## 2019-03-01 MED ORDER — ONDANSETRON HCL 4 MG/2ML IJ SOLN: 4.0000 mg | INTRAMUSCULAR | Status: DC | PRN

## 2019-03-01 MED ORDER — SIMETHICONE 80 MG PO CHEW
80.0000 mg | CHEWABLE_TABLET | ORAL | Status: DC | PRN
  Administered 2019-03-02: 80 mg via ORAL
  Filled 2019-03-01: qty 1

## 2019-03-01 MED ORDER — TRAMADOL HCL 50 MG PO TABS
50.0000 mg | ORAL_TABLET | Freq: Four times a day (QID) | ORAL | Status: DC
  Administered 2019-03-01 – 2019-03-03 (×8): 50 mg via ORAL
  Filled 2019-03-01 (×8): qty 1

## 2019-03-01 NOTE — Progress Notes (Addendum)
Postop Note:  Vaginal packing removed around 2am.  Moderate amount of bleeding noted- pad changed with plans to closely monitor.  At bedside - pt reports some pelvic pressure and discomfort, improved with current medication.  Pt denies feeling further bleeding.  Denies F/C/CP/SOB.  No acute complaints  O: BP 104/64   Pulse 92   Temp 98.4 F (36.9 C) (Oral)   Resp 20   Ht 5\' 6"  (1.676 m)   Wt 107.1 kg   LMP 05/26/2018   SpO2 100%   Breastfeeding Unknown   BMI 38.12 kg/m  UOP: 525/8hr  Gen: alert & oriented Resp: Normal respiratory rate and effort Abd: soft and non-tender, uterus below umbilicus GU: Peripad dry, no active vaginal bleeding noted Ext: 1+ edema, SCDs in place  CBC Latest Ref Rng & Units 02/28/2019 02/28/2019 02/28/2019  WBC 4.0 - 10.5 K/uL 12.9(H) - 16.3(H)  Hemoglobin 12.0 - 15.0 g/dL 7.2(L) - 7.0(L)  Hematocrit 36.0 - 46.0 % 21.1(L) - 21.5(L)  Platelets 150 - 400 K/uL 67(L) 118(L) 117(L)    A/P: 38yo s/p VAVD with cervical laceration- complicated by PPH, DIC, s/p uterine a. Embolization, POD#1  Heme: -labs pending for this am -s/p an additional 1upRBC and 1 pack of platelet overnight (Total: 5u pRBC, 3 cryo, 3 platelet, 2 FFP) -VSS and UOP remains adequate -no evidence of active bleeding currently -if labs remain stable and lochia remains appropriate, will plan to transfer back to [facility]  -LR @ 100cc/hr  GI: -tolerating clears, plan to advance diet as tolerated today  GU: -foley in place, adequate UOP -if labs remain stable, will consider removing foley later today once pt ambulating  Pain -well controlled with fentanyl, plan to transition to oral tylenol and ultram, hold off on NSAIDs  Activity:  -currently bedrest, plan to work on ambulation later today -lactation support to assist with breastfeeding  Janyth Pupa, DO [phone] (cell) [phone] (office)

## 2019-03-01 NOTE — Progress Notes (Signed)
eLink Physician-Brief Progress Note Patient Name: LILLYONNA ARMSTEAD DOB: December 21, 1980 MRN: [id number]   Date of Service  03/01/2019  HPI/Events of Note  Earlier hand off from Dr Nelda Marseille admitted to ICU ; 38 yr old female delivered a baby at noon. Later dx as Vaginal tear in DIC. Received Massive transfusion protocol and IR uterine artery embolization. Shifted to ICU.   Camera: Feeling fine. Discussed with bed side RN. HR 89, MAP 80, sats 97% on  2 lit o2. Looks comfortable. Getting 2 more units or PRBC/ Plt.  Data: Reviewed. Last Hg 7. PLT 118. VBG pH 7.33  eICU Interventions  - continue care - watch for sob/TRALI - follow coags/CMP/cbc/post transfusion  Primary team/ICU Dr Patsey Berthold is aware of admission.       Intervention Category Major Interventions: Hemorrhage - evaluation and management Intermediate Interventions: Coagulopathy - evaluation and management Evaluation Type: New Patient Evaluation  Elmer Sow 03/01/2019, 12:18 AM

## 2019-03-01 NOTE — Lactation Note (Signed)
This note was copied from a baby's chart. Lactation Consultation Note  Patient Name: Kristy Hines OVZCH'Y Date: 03/01/2019 Reason for consult: Initial assessment;1st time breastfeeding;Primapara;Term;Other (Comment)(Mother on ICU)  Visited with mom of a 28 hours old FT female, mom is in the ICU but her RN called lactation and let us know she's ready to start pumping. LC brought pump and DEBP kit, pump serial # [id number]; mom awake and responsive when entering the room, she told LC that she wanted to start pumping today and that she feels "OK" from the waist up, but not from the waist down.  She is a P1 and reported (+) breast changes during the pregnancy, she participated in the [facility] program at the [facility] HD but she doesn't have a pump at home, but asked [place] to talked to her lactation person in [place] so she can get her DEBP. LC assisted mom with calling the [facility] office and also spoke on her behalf, "B" who is her contact person there will be issuing a DEBP on Thursday to the patient's MIL, she'll be the one picking it up.  LC also assisted with hand expression, only tiny glistering droplets observed on the left breast none on the right one yet. Provided mom reassurance that this is a learning curve and that once continues stimulation every 3 hours starts kicking in, her body will respond and start making milk, lactogenesis II was also discussed. When doing hand expression noticed that mom's nipples are flat, but her tissue is very compressible. She brought a nursing bra to the hospital and she'll need to be set up with breast shells once she gets transfer to [facility].   LC also showed mom how to do breast massage prior pumping, DEBP instructions, cleaning and storage were reviewed with her and her ICU RN Kristy Hines who will be assisting with it in the meantime. LC applied coconut oil prior pumping and mom started pumping during [facility] consultation and reported no pain or discomfort so far; praised her  for her efforts. Reviewed some BF basics and transition from ICU to [facility].  Feeding plan:  1. Baby will continue getting supplemented with Gerber Gentle while infant separation occurs 2. Mom will work on breast massage and hand expression prior pumping 3. She will try pumping every 3 hours, applying coconut oil prior pumping sessions  BF brochure, BF resources and feeding diary were reviewed. Mom reported all questions and concerns were answered, she's aware of [place] services and will call PRN.   Maternal Data Formula Feeding for Exclusion: Yes Reason for exclusion: Admission to Intensive Care Unit (ICU) post-partum Has patient been taught Hand Expression?: Yes Does the patient have breastfeeding experience prior to this delivery?: No  Feeding Feeding Type: Bottle Fed - Formula Nipple Type: Slow - flow   Interventions Interventions: Breast feeding basics reviewed;Breast massage;Hand express;Breast compression;DEBP;Coconut oil  Lactation Tools Discussed/Used Tools: Pump;Coconut oil Breast pump type: Double-Electric Breast Pump WIC Program: Yes Pump Review: Setup, frequency, and cleaning;Milk Storage Initiated by:: MPeck Date initiated:: 03/01/19   Consult Status Consult Status: Follow-up Date: 03/02/19 Follow-up type: In-patient    Kristy Hines Kristy Hines 03/01/2019, 12:48 PM

## 2019-03-01 NOTE — Lactation Note (Signed)
This note was copied from a baby's chart. Lactation Consultation Note Baby 42 hrs old. FOB in rm. W/baby will feed formula w/bottle d/t he isn't comfortable using curve tip syring. Mom wants to BF. She is in ICU. LC called mom's nurse to check on status. Dr.'s will be coming back to do some kind of procedure to mom. RN will call [place] when it is OK to come talk w/mom and set up DEBP.  Patient Name: Kristy Hines JKDTO'I Date: 03/01/2019     Maternal Data    Feeding Feeding Type: Formula  LATCH Score                   Interventions    Lactation Tools Discussed/Used     Consult Status      Aleeha Boline G 03/01/2019, 1:31 AM

## 2019-03-01 NOTE — Progress Notes (Signed)
Pt still feeling some pelvic pressure this am, but better than last night.  No F/C/CP/SOB.  Tolerating clears with some crackers.  No nausea/vomiting.  Peri pad with appropriate bleeding for recent delivery.  Plan to continue with management as currently outlined.  Labs appears overall stable, will repeat again at 12pm, if still stable plan to transfer to back to [place], [place] (cell) [phone] (office)

## 2019-03-01 NOTE — Progress Notes (Signed)
Pt transferred from [facility] to [facility] via wheelchair. Pt accompanied by RN. Pt oriented to room, call light and phone. Pt settled into bed, c/o 9/10 pain. VSS. Will continue to monitor.

## 2019-03-01 NOTE — Progress Notes (Signed)
[place] Progress Note Patient Name: Kristy Hines DOB: Jan 22, 1981 MRN: [id number]   Date of Service  03/01/2019  HPI/Events of Note  Asking for pain meds.  OBGYN rapid nurse has evaluated at bed side.  Hg 7.6 stable. Fibrinogen 288. VS stable.   eICU Interventions  - Tylenol liquid 10 ml once oral.      Intervention Category Intermediate Interventions: Pain - evaluation and management;Diagnostic test evaluation  Elmer Sow 03/01/2019, 6:39 AM

## 2019-03-01 NOTE — Progress Notes (Signed)
Dr. Nelda Marseille called to check in on patient. I informed her that she was improving, still sore, bleeding scant, UO 450 approximately. She was good with this and stated okay to remove foley in early am if still putting out adequate urine hourly. Okay to leave in and she will assess at rounds in the morning if any questions. She gave a verbal for a CMP order for 03/01/09 0500. Order placed.

## 2019-03-01 NOTE — Progress Notes (Signed)
Notified Dr Nelda Marseille of pt getting up to ambulate in room with staff. Pt c/o pain to vaginal/peri area with sitting at EOB, and while walking. Noted very small amount of blood running down leg when standing, no clots, pad had moderate amount of blood (last changed around 1100 this am by RN Meghan from [facility]).  Pt tolerated walk fairly well despite discomfort to periarea when walking (pain medication on board).  RN Meghan assisting with pt during this time and assessed pt's fundus.    Labs back and reported to Dr Nelda Marseille, hgb 7.0, platelets 53, fibrinogen 368,  INR 1.3, and PTT 34.   Inquired about foley and epidural removal. Dr Nelda Marseille does not want foley pulled at this time, she states anesthesia not planning to pull epidural until tomorrow, and will order more labs for tomorrow morning.  Plan to move pt back to [facility] unit today, she will contact and discuss with Dr Landry Mellow and will place orders in Epic.

## 2019-03-01 NOTE — Progress Notes (Signed)
[place] Progress Note Patient Name: Kristy Hines DOB: 1981/08/26 MRN: [id number]   Date of Service  03/01/2019  HPI/Events of Note  Still has vag bleeding, lower abd pain. Earlier in ED fenta IV worked well. VS stable. On 2 lit o2.   eICU Interventions  Fentanyl 25 mcg once IV. For pain ordered. On pitocin. Finished PRBC and PLT. Follow labs at 5 am.      Intervention Category Intermediate Interventions: Pain - evaluation and management  Elmer Sow 03/01/2019, 3:44 AM

## 2019-03-01 NOTE — Progress Notes (Signed)
Referring Physician(s): Dr Katharine Look  Supervising Physician: Gilmer Mor  Patient Status:  [facility] - In-pt  Chief Complaint:  S/p bilateral gelfoam embo of the peripheral internal iliac arteries at the uterine branches Active bleeding from small rt uterine branches successful occluded with gelfoam embo  Subjective:  Pt is still tired and achy Cramping in low abd Eating some now Little to no nausea   Allergies: Patient has no known allergies.  Medications: Prior to Admission medications   Medication Sig Start Date End Date Taking? Authorizing Provider  Multiple Vitamin (MULTIVITAMIN) tablet Take 1 tablet by mouth daily.    [provider]  oxyCODONE-acetaminophen (PERCOCET) 5-325 MG tablet Take 1 tablet by mouth every 6 (six) hours as needed for severe pain. Patient not taking: Reported on 09/28/2018 04/22/18   Jerre Simon, PA  SPRINTEC 28 0.25-35 MG-MCG tablet Take 1 tablet by mouth daily. 01/28/18   [provider]     Vital Signs: BP 108/64    Pulse 77    Temp 97.8 F (36.6 C) (Oral)    Resp 18    Ht  (1.676 m)    Wt 236 lb 3.2 oz (107.1 kg)    LMP 05/26/2018    SpO2 99%    Breastfeeding Unknown    BMI 38.12 kg/m   Physical Exam Vitals signs reviewed.  Abdominal:     Palpations: Abdomen is soft.     Tenderness: There is abdominal tenderness.  Skin:    General: Skin is warm and dry.     Comments: Bilateral groin sites are clean and dry NT no hematoma C/d/i  Bilat foot pulses 2+    Neurological:     Mental Status: She is alert and oriented to person, place, and time.     Imaging: Ir Angiogram Pelvis Selective Or Supraselective  Result Date: 03/01/2019 INDICATION: Postpartum hemorrhage, vaginal wall and cervical lacerations, active bleeding requiring transfusion protocol EXAM: Ultrasound guidance for vascular access Bilateral internal iliac catheterizations, angiograms, and Gelfoam embolizations MEDICATIONS: 1% lidocaine local.  ANESTHESIA/SEDATION: Moderate (conscious) sedation was employed during this procedure. A total of Versed 2.0 mg and Fentanyl 100 mcg was administered intravenously. Moderate Sedation Time: 65 minutes. The patient's level of consciousness and vital signs were monitored continuously by radiology nursing throughout the procedure under my direct supervision. CONTRAST:  75 cc Omnipaque 300 FLUOROSCOPY TIME:  Fluoroscopy Time: 19 minutes 24 seconds (3,292 mGy). COMPLICATIONS: None immediate. PROCEDURE: Informed consent was obtained from the patient following explanation of the procedure, risks, benefits and alternatives. The patient understands, agrees and consents for the procedure. All questions were addressed. A time out was performed prior to the initiation of the procedure. Maximal barrier sterile technique utilized including caps, mask, sterile gowns, sterile gloves, large sterile drape, hand hygiene, and Betadine prep. Under sterile conditions and local anesthesia, ultrasound micropuncture access performed of the common femoral arteries bilaterally. Images obtained for documentation of the patent common femoral arteries. Five French sheaths inserted bilaterally. C2 catheters advanced into the internal iliac arteries bilaterally. Initially, the right internal iliac artery was evaluated. Right internal iliac angiograms performed. Right internal iliac: There is active contrast extravasation consistent with arterial bleeding along the right hemipelvis from small vaginal/cervical branches off of the anterior division of the right internal iliac artery where the inferior gluteal, obturator and pudendal branches are located. The dominant small bleeding vaginal/cervical branch appears to be off of the inferior gluteal artery. Right internal iliac Gelfoam embolization: Renegade high flow  microcatheter advanced to the trifurcation of the inferior gluteal, obturator and pudendal branches. Repeat angiogram confirms active  arterial bleeding in this region. Gelfoam embolization performed. Following embolization, repeat angiogram through the microcatheter at the embolization site demonstrates no further active arterial bleeding in this distribution. There is diminished flow throughout the right inferior gluteal, obturator and pudendal branches. Attempts were made to catheterize the very tortuous origin of the right uterine artery however this was unsuccessful. However, no further active bleeding demonstrated from the right internal iliac artery. Next, the left internal iliac artery was evaluated. Left internal iliac angiogram performed. Left internal iliac: The left internal iliac branches are all patent including the supragluteal, inferior gluteal, obturator, pudendal common uterine branches. Uterine vasculature is hypertrophied because of the immediate postpartum state. C2 catheter was advanced into the anterior division at the branching point of the inferior gluteal, obturator, pudendal, and uterine vasculature. Angiograms as this location demonstrated no active bleeding however because of the ongoing postpartum hemorrhage left internal iliac Gelfoam embolization was also performed. Left internal iliac Gelfoam embolization: At the branch point of the inferior gluteal, obturator, pudendal and uterine branches Gel-Foam slurry embolization was performed. Following embolization, there is diminished arterial flow throughout the left internal iliac anterior division distribution. Catheter removed. Hemostasis obtained at both puncture sites with ExoSeal device. No immediate complication. Patient tolerated the procedure well. She will be transferred to the ICU for overnight observation and continued transfusion. IMPRESSION: Positive exam for active arterial bleeding in the right hemipelvis from small vaginal/cervical branches of the right inferior gluteal, obturator, and pudendal vascular territory. Successful bilateral anterior internal  iliac artery Gelfoam embolization as detailed above. Electronically Signed   By: Jerilynn Mages.  Shick M.D.   On: 03/01/2019 08:48   Ir Angiogram Pelvis Selective Or Supraselective  Result Date: 03/01/2019 INDICATION: Postpartum hemorrhage, vaginal wall and cervical lacerations, active bleeding requiring transfusion protocol EXAM: Ultrasound guidance for vascular access Bilateral internal iliac catheterizations, angiograms, and Gelfoam embolizations MEDICATIONS: 1% lidocaine local. ANESTHESIA/SEDATION: Moderate (conscious) sedation was employed during this procedure. A total of Versed 2.0 mg and Fentanyl 100 mcg was administered intravenously. Moderate Sedation Time: 65 minutes. The patient's level of consciousness and vital signs were monitored continuously by radiology nursing throughout the procedure under my direct supervision. CONTRAST:  75 cc Omnipaque 300 FLUOROSCOPY TIME:  Fluoroscopy Time: 19 minutes 24 seconds (3,292 mGy). COMPLICATIONS: None immediate. PROCEDURE: Informed consent was obtained from the patient following explanation of the procedure, risks, benefits and alternatives. The patient understands, agrees and consents for the procedure. All questions were addressed. A time out was performed prior to the initiation of the procedure. Maximal barrier sterile technique utilized including caps, mask, sterile gowns, sterile gloves, large sterile drape, hand hygiene, and Betadine prep. Under sterile conditions and local anesthesia, ultrasound micropuncture access performed of the common femoral arteries bilaterally. Images obtained for documentation of the patent common femoral arteries. Five French sheaths inserted bilaterally. C2 catheters advanced into the internal iliac arteries bilaterally. Initially, the right internal iliac artery was evaluated. Right internal iliac angiograms performed. Right internal iliac: There is active contrast extravasation consistent with arterial bleeding along the right  hemipelvis from small vaginal/cervical branches off of the anterior division of the right internal iliac artery where the inferior gluteal, obturator and pudendal branches are located. The dominant small bleeding vaginal/cervical branch appears to be off of the inferior gluteal artery. Right internal iliac Gelfoam embolization: Renegade high flow microcatheter advanced to the trifurcation of the inferior gluteal, obturator and  pudendal branches. Repeat angiogram confirms active arterial bleeding in this region. Gelfoam embolization performed. Following embolization, repeat angiogram through the microcatheter at the embolization site demonstrates no further active arterial bleeding in this distribution. There is diminished flow throughout the right inferior gluteal, obturator and pudendal branches. Attempts were made to catheterize the very tortuous origin of the right uterine artery however this was unsuccessful. However, no further active bleeding demonstrated from the right internal iliac artery. Next, the left internal iliac artery was evaluated. Left internal iliac angiogram performed. Left internal iliac: The left internal iliac branches are all patent including the supragluteal, inferior gluteal, obturator, pudendal common uterine branches. Uterine vasculature is hypertrophied because of the immediate postpartum state. C2 catheter was advanced into the anterior division at the branching point of the inferior gluteal, obturator, pudendal, and uterine vasculature. Angiograms as this location demonstrated no active bleeding however because of the ongoing postpartum hemorrhage left internal iliac Gelfoam embolization was also performed. Left internal iliac Gelfoam embolization: At the branch point of the inferior gluteal, obturator, pudendal and uterine branches Gel-Foam slurry embolization was performed. Following embolization, there is diminished arterial flow throughout the left internal iliac anterior division  distribution. Catheter removed. Hemostasis obtained at both puncture sites with ExoSeal device. No immediate complication. Patient tolerated the procedure well. She will be transferred to the ICU for overnight observation and continued transfusion. IMPRESSION: Positive exam for active arterial bleeding in the right hemipelvis from small vaginal/cervical branches of the right inferior gluteal, obturator, and pudendal vascular territory. Successful bilateral anterior internal iliac artery Gelfoam embolization as detailed above. Electronically Signed   By: Judie Petit.  Shick M.D.   On: 03/01/2019 08:48   Ir Angiogram Selective Each Additional Vessel  Result Date: 03/01/2019 INDICATION: Postpartum hemorrhage, vaginal wall and cervical lacerations, active bleeding requiring transfusion protocol EXAM: Ultrasound guidance for vascular access Bilateral internal iliac catheterizations, angiograms, and Gelfoam embolizations MEDICATIONS: 1% lidocaine local. ANESTHESIA/SEDATION: Moderate (conscious) sedation was employed during this procedure. A total of Versed 2.0 mg and Fentanyl 100 mcg was administered intravenously. Moderate Sedation Time: 65 minutes. The patient's level of consciousness and vital signs were monitored continuously by radiology nursing throughout the procedure under my direct supervision. CONTRAST:  75 cc Omnipaque 300 FLUOROSCOPY TIME:  Fluoroscopy Time: 19 minutes 24 seconds (3,292 mGy). COMPLICATIONS: None immediate. PROCEDURE: Informed consent was obtained from the patient following explanation of the procedure, risks, benefits and alternatives. The patient understands, agrees and consents for the procedure. All questions were addressed. A time out was performed prior to the initiation of the procedure. Maximal barrier sterile technique utilized including caps, mask, sterile gowns, sterile gloves, large sterile drape, hand hygiene, and Betadine prep. Under sterile conditions and local anesthesia, ultrasound  micropuncture access performed of the common femoral arteries bilaterally. Images obtained for documentation of the patent common femoral arteries. Five French sheaths inserted bilaterally. C2 catheters advanced into the internal iliac arteries bilaterally. Initially, the right internal iliac artery was evaluated. Right internal iliac angiograms performed. Right internal iliac: There is active contrast extravasation consistent with arterial bleeding along the right hemipelvis from small vaginal/cervical branches off of the anterior division of the right internal iliac artery where the inferior gluteal, obturator and pudendal branches are located. The dominant small bleeding vaginal/cervical branch appears to be off of the inferior gluteal artery. Right internal iliac Gelfoam embolization: Renegade high flow microcatheter advanced to the trifurcation of the inferior gluteal, obturator and pudendal branches. Repeat angiogram confirms active arterial bleeding in this region. Gelfoam  embolization performed. Following embolization, repeat angiogram through the microcatheter at the embolization site demonstrates no further active arterial bleeding in this distribution. There is diminished flow throughout the right inferior gluteal, obturator and pudendal branches. Attempts were made to catheterize the very tortuous origin of the right uterine artery however this was unsuccessful. However, no further active bleeding demonstrated from the right internal iliac artery. Next, the left internal iliac artery was evaluated. Left internal iliac angiogram performed. Left internal iliac: The left internal iliac branches are all patent including the supragluteal, inferior gluteal, obturator, pudendal common uterine branches. Uterine vasculature is hypertrophied because of the immediate postpartum state. C2 catheter was advanced into the anterior division at the branching point of the inferior gluteal, obturator, pudendal, and uterine  vasculature. Angiograms as this location demonstrated no active bleeding however because of the ongoing postpartum hemorrhage left internal iliac Gelfoam embolization was also performed. Left internal iliac Gelfoam embolization: At the branch point of the inferior gluteal, obturator, pudendal and uterine branches Gel-Foam slurry embolization was performed. Following embolization, there is diminished arterial flow throughout the left internal iliac anterior division distribution. Catheter removed. Hemostasis obtained at both puncture sites with ExoSeal device. No immediate complication. Patient tolerated the procedure well. She will be transferred to the ICU for overnight observation and continued transfusion. IMPRESSION: Positive exam for active arterial bleeding in the right hemipelvis from small vaginal/cervical branches of the right inferior gluteal, obturator, and pudendal vascular territory. Successful bilateral anterior internal iliac artery Gelfoam embolization as detailed above. Electronically Signed   By: Judie PetitM.  Shick M.D.   On: 03/01/2019 08:48   Ir Angiogram Selective Each Additional Vessel  Result Date: 03/01/2019 INDICATION: Postpartum hemorrhage, vaginal wall and cervical lacerations, active bleeding requiring transfusion protocol EXAM: Ultrasound guidance for vascular access Bilateral internal iliac catheterizations, angiograms, and Gelfoam embolizations MEDICATIONS: 1% lidocaine local. ANESTHESIA/SEDATION: Moderate (conscious) sedation was employed during this procedure. A total of Versed 2.0 mg and Fentanyl 100 mcg was administered intravenously. Moderate Sedation Time: 65 minutes. The patient's level of consciousness and vital signs were monitored continuously by radiology nursing throughout the procedure under my direct supervision. CONTRAST:  75 cc Omnipaque 300 FLUOROSCOPY TIME:  Fluoroscopy Time: 19 minutes 24 seconds (3,292 mGy). COMPLICATIONS: None immediate. PROCEDURE: Informed consent was  obtained from the patient following explanation of the procedure, risks, benefits and alternatives. The patient understands, agrees and consents for the procedure. All questions were addressed. A time out was performed prior to the initiation of the procedure. Maximal barrier sterile technique utilized including caps, mask, sterile gowns, sterile gloves, large sterile drape, hand hygiene, and Betadine prep. Under sterile conditions and local anesthesia, ultrasound micropuncture access performed of the common femoral arteries bilaterally. Images obtained for documentation of the patent common femoral arteries. Five French sheaths inserted bilaterally. C2 catheters advanced into the internal iliac arteries bilaterally. Initially, the right internal iliac artery was evaluated. Right internal iliac angiograms performed. Right internal iliac: There is active contrast extravasation consistent with arterial bleeding along the right hemipelvis from small vaginal/cervical branches off of the anterior division of the right internal iliac artery where the inferior gluteal, obturator and pudendal branches are located. The dominant small bleeding vaginal/cervical branch appears to be off of the inferior gluteal artery. Right internal iliac Gelfoam embolization: Renegade high flow microcatheter advanced to the trifurcation of the inferior gluteal, obturator and pudendal branches. Repeat angiogram confirms active arterial bleeding in this region. Gelfoam embolization performed. Following embolization, repeat angiogram through the microcatheter at the embolization  site demonstrates no further active arterial bleeding in this distribution. There is diminished flow throughout the right inferior gluteal, obturator and pudendal branches. Attempts were made to catheterize the very tortuous origin of the right uterine artery however this was unsuccessful. However, no further active bleeding demonstrated from the right internal iliac  artery. Next, the left internal iliac artery was evaluated. Left internal iliac angiogram performed. Left internal iliac: The left internal iliac branches are all patent including the supragluteal, inferior gluteal, obturator, pudendal common uterine branches. Uterine vasculature is hypertrophied because of the immediate postpartum state. C2 catheter was advanced into the anterior division at the branching point of the inferior gluteal, obturator, pudendal, and uterine vasculature. Angiograms as this location demonstrated no active bleeding however because of the ongoing postpartum hemorrhage left internal iliac Gelfoam embolization was also performed. Left internal iliac Gelfoam embolization: At the branch point of the inferior gluteal, obturator, pudendal and uterine branches Gel-Foam slurry embolization was performed. Following embolization, there is diminished arterial flow throughout the left internal iliac anterior division distribution. Catheter removed. Hemostasis obtained at both puncture sites with ExoSeal device. No immediate complication. Patient tolerated the procedure well. She will be transferred to the ICU for overnight observation and continued transfusion. IMPRESSION: Positive exam for active arterial bleeding in the right hemipelvis from small vaginal/cervical branches of the right inferior gluteal, obturator, and pudendal vascular territory. Successful bilateral anterior internal iliac artery Gelfoam embolization as detailed above. Electronically Signed   By: Judie Petit.  Shick M.D.   On: 03/01/2019 08:48   Ir Angiogram Selective Each Additional Vessel  Result Date: 03/01/2019 INDICATION: Postpartum hemorrhage, vaginal wall and cervical lacerations, active bleeding requiring transfusion protocol EXAM: Ultrasound guidance for vascular access Bilateral internal iliac catheterizations, angiograms, and Gelfoam embolizations MEDICATIONS: 1% lidocaine local. ANESTHESIA/SEDATION: Moderate (conscious)  sedation was employed during this procedure. A total of Versed 2.0 mg and Fentanyl 100 mcg was administered intravenously. Moderate Sedation Time: 65 minutes. The patient's level of consciousness and vital signs were monitored continuously by radiology nursing throughout the procedure under my direct supervision. CONTRAST:  75 cc Omnipaque 300 FLUOROSCOPY TIME:  Fluoroscopy Time: 19 minutes 24 seconds (3,292 mGy). COMPLICATIONS: None immediate. PROCEDURE: Informed consent was obtained from the patient following explanation of the procedure, risks, benefits and alternatives. The patient understands, agrees and consents for the procedure. All questions were addressed. A time out was performed prior to the initiation of the procedure. Maximal barrier sterile technique utilized including caps, mask, sterile gowns, sterile gloves, large sterile drape, hand hygiene, and Betadine prep. Under sterile conditions and local anesthesia, ultrasound micropuncture access performed of the common femoral arteries bilaterally. Images obtained for documentation of the patent common femoral arteries. Five French sheaths inserted bilaterally. C2 catheters advanced into the internal iliac arteries bilaterally. Initially, the right internal iliac artery was evaluated. Right internal iliac angiograms performed. Right internal iliac: There is active contrast extravasation consistent with arterial bleeding along the right hemipelvis from small vaginal/cervical branches off of the anterior division of the right internal iliac artery where the inferior gluteal, obturator and pudendal branches are located. The dominant small bleeding vaginal/cervical branch appears to be off of the inferior gluteal artery. Right internal iliac Gelfoam embolization: Renegade high flow microcatheter advanced to the trifurcation of the inferior gluteal, obturator and pudendal branches. Repeat angiogram confirms active arterial bleeding in this region. Gelfoam  embolization performed. Following embolization, repeat angiogram through the microcatheter at the embolization site demonstrates no further active arterial bleeding in this distribution. There  is diminished flow throughout the right inferior gluteal, obturator and pudendal branches. Attempts were made to catheterize the very tortuous origin of the right uterine artery however this was unsuccessful. However, no further active bleeding demonstrated from the right internal iliac artery. Next, the left internal iliac artery was evaluated. Left internal iliac angiogram performed. Left internal iliac: The left internal iliac branches are all patent including the supragluteal, inferior gluteal, obturator, pudendal common uterine branches. Uterine vasculature is hypertrophied because of the immediate postpartum state. C2 catheter was advanced into the anterior division at the branching point of the inferior gluteal, obturator, pudendal, and uterine vasculature. Angiograms as this location demonstrated no active bleeding however because of the ongoing postpartum hemorrhage left internal iliac Gelfoam embolization was also performed. Left internal iliac Gelfoam embolization: At the branch point of the inferior gluteal, obturator, pudendal and uterine branches Gel-Foam slurry embolization was performed. Following embolization, there is diminished arterial flow throughout the left internal iliac anterior division distribution. Catheter removed. Hemostasis obtained at both puncture sites with ExoSeal device. No immediate complication. Patient tolerated the procedure well. She will be transferred to the ICU for overnight observation and continued transfusion. IMPRESSION: Positive exam for active arterial bleeding in the right hemipelvis from small vaginal/cervical branches of the right inferior gluteal, obturator, and pudendal vascular territory. Successful bilateral anterior internal iliac artery Gelfoam embolization as detailed  above. Electronically Signed   By: Judie Petit.  Shick M.D.   On: 03/01/2019 08:48   Ir US Guide Vasc Access Left  Result Date: 03/01/2019 INDICATION: Postpartum hemorrhage, vaginal wall and cervical lacerations, active bleeding requiring transfusion protocol EXAM: Ultrasound guidance for vascular access Bilateral internal iliac catheterizations, angiograms, and Gelfoam embolizations MEDICATIONS: 1% lidocaine local. ANESTHESIA/SEDATION: Moderate (conscious) sedation was employed during this procedure. A total of Versed 2.0 mg and Fentanyl 100 mcg was administered intravenously. Moderate Sedation Time: 65 minutes. The patient's level of consciousness and vital signs were monitored continuously by radiology nursing throughout the procedure under my direct supervision. CONTRAST:  75 cc Omnipaque 300 FLUOROSCOPY TIME:  Fluoroscopy Time: 19 minutes 24 seconds (3,292 mGy). COMPLICATIONS: None immediate. PROCEDURE: Informed consent was obtained from the patient following explanation of the procedure, risks, benefits and alternatives. The patient understands, agrees and consents for the procedure. All questions were addressed. A time out was performed prior to the initiation of the procedure. Maximal barrier sterile technique utilized including caps, mask, sterile gowns, sterile gloves, large sterile drape, hand hygiene, and Betadine prep. Under sterile conditions and local anesthesia, ultrasound micropuncture access performed of the common femoral arteries bilaterally. Images obtained for documentation of the patent common femoral arteries. Five French sheaths inserted bilaterally. C2 catheters advanced into the internal iliac arteries bilaterally. Initially, the right internal iliac artery was evaluated. Right internal iliac angiograms performed. Right internal iliac: There is active contrast extravasation consistent with arterial bleeding along the right hemipelvis from small vaginal/cervical branches off of the anterior  division of the right internal iliac artery where the inferior gluteal, obturator and pudendal branches are located. The dominant small bleeding vaginal/cervical branch appears to be off of the inferior gluteal artery. Right internal iliac Gelfoam embolization: Renegade high flow microcatheter advanced to the trifurcation of the inferior gluteal, obturator and pudendal branches. Repeat angiogram confirms active arterial bleeding in this region. Gelfoam embolization performed. Following embolization, repeat angiogram through the microcatheter at the embolization site demonstrates no further active arterial bleeding in this distribution. There is diminished flow throughout the right inferior gluteal, obturator and pudendal branches.  Attempts were made to catheterize the very tortuous origin of the right uterine artery however this was unsuccessful. However, no further active bleeding demonstrated from the right internal iliac artery. Next, the left internal iliac artery was evaluated. Left internal iliac angiogram performed. Left internal iliac: The left internal iliac branches are all patent including the supragluteal, inferior gluteal, obturator, pudendal common uterine branches. Uterine vasculature is hypertrophied because of the immediate postpartum state. C2 catheter was advanced into the anterior division at the branching point of the inferior gluteal, obturator, pudendal, and uterine vasculature. Angiograms as this location demonstrated no active bleeding however because of the ongoing postpartum hemorrhage left internal iliac Gelfoam embolization was also performed. Left internal iliac Gelfoam embolization: At the branch point of the inferior gluteal, obturator, pudendal and uterine branches Gel-Foam slurry embolization was performed. Following embolization, there is diminished arterial flow throughout the left internal iliac anterior division distribution. Catheter removed. Hemostasis obtained at both  puncture sites with ExoSeal device. No immediate complication. Patient tolerated the procedure well. She will be transferred to the ICU for overnight observation and continued transfusion. IMPRESSION: Positive exam for active arterial bleeding in the right hemipelvis from small vaginal/cervical branches of the right inferior gluteal, obturator, and pudendal vascular territory. Successful bilateral anterior internal iliac artery Gelfoam embolization as detailed above. Electronically Signed   By: Judie PetitM.  Shick M.D.   On: 03/01/2019 08:48   Ir [place] Guide Vasc Access Right  Result Date: 03/01/2019 INDICATION: Postpartum hemorrhage, vaginal wall and cervical lacerations, active bleeding requiring transfusion protocol EXAM: Ultrasound guidance for vascular access Bilateral internal iliac catheterizations, angiograms, and Gelfoam embolizations MEDICATIONS: 1% lidocaine local. ANESTHESIA/SEDATION: Moderate (conscious) sedation was employed during this procedure. A total of Versed 2.0 mg and Fentanyl 100 mcg was administered intravenously. Moderate Sedation Time: 65 minutes. The patient's level of consciousness and vital signs were monitored continuously by radiology nursing throughout the procedure under my direct supervision. CONTRAST:  75 cc Omnipaque 300 FLUOROSCOPY TIME:  Fluoroscopy Time: 19 minutes 24 seconds (3,292 mGy). COMPLICATIONS: None immediate. PROCEDURE: Informed consent was obtained from the patient following explanation of the procedure, risks, benefits and alternatives. The patient understands, agrees and consents for the procedure. All questions were addressed. A time out was performed prior to the initiation of the procedure. Maximal barrier sterile technique utilized including caps, mask, sterile gowns, sterile gloves, large sterile drape, hand hygiene, and Betadine prep. Under sterile conditions and local anesthesia, ultrasound micropuncture access performed of the common femoral arteries bilaterally.  Images obtained for documentation of the patent common femoral arteries. Five French sheaths inserted bilaterally. C2 catheters advanced into the internal iliac arteries bilaterally. Initially, the right internal iliac artery was evaluated. Right internal iliac angiograms performed. Right internal iliac: There is active contrast extravasation consistent with arterial bleeding along the right hemipelvis from small vaginal/cervical branches off of the anterior division of the right internal iliac artery where the inferior gluteal, obturator and pudendal branches are located. The dominant small bleeding vaginal/cervical branch appears to be off of the inferior gluteal artery. Right internal iliac Gelfoam embolization: Renegade high flow microcatheter advanced to the trifurcation of the inferior gluteal, obturator and pudendal branches. Repeat angiogram confirms active arterial bleeding in this region. Gelfoam embolization performed. Following embolization, repeat angiogram through the microcatheter at the embolization site demonstrates no further active arterial bleeding in this distribution. There is diminished flow throughout the right inferior gluteal, obturator and pudendal branches. Attempts were made to catheterize the very tortuous origin of the right  uterine artery however this was unsuccessful. However, no further active bleeding demonstrated from the right internal iliac artery. Next, the left internal iliac artery was evaluated. Left internal iliac angiogram performed. Left internal iliac: The left internal iliac branches are all patent including the supragluteal, inferior gluteal, obturator, pudendal common uterine branches. Uterine vasculature is hypertrophied because of the immediate postpartum state. C2 catheter was advanced into the anterior division at the branching point of the inferior gluteal, obturator, pudendal, and uterine vasculature. Angiograms as this location demonstrated no active bleeding  however because of the ongoing postpartum hemorrhage left internal iliac Gelfoam embolization was also performed. Left internal iliac Gelfoam embolization: At the branch point of the inferior gluteal, obturator, pudendal and uterine branches Gel-Foam slurry embolization was performed. Following embolization, there is diminished arterial flow throughout the left internal iliac anterior division distribution. Catheter removed. Hemostasis obtained at both puncture sites with ExoSeal device. No immediate complication. Patient tolerated the procedure well. She will be transferred to the ICU for overnight observation and continued transfusion. IMPRESSION: Positive exam for active arterial bleeding in the right hemipelvis from small vaginal/cervical branches of the right inferior gluteal, obturator, and pudendal vascular territory. Successful bilateral anterior internal iliac artery Gelfoam embolization as detailed above. Electronically Signed   By: Judie PetitM.  Shick M.D.   On: 03/01/2019 08:48   Ir Embo Lennox SoldersArt  Ven Hemorr Lymph Express ScriptsExtrav  Inc Guide Roadmapping  Result Date: 03/01/2019 INDICATION: Postpartum hemorrhage, vaginal wall and cervical lacerations, active bleeding requiring transfusion protocol EXAM: Ultrasound guidance for vascular access Bilateral internal iliac catheterizations, angiograms, and Gelfoam embolizations MEDICATIONS: 1% lidocaine local. ANESTHESIA/SEDATION: Moderate (conscious) sedation was employed during this procedure. A total of Versed 2.0 mg and Fentanyl 100 mcg was administered intravenously. Moderate Sedation Time: 65 minutes. The patient's level of consciousness and vital signs were monitored continuously by radiology nursing throughout the procedure under my direct supervision. CONTRAST:  75 cc Omnipaque 300 FLUOROSCOPY TIME:  Fluoroscopy Time: 19 minutes 24 seconds (3,292 mGy). COMPLICATIONS: None immediate. PROCEDURE: Informed consent was obtained from the patient following explanation of the  procedure, risks, benefits and alternatives. The patient understands, agrees and consents for the procedure. All questions were addressed. A time out was performed prior to the initiation of the procedure. Maximal barrier sterile technique utilized including caps, mask, sterile gowns, sterile gloves, large sterile drape, hand hygiene, and Betadine prep. Under sterile conditions and local anesthesia, ultrasound micropuncture access performed of the common femoral arteries bilaterally. Images obtained for documentation of the patent common femoral arteries. Five French sheaths inserted bilaterally. C2 catheters advanced into the internal iliac arteries bilaterally. Initially, the right internal iliac artery was evaluated. Right internal iliac angiograms performed. Right internal iliac: There is active contrast extravasation consistent with arterial bleeding along the right hemipelvis from small vaginal/cervical branches off of the anterior division of the right internal iliac artery where the inferior gluteal, obturator and pudendal branches are located. The dominant small bleeding vaginal/cervical branch appears to be off of the inferior gluteal artery. Right internal iliac Gelfoam embolization: Renegade high flow microcatheter advanced to the trifurcation of the inferior gluteal, obturator and pudendal branches. Repeat angiogram confirms active arterial bleeding in this region. Gelfoam embolization performed. Following embolization, repeat angiogram through the microcatheter at the embolization site demonstrates no further active arterial bleeding in this distribution. There is diminished flow throughout the right inferior gluteal, obturator and pudendal branches. Attempts were made to catheterize the very tortuous origin of the right uterine artery however this was unsuccessful.  However, no further active bleeding demonstrated from the right internal iliac artery. Next, the left internal iliac artery was evaluated.  Left internal iliac angiogram performed. Left internal iliac: The left internal iliac branches are all patent including the supragluteal, inferior gluteal, obturator, pudendal common uterine branches. Uterine vasculature is hypertrophied because of the immediate postpartum state. C2 catheter was advanced into the anterior division at the branching point of the inferior gluteal, obturator, pudendal, and uterine vasculature. Angiograms as this location demonstrated no active bleeding however because of the ongoing postpartum hemorrhage left internal iliac Gelfoam embolization was also performed. Left internal iliac Gelfoam embolization: At the branch point of the inferior gluteal, obturator, pudendal and uterine branches Gel-Foam slurry embolization was performed. Following embolization, there is diminished arterial flow throughout the left internal iliac anterior division distribution. Catheter removed. Hemostasis obtained at both puncture sites with ExoSeal device. No immediate complication. Patient tolerated the procedure well. She will be transferred to the ICU for overnight observation and continued transfusion. IMPRESSION: Positive exam for active arterial bleeding in the right hemipelvis from small vaginal/cervical branches of the right inferior gluteal, obturator, and pudendal vascular territory. Successful bilateral anterior internal iliac artery Gelfoam embolization as detailed above. Electronically Signed   By: Judie Petit.  Shick M.D.   On: 03/01/2019 08:48    Labs:  CBC: Recent Labs    02/28/19 0020 02/28/19 1831 02/28/19 1904 02/28/19 2314 03/01/19 0348  WBC 8.1  --  16.3* 12.9* 13.4*  HGB 12.1 9.5* 7.0* 7.2* 7.6*  HCT 37.0 28.0* 21.5* 21.1* 22.2*  PLT 224  --  118*   117* 67* 61*    COAGS: Recent Labs    02/28/19 1904 02/28/19 2314 03/01/19 0348  INR 1.9* 1.4* 1.4*  APTT 56* 42* 43*    BMP: Recent Labs    04/19/18 1538 04/20/18 0429 04/22/18 1004 02/28/19 1831 03/01/19 0348    NA 140 138 140 135 138  K 4.3 3.6 3.9 4.5 4.7  CL 107 107 107  --  112*  CO2 --  18*  GLUCOSE 93 86 140*  --  109*  BUN --  16  CALCIUM 8.5* 8.3* 8.6*  --  7.2*  CREATININE 0.96 0.71 0.85  --  1.11*  GFRNONAA >60 >60 >60  --  >60  GFRAA >60 >60 >60  --  >60    LIVER FUNCTION TESTS: Recent Labs    04/19/18 1538 04/20/18 0429 04/22/18 1004 03/01/19 0348  BILITOT 1.9* 1.3* 0.5 1.5*  AST 99* 85* 28 32  ALT 73* 70* 41 11  ALKPHOS 141* 136* 132* 93  PROT 6.1* 5.1* 5.3* 4.1*  ALBUMIN 3.3* 2.7* 2.7* 2.3*    Assessment and Plan:  Uterine artery embolization 6/29 pm Dr Miles Costain IR Doing well Has had no transfusions since MN or so Hg 7.2--- lab here to draw new Hg now Will recheck in am Call if need [place]  Electronically Signed: Robet Leu, PA-C 03/01/2019, 2:34 PM   I spent a total of 15 Minutes at the the patient's bedside AND on the patient's hospital floor or unit, greater than 50% of which was counseling/coordinating care for [place]

## 2019-03-01 NOTE — Anesthesia Postprocedure Evaluation (Signed)
Anesthesia Post Note  Patient: Kristy Hines  Procedure(s) Performed: AN AD [place]     Patient location during evaluation: Women's Unit Anesthesia Type: Epidural Level of consciousness: awake, awake and alert and oriented Pain management: pain level controlled Vital Signs Assessment: post-procedure vital signs reviewed and stable Respiratory status: spontaneous breathing and respiratory function stable Cardiovascular status: blood pressure returned to baseline Postop Assessment: no headache, epidural receding, patient able to bend at knees, adequate PO intake, no backache, no apparent nausea or vomiting and able to ambulate Anesthetic complications: no    Last Vitals:  Vitals:   03/01/19 1700 03/01/19 1743  BP: (!) 107/56   Pulse:    Resp: 15 18  Temp:  (!) 36.4 C  SpO2:  100%    Last Pain:  Vitals:   03/01/19 1743  TempSrc: Oral  PainSc:    Pain Goal:                   Bufford Spikes

## 2019-03-01 NOTE — Progress Notes (Signed)
Lab called around 2000 to inform me about pt receiving Rh + plt with the MTP she received earlier today. She asked for Alvira Philips, RN or myself to call the doctor and ask for rhogam order since pt is O neg. We called Dr. Landry Mellow and she okayed a verbal order for rhogam. The order was unclear in the computer (2 choices) so we called the blood bank and Bonnita Nasuti the tech stated she would put in the order and I gave her the verbal order and doctor information. She reported she would call back when ready for pick up.

## 2019-03-01 NOTE — Progress Notes (Signed)
Report given to RN Pam from high risk OB unit, pt will transfer to room 1S03.

## 2019-03-01 NOTE — Progress Notes (Signed)
Pt transferred via wheelchair with belongings to Women's high risk OB unit, room 1S03. Escorted by unit charge nurse.

## 2019-03-01 NOTE — Progress Notes (Signed)
D/w dr Nelda Marseille  For labs about midday  Monitor for bleeding  If labs trending positively and no further bleeding, may go back to obstetrics floor

## 2019-03-02 ENCOUNTER — Encounter (HOSPITAL_COMMUNITY): Payer: Self-pay

## 2019-03-02 LAB — COMPREHENSIVE METABOLIC PANEL
ALT: 10 U/L (ref 0–44)
AST: 20 U/L (ref 15–41)
Albumin: 1.9 g/dL — ABNORMAL LOW (ref 3.5–5.0)
Alkaline Phosphatase: 90 U/L (ref 38–126)
Anion gap: 4 — ABNORMAL LOW (ref 5–15)
BUN: 10 mg/dL (ref 6–20)
CO2: 25 mmol/L (ref 22–32)
Calcium: 7.7 mg/dL — ABNORMAL LOW (ref 8.9–10.3)
Chloride: 110 mmol/L (ref 98–111)
Creatinine, Ser: 0.84 mg/dL (ref 0.44–1.00)
GFR calc Af Amer: >60 mL/min (ref >60)
GFR calc non Af Amer: >60 mL/min (ref >60)
Glucose, Bld: 83 mg/dL (ref 70–99)
Potassium: 3.9 mmol/L (ref 3.5–5.1)
Sodium: 139 mmol/L (ref 135–145)
Total Bilirubin: 0.5 mg/dL (ref 0.3–1.2)
Total Protein: 4 g/dL — ABNORMAL LOW (ref 6.5–8.1)

## 2019-03-02 LAB — PROTIME-INR
INR: 1.1 (ref 0.8–1.2)
Prothrombin Time: 13.9 seconds (ref 11.4–15.2)

## 2019-03-02 LAB — RHOGAM INJECTION: Unit division: 0

## 2019-03-02 LAB — APTT: aPTT: 31 seconds (ref 24–36)

## 2019-03-02 LAB — HEMOGLOBIN AND HEMATOCRIT, BLOOD
HCT: 20.6 % — ABNORMAL LOW (ref 36.0–46.0)
Hemoglobin: 6.9 g/dL — CL (ref 12.0–15.0)

## 2019-03-02 LAB — PLATELET COUNT: Platelets: 78 10*3/uL — ABNORMAL LOW (ref 150–400)

## 2019-03-02 LAB — FIBRINOGEN: Fibrinogen: 520 mg/dL — ABNORMAL HIGH (ref 210–475)

## 2019-03-02 MED ORDER — FERROUS GLUCONATE 324 (38 FE) MG PO TABS
324.0000 mg | ORAL_TABLET | Freq: Two times a day (BID) | ORAL | Status: DC
  Administered 2019-03-02 – 2019-03-03 (×2): 324 mg via ORAL
  Filled 2019-03-02 (×2): qty 1

## 2019-03-02 MED ORDER — OXYCODONE HCL 5 MG PO TABS
10.0000 mg | ORAL_TABLET | Freq: Four times a day (QID) | ORAL | Status: DC | PRN
  Administered 2019-03-02 – 2019-03-03 (×5): 10 mg via ORAL
  Filled 2019-03-02 (×5): qty 2

## 2019-03-02 NOTE — Lactation Note (Signed)
This note was copied from a baby's chart. Lactation Consultation Note  Patient Name: Kristy Hines Date: 03/02/2019 Reason for consult: Follow-up assessment;1st time breastfeeding;Primapara  P1 mother whose infant is now 60 hours old.  Mother had a [place] and has been set up with the DEBP for assist with increasing her milk supply.  Baby finished feeding 35 mls an hour ago.    Mother stated that she was supposed to pump at 1300 but had not had a chance to do this yet.  I suggested we start her pumping now and we can discuss breast feeding while she pumps.  Mother agreeable.  Reviewed pump parts and settings.  Flange size #24 was appropriate initially, however, mother's nipple started rubbing on the inside of the flange with phase 2 of pumping.  Changed her to a #27 for greater fit and comfort.  Mother is using coconut oil to lubricate inside of flanges.    Baby has been consuming large volumes of formula supplementation.  Provided feeding supplement guideline sheet for parents.  Encouraged frequent burping during feedings.    Encouraged mother to feed 8-12 times/24 hours or sooner if baby shows feeding cues.  Reviewed cues.  Mother will put baby to breast at every feeding prior to giving formula supplementation.  She will call her RN/LC for latch assistance as needed.  Encouraged hand expression before/after feeding and pumping to help increase milk supply.  Mother aware that milk coming to volume may be delayed due to [facility].  Suggested she stay on a consistent pumping routine.    Mother will be obtaining a DEBP from the [facility] office after discharge.  This has already been arranged.  Father present.  Provided our virtual breast feeding support group information since mother may be interested in participating in this.  She has our OP phone number for questions/concerns after discharge.   Maternal Data Formula Feeding for Exclusion: No Has patient been taught Hand Expression?:  Yes Does the patient have breastfeeding experience prior to this delivery?: No  Feeding Feeding Type: Bottle Fed - Formula Nipple Type: Slow - flow  LATCH Score                   Interventions    Lactation Tools Discussed/Used WIC Program: Yes Pump Review: Setup, frequency, and cleaning;Milk Storage(Reviewed)   Consult Status Consult Status: Follow-up Date: 03/03/19 Follow-up type: In-patient    Tatyanna Cronk R Bryleigh Ottaway 03/02/2019, 2:18 PM

## 2019-03-02 NOTE — Progress Notes (Signed)
CRITICAL VALUE ALERT  Critical Value:  hgb 6.9  Date & Time Notied:  8372  Provider Notified: Janyth Pupa, MD  Orders Received/Actions taken: none

## 2019-03-02 NOTE — Progress Notes (Signed)
Postpartum/Postop Day #2  S: Pt resting comfortably, sitting up in bed.  Noting some pelvic pressure and pain with movement and using the bathroom.  Does note improvement with medication.  No F/C/CP/SOB.  +flatus, no BM.  Lochia appropriate.  Tolerating gen diet.  No nausea or vomiting.  Some ambulation.  Foley in place this am  O: BP (!) 93/53 (BP Location: Left Arm)   Pulse 77   Temp (!) 97.5 F (36.4 C) (Oral)   Resp 16   Ht 5\' 6"  (1.676 m)   Wt 107.1 kg   LMP 05/26/2018   SpO2 99%   Breastfeeding Unknown   BMI 38.12 kg/m  UOP: 1300/8hr  Gen: alert & oriented CV: RRR Resp: CTAB Abd: obese, soft and non-tender, uterus below umbilicus, appropriately tender GU: Pad in place with moderate blood noted Ext: 1+ edema, negative Homan's bilaterally  Yesterday labs: CBC Latest Ref Rng & Units 03/01/2019 03/01/2019 02/28/2019  WBC 4.0 - 10.5 K/uL - 13.4(H) 12.9(H)  Hemoglobin 12.0 - 15.0 g/dL 7.0(L) 7.6(L) 7.2(L)  Hematocrit 36.0 - 46.0 % 20.0(L) 22.2(L) 21.1(L)  Platelets 150 - 400 K/uL 53(L) 61(L) 67(L)    A/P: 38yo s/p VAVD with cervical laceration- complicated by PPH, DIC, s/p uterine a. Embolization, POD#2  Heme: -labs pending for this am -lochia appropriate, no evidence of active bleeding  -VSS and UOP adequate -heplock IV  GI- tolerating regular diet -continue stool softner  GU: -continue to monitor peripads -Foley to be removed this am  Pain: -no NSAIDs -continue with tylenol and ultram as need with oxycodone for breakthrough -plan to work on controlling her pain with activity  Activity:  -encourage ambulation as tolerated  DISPO: pt to void on her own today and pending lab work, possible removal of epidural cathter today.  Continue postop management as outlined above  Janyth Pupa, DO [phone] (cell) [phone] (office)

## 2019-03-03 MED ORDER — ACETAMINOPHEN 325 MG PO TABS: 650.0000 mg | ORAL_TABLET | ORAL | 3 refills | Status: DC | PRN

## 2019-03-03 MED ORDER — OXYCODONE HCL 5 MG PO TABS: ORAL_TABLET | ORAL | 0 refills | Status: DC

## 2019-03-03 MED ORDER — TRAMADOL HCL 50 MG PO TABS: 50.0000 mg | ORAL_TABLET | Freq: Four times a day (QID) | ORAL | 0 refills | Status: AC | PRN

## 2019-03-03 MED ORDER — FERROUS GLUCONATE 324 (38 FE) MG PO TABS: 324.0000 mg | ORAL_TABLET | Freq: Two times a day (BID) | ORAL | 3 refills | Status: DC

## 2019-03-03 MED ORDER — DOCUSATE SODIUM 100 MG PO CAPS: 100.0000 mg | ORAL_CAPSULE | Freq: Two times a day (BID) | ORAL | 1 refills | Status: DC

## 2019-03-03 NOTE — Progress Notes (Signed)
Patient left without discharge teaching even after nurse asking patient to call out prior to leaving so that discharge papers that were placed on computer in patient room could be reviewed for mom and baby prior to their departure. Per patient this was a miscommunication with the patient and tech. FOB arrived to the hospital for papers and discharge teaching, follow up care discussion for both.

## 2019-03-03 NOTE — Anesthesia Postprocedure Evaluation (Signed)
Anesthesia Post Note  Patient: Kristy Hines  Procedure(s) Performed: SUTURE REPAIR PERINEAL LACERATION (N/A )     Patient location during evaluation: Women's Unit Anesthesia Type: Epidural Level of consciousness: awake and alert Pain management: pain level controlled Vital Signs Assessment: post-procedure vital signs reviewed and stable Respiratory status: spontaneous breathing, respiratory function stable and nonlabored ventilation Cardiovascular status: blood pressure returned to baseline Postop Assessment: epidural receding and no apparent nausea or vomiting Anesthetic complications: no Comments:  Delayed removal of epidural catheter due to coagulopathy. Now removed, patient doing well.    Last Vitals:  Vitals:   03/03/19 0445 03/03/19 0739  BP: 107/62 (!) 96/54  Pulse: 77 72  Resp: 16 18  Temp: 36.5 C 36.8 C  SpO2: 96% 96%    Last Pain:  Vitals:   03/03/19 0739  TempSrc: Oral  PainSc:                  Audry Pili

## 2019-03-03 NOTE — Progress Notes (Signed)
Referring Physician(s): Dr Katharine Look  Supervising Physician: Oley Balm  Patient Status:  [facility] - In-pt  Chief Complaint:  S/p bilateral gelfoam embo of the peripheral internal iliac arteries at the uterine branches  Subjective: Improved today, although still recovering.  Denies pain to groin sites.  Has been able to ambulate without difficulty.  Allergies: Patient has no known allergies.  Medications: Prior to Admission medications   Medication Sig Start Date End Date Taking? Authorizing Provider  Multiple Vitamin (MULTIVITAMIN) tablet Take 1 tablet by mouth daily.    [provider]  oxyCODONE-acetaminophen (PERCOCET) 5-325 MG tablet Take 1 tablet by mouth every 6 (six) hours as needed for severe pain. Patient not taking: Reported on 09/28/2018 04/22/18   Jerre Simon, PA  SPRINTEC 28 0.25-35 MG-MCG tablet Take 1 tablet by mouth daily. 01/28/18   [provider]     Vital Signs: BP (!) 112/59 (BP Location: Left Arm)    Pulse 83    Temp 97.8 F (36.6 C) (Oral)    Resp 16    Ht  (1.676 m)    Wt 236 lb 3.2 oz (107.1 kg)    LMP 05/26/2018    SpO2 94%    Breastfeeding Unknown    BMI 38.12 kg/m   Physical Exam Vitals signs reviewed.   Abdomen: soft, non-distended. Expected tenderness. Groin: bilateral groin puncture site c/d/i.  No evidence of hematoma.  Imaging: Ir Angiogram Pelvis Selective Or Supraselective  Result Date: 03/01/2019 INDICATION: Postpartum hemorrhage, vaginal wall and cervical lacerations, active bleeding requiring transfusion protocol EXAM: Ultrasound guidance for vascular access Bilateral internal iliac catheterizations, angiograms, and Gelfoam embolizations MEDICATIONS: 1% lidocaine local. ANESTHESIA/SEDATION: Moderate (conscious) sedation was employed during this procedure. A total of Versed 2.0 mg and Fentanyl 100 mcg was administered intravenously. Moderate Sedation Time: 65 minutes. The patient's level of consciousness and  vital signs were monitored continuously by radiology nursing throughout the procedure under my direct supervision. CONTRAST:  75 cc Omnipaque 300 FLUOROSCOPY TIME:  Fluoroscopy Time: 19 minutes 24 seconds (3,292 mGy). COMPLICATIONS: None immediate. PROCEDURE: Informed consent was obtained from the patient following explanation of the procedure, risks, benefits and alternatives. The patient understands, agrees and consents for the procedure. All questions were addressed. A time out was performed prior to the initiation of the procedure. Maximal barrier sterile technique utilized including caps, mask, sterile gowns, sterile gloves, large sterile drape, hand hygiene, and Betadine prep. Under sterile conditions and local anesthesia, ultrasound micropuncture access performed of the common femoral arteries bilaterally. Images obtained for documentation of the patent common femoral arteries. Five French sheaths inserted bilaterally. C2 catheters advanced into the internal iliac arteries bilaterally. Initially, the right internal iliac artery was evaluated. Right internal iliac angiograms performed. Right internal iliac: There is active contrast extravasation consistent with arterial bleeding along the right hemipelvis from small vaginal/cervical branches off of the anterior division of the right internal iliac artery where the inferior gluteal, obturator and pudendal branches are located. The dominant small bleeding vaginal/cervical branch appears to be off of the inferior gluteal artery. Right internal iliac Gelfoam embolization: Renegade high flow microcatheter advanced to the trifurcation of the inferior gluteal, obturator and pudendal branches. Repeat angiogram confirms active arterial bleeding in this region. Gelfoam embolization performed. Following embolization, repeat angiogram through the microcatheter at the embolization site demonstrates no further active arterial bleeding in this distribution. There is  diminished flow throughout the right inferior gluteal, obturator and pudendal branches. Attempts were made to  catheterize the very tortuous origin of the right uterine artery however this was unsuccessful. However, no further active bleeding demonstrated from the right internal iliac artery. Next, the left internal iliac artery was evaluated. Left internal iliac angiogram performed. Left internal iliac: The left internal iliac branches are all patent including the supragluteal, inferior gluteal, obturator, pudendal common uterine branches. Uterine vasculature is hypertrophied because of the immediate postpartum state. C2 catheter was advanced into the anterior division at the branching point of the inferior gluteal, obturator, pudendal, and uterine vasculature. Angiograms as this location demonstrated no active bleeding however because of the ongoing postpartum hemorrhage left internal iliac Gelfoam embolization was also performed. Left internal iliac Gelfoam embolization: At the branch point of the inferior gluteal, obturator, pudendal and uterine branches Gel-Foam slurry embolization was performed. Following embolization, there is diminished arterial flow throughout the left internal iliac anterior division distribution. Catheter removed. Hemostasis obtained at both puncture sites with ExoSeal device. No immediate complication. Patient tolerated the procedure well. She will be transferred to the ICU for overnight observation and continued transfusion. IMPRESSION: Positive exam for active arterial bleeding in the right hemipelvis from small vaginal/cervical branches of the right inferior gluteal, obturator, and pudendal vascular territory. Successful bilateral anterior internal iliac artery Gelfoam embolization as detailed above. Electronically Signed   By: Judie PetitM.  Shick M.D.   On: 03/01/2019 08:48   Ir Angiogram Pelvis Selective Or Supraselective  Result Date: 03/01/2019 INDICATION: Postpartum hemorrhage, vaginal  wall and cervical lacerations, active bleeding requiring transfusion protocol EXAM: Ultrasound guidance for vascular access Bilateral internal iliac catheterizations, angiograms, and Gelfoam embolizations MEDICATIONS: 1% lidocaine local. ANESTHESIA/SEDATION: Moderate (conscious) sedation was employed during this procedure. A total of Versed 2.0 mg and Fentanyl 100 mcg was administered intravenously. Moderate Sedation Time: 65 minutes. The patient's level of consciousness and vital signs were monitored continuously by radiology nursing throughout the procedure under my direct supervision. CONTRAST:  75 cc Omnipaque 300 FLUOROSCOPY TIME:  Fluoroscopy Time: 19 minutes 24 seconds (3,292 mGy). COMPLICATIONS: None immediate. PROCEDURE: Informed consent was obtained from the patient following explanation of the procedure, risks, benefits and alternatives. The patient understands, agrees and consents for the procedure. All questions were addressed. A time out was performed prior to the initiation of the procedure. Maximal barrier sterile technique utilized including caps, mask, sterile gowns, sterile gloves, large sterile drape, hand hygiene, and Betadine prep. Under sterile conditions and local anesthesia, ultrasound micropuncture access performed of the common femoral arteries bilaterally. Images obtained for documentation of the patent common femoral arteries. Five French sheaths inserted bilaterally. C2 catheters advanced into the internal iliac arteries bilaterally. Initially, the right internal iliac artery was evaluated. Right internal iliac angiograms performed. Right internal iliac: There is active contrast extravasation consistent with arterial bleeding along the right hemipelvis from small vaginal/cervical branches off of the anterior division of the right internal iliac artery where the inferior gluteal, obturator and pudendal branches are located. The dominant small bleeding vaginal/cervical branch appears to  be off of the inferior gluteal artery. Right internal iliac Gelfoam embolization: Renegade high flow microcatheter advanced to the trifurcation of the inferior gluteal, obturator and pudendal branches. Repeat angiogram confirms active arterial bleeding in this region. Gelfoam embolization performed. Following embolization, repeat angiogram through the microcatheter at the embolization site demonstrates no further active arterial bleeding in this distribution. There is diminished flow throughout the right inferior gluteal, obturator and pudendal branches. Attempts were made to catheterize the very tortuous origin of the right uterine artery however this  was unsuccessful. However, no further active bleeding demonstrated from the right internal iliac artery. Next, the left internal iliac artery was evaluated. Left internal iliac angiogram performed. Left internal iliac: The left internal iliac branches are all patent including the supragluteal, inferior gluteal, obturator, pudendal common uterine branches. Uterine vasculature is hypertrophied because of the immediate postpartum state. C2 catheter was advanced into the anterior division at the branching point of the inferior gluteal, obturator, pudendal, and uterine vasculature. Angiograms as this location demonstrated no active bleeding however because of the ongoing postpartum hemorrhage left internal iliac Gelfoam embolization was also performed. Left internal iliac Gelfoam embolization: At the branch point of the inferior gluteal, obturator, pudendal and uterine branches Gel-Foam slurry embolization was performed. Following embolization, there is diminished arterial flow throughout the left internal iliac anterior division distribution. Catheter removed. Hemostasis obtained at both puncture sites with ExoSeal device. No immediate complication. Patient tolerated the procedure well. She will be transferred to the ICU for overnight observation and continued  transfusion. IMPRESSION: Positive exam for active arterial bleeding in the right hemipelvis from small vaginal/cervical branches of the right inferior gluteal, obturator, and pudendal vascular territory. Successful bilateral anterior internal iliac artery Gelfoam embolization as detailed above. Electronically Signed   By: Judie Petit.  Shick M.D.   On: 03/01/2019 08:48   Ir Angiogram Selective Each Additional Vessel  Result Date: 03/01/2019 INDICATION: Postpartum hemorrhage, vaginal wall and cervical lacerations, active bleeding requiring transfusion protocol EXAM: Ultrasound guidance for vascular access Bilateral internal iliac catheterizations, angiograms, and Gelfoam embolizations MEDICATIONS: 1% lidocaine local. ANESTHESIA/SEDATION: Moderate (conscious) sedation was employed during this procedure. A total of Versed 2.0 mg and Fentanyl 100 mcg was administered intravenously. Moderate Sedation Time: 65 minutes. The patient's level of consciousness and vital signs were monitored continuously by radiology nursing throughout the procedure under my direct supervision. CONTRAST:  75 cc Omnipaque 300 FLUOROSCOPY TIME:  Fluoroscopy Time: 19 minutes 24 seconds (3,292 mGy). COMPLICATIONS: None immediate. PROCEDURE: Informed consent was obtained from the patient following explanation of the procedure, risks, benefits and alternatives. The patient understands, agrees and consents for the procedure. All questions were addressed. A time out was performed prior to the initiation of the procedure. Maximal barrier sterile technique utilized including caps, mask, sterile gowns, sterile gloves, large sterile drape, hand hygiene, and Betadine prep. Under sterile conditions and local anesthesia, ultrasound micropuncture access performed of the common femoral arteries bilaterally. Images obtained for documentation of the patent common femoral arteries. Five French sheaths inserted bilaterally. C2 catheters advanced into the internal iliac  arteries bilaterally. Initially, the right internal iliac artery was evaluated. Right internal iliac angiograms performed. Right internal iliac: There is active contrast extravasation consistent with arterial bleeding along the right hemipelvis from small vaginal/cervical branches off of the anterior division of the right internal iliac artery where the inferior gluteal, obturator and pudendal branches are located. The dominant small bleeding vaginal/cervical branch appears to be off of the inferior gluteal artery. Right internal iliac Gelfoam embolization: Renegade high flow microcatheter advanced to the trifurcation of the inferior gluteal, obturator and pudendal branches. Repeat angiogram confirms active arterial bleeding in this region. Gelfoam embolization performed. Following embolization, repeat angiogram through the microcatheter at the embolization site demonstrates no further active arterial bleeding in this distribution. There is diminished flow throughout the right inferior gluteal, obturator and pudendal branches. Attempts were made to catheterize the very tortuous origin of the right uterine artery however this was unsuccessful. However, no further active bleeding demonstrated from the right internal  iliac artery. Next, the left internal iliac artery was evaluated. Left internal iliac angiogram performed. Left internal iliac: The left internal iliac branches are all patent including the supragluteal, inferior gluteal, obturator, pudendal common uterine branches. Uterine vasculature is hypertrophied because of the immediate postpartum state. C2 catheter was advanced into the anterior division at the branching point of the inferior gluteal, obturator, pudendal, and uterine vasculature. Angiograms as this location demonstrated no active bleeding however because of the ongoing postpartum hemorrhage left internal iliac Gelfoam embolization was also performed. Left internal iliac Gelfoam embolization: At the  branch point of the inferior gluteal, obturator, pudendal and uterine branches Gel-Foam slurry embolization was performed. Following embolization, there is diminished arterial flow throughout the left internal iliac anterior division distribution. Catheter removed. Hemostasis obtained at both puncture sites with ExoSeal device. No immediate complication. Patient tolerated the procedure well. She will be transferred to the ICU for overnight observation and continued transfusion. IMPRESSION: Positive exam for active arterial bleeding in the right hemipelvis from small vaginal/cervical branches of the right inferior gluteal, obturator, and pudendal vascular territory. Successful bilateral anterior internal iliac artery Gelfoam embolization as detailed above. Electronically Signed   By: Judie PetitM.  Shick M.D.   On: 03/01/2019 08:48   Ir Angiogram Selective Each Additional Vessel  Result Date: 03/01/2019 INDICATION: Postpartum hemorrhage, vaginal wall and cervical lacerations, active bleeding requiring transfusion protocol EXAM: Ultrasound guidance for vascular access Bilateral internal iliac catheterizations, angiograms, and Gelfoam embolizations MEDICATIONS: 1% lidocaine local. ANESTHESIA/SEDATION: Moderate (conscious) sedation was employed during this procedure. A total of Versed 2.0 mg and Fentanyl 100 mcg was administered intravenously. Moderate Sedation Time: 65 minutes. The patient's level of consciousness and vital signs were monitored continuously by radiology nursing throughout the procedure under my direct supervision. CONTRAST:  75 cc Omnipaque 300 FLUOROSCOPY TIME:  Fluoroscopy Time: 19 minutes 24 seconds (3,292 mGy). COMPLICATIONS: None immediate. PROCEDURE: Informed consent was obtained from the patient following explanation of the procedure, risks, benefits and alternatives. The patient understands, agrees and consents for the procedure. All questions were addressed. A time out was performed prior to the  initiation of the procedure. Maximal barrier sterile technique utilized including caps, mask, sterile gowns, sterile gloves, large sterile drape, hand hygiene, and Betadine prep. Under sterile conditions and local anesthesia, ultrasound micropuncture access performed of the common femoral arteries bilaterally. Images obtained for documentation of the patent common femoral arteries. Five French sheaths inserted bilaterally. C2 catheters advanced into the internal iliac arteries bilaterally. Initially, the right internal iliac artery was evaluated. Right internal iliac angiograms performed. Right internal iliac: There is active contrast extravasation consistent with arterial bleeding along the right hemipelvis from small vaginal/cervical branches off of the anterior division of the right internal iliac artery where the inferior gluteal, obturator and pudendal branches are located. The dominant small bleeding vaginal/cervical branch appears to be off of the inferior gluteal artery. Right internal iliac Gelfoam embolization: Renegade high flow microcatheter advanced to the trifurcation of the inferior gluteal, obturator and pudendal branches. Repeat angiogram confirms active arterial bleeding in this region. Gelfoam embolization performed. Following embolization, repeat angiogram through the microcatheter at the embolization site demonstrates no further active arterial bleeding in this distribution. There is diminished flow throughout the right inferior gluteal, obturator and pudendal branches. Attempts were made to catheterize the very tortuous origin of the right uterine artery however this was unsuccessful. However, no further active bleeding demonstrated from the right internal iliac artery. Next, the left internal iliac artery was evaluated. Left internal  iliac angiogram performed. Left internal iliac: The left internal iliac branches are all patent including the supragluteal, inferior gluteal, obturator, pudendal  common uterine branches. Uterine vasculature is hypertrophied because of the immediate postpartum state. C2 catheter was advanced into the anterior division at the branching point of the inferior gluteal, obturator, pudendal, and uterine vasculature. Angiograms as this location demonstrated no active bleeding however because of the ongoing postpartum hemorrhage left internal iliac Gelfoam embolization was also performed. Left internal iliac Gelfoam embolization: At the branch point of the inferior gluteal, obturator, pudendal and uterine branches Gel-Foam slurry embolization was performed. Following embolization, there is diminished arterial flow throughout the left internal iliac anterior division distribution. Catheter removed. Hemostasis obtained at both puncture sites with ExoSeal device. No immediate complication. Patient tolerated the procedure well. She will be transferred to the ICU for overnight observation and continued transfusion. IMPRESSION: Positive exam for active arterial bleeding in the right hemipelvis from small vaginal/cervical branches of the right inferior gluteal, obturator, and pudendal vascular territory. Successful bilateral anterior internal iliac artery Gelfoam embolization as detailed above. Electronically Signed   By: Judie Petit.  Shick M.D.   On: 03/01/2019 08:48   Ir US Guide Vasc Access Left  Result Date: 03/01/2019 INDICATION: Postpartum hemorrhage, vaginal wall and cervical lacerations, active bleeding requiring transfusion protocol EXAM: Ultrasound guidance for vascular access Bilateral internal iliac catheterizations, angiograms, and Gelfoam embolizations MEDICATIONS: 1% lidocaine local. ANESTHESIA/SEDATION: Moderate (conscious) sedation was employed during this procedure. A total of Versed 2.0 mg and Fentanyl 100 mcg was administered intravenously. Moderate Sedation Time: 65 minutes. The patient's level of consciousness and vital signs were monitored continuously by radiology nursing  throughout the procedure under my direct supervision. CONTRAST:  75 cc Omnipaque 300 FLUOROSCOPY TIME:  Fluoroscopy Time: 19 minutes 24 seconds (3,292 mGy). COMPLICATIONS: None immediate. PROCEDURE: Informed consent was obtained from the patient following explanation of the procedure, risks, benefits and alternatives. The patient understands, agrees and consents for the procedure. All questions were addressed. A time out was performed prior to the initiation of the procedure. Maximal barrier sterile technique utilized including caps, mask, sterile gowns, sterile gloves, large sterile drape, hand hygiene, and Betadine prep. Under sterile conditions and local anesthesia, ultrasound micropuncture access performed of the common femoral arteries bilaterally. Images obtained for documentation of the patent common femoral arteries. Five French sheaths inserted bilaterally. C2 catheters advanced into the internal iliac arteries bilaterally. Initially, the right internal iliac artery was evaluated. Right internal iliac angiograms performed. Right internal iliac: There is active contrast extravasation consistent with arterial bleeding along the right hemipelvis from small vaginal/cervical branches off of the anterior division of the right internal iliac artery where the inferior gluteal, obturator and pudendal branches are located. The dominant small bleeding vaginal/cervical branch appears to be off of the inferior gluteal artery. Right internal iliac Gelfoam embolization: Renegade high flow microcatheter advanced to the trifurcation of the inferior gluteal, obturator and pudendal branches. Repeat angiogram confirms active arterial bleeding in this region. Gelfoam embolization performed. Following embolization, repeat angiogram through the microcatheter at the embolization site demonstrates no further active arterial bleeding in this distribution. There is diminished flow throughout the right inferior gluteal, obturator and  pudendal branches. Attempts were made to catheterize the very tortuous origin of the right uterine artery however this was unsuccessful. However, no further active bleeding demonstrated from the right internal iliac artery. Next, the left internal iliac artery was evaluated. Left internal iliac angiogram performed. Left internal iliac: The left internal iliac branches  are all patent including the supragluteal, inferior gluteal, obturator, pudendal common uterine branches. Uterine vasculature is hypertrophied because of the immediate postpartum state. C2 catheter was advanced into the anterior division at the branching point of the inferior gluteal, obturator, pudendal, and uterine vasculature. Angiograms as this location demonstrated no active bleeding however because of the ongoing postpartum hemorrhage left internal iliac Gelfoam embolization was also performed. Left internal iliac Gelfoam embolization: At the branch point of the inferior gluteal, obturator, pudendal and uterine branches Gel-Foam slurry embolization was performed. Following embolization, there is diminished arterial flow throughout the left internal iliac anterior division distribution. Catheter removed. Hemostasis obtained at both puncture sites with ExoSeal device. No immediate complication. Patient tolerated the procedure well. She will be transferred to the ICU for overnight observation and continued transfusion. IMPRESSION: Positive exam for active arterial bleeding in the right hemipelvis from small vaginal/cervical branches of the right inferior gluteal, obturator, and pudendal vascular territory. Successful bilateral anterior internal iliac artery Gelfoam embolization as detailed above. Electronically Signed   By: Judie Petit.  Shick M.D.   On: 03/01/2019 08:48   Ir US Guide Vasc Access Right  Result Date: 03/01/2019 INDICATION: Postpartum hemorrhage, vaginal wall and cervical lacerations, active bleeding requiring transfusion protocol EXAM:  Ultrasound guidance for vascular access Bilateral internal iliac catheterizations, angiograms, and Gelfoam embolizations MEDICATIONS: 1% lidocaine local. ANESTHESIA/SEDATION: Moderate (conscious) sedation was employed during this procedure. A total of Versed 2.0 mg and Fentanyl 100 mcg was administered intravenously. Moderate Sedation Time: 65 minutes. The patient's level of consciousness and vital signs were monitored continuously by radiology nursing throughout the procedure under my direct supervision. CONTRAST:  75 cc Omnipaque 300 FLUOROSCOPY TIME:  Fluoroscopy Time: 19 minutes 24 seconds (3,292 mGy). COMPLICATIONS: None immediate. PROCEDURE: Informed consent was obtained from the patient following explanation of the procedure, risks, benefits and alternatives. The patient understands, agrees and consents for the procedure. All questions were addressed. A time out was performed prior to the initiation of the procedure. Maximal barrier sterile technique utilized including caps, mask, sterile gowns, sterile gloves, large sterile drape, hand hygiene, and Betadine prep. Under sterile conditions and local anesthesia, ultrasound micropuncture access performed of the common femoral arteries bilaterally. Images obtained for documentation of the patent common femoral arteries. Five French sheaths inserted bilaterally. C2 catheters advanced into the internal iliac arteries bilaterally. Initially, the right internal iliac artery was evaluated. Right internal iliac angiograms performed. Right internal iliac: There is active contrast extravasation consistent with arterial bleeding along the right hemipelvis from small vaginal/cervical branches off of the anterior division of the right internal iliac artery where the inferior gluteal, obturator and pudendal branches are located. The dominant small bleeding vaginal/cervical branch appears to be off of the inferior gluteal artery. Right internal iliac Gelfoam embolization:  Renegade high flow microcatheter advanced to the trifurcation of the inferior gluteal, obturator and pudendal branches. Repeat angiogram confirms active arterial bleeding in this region. Gelfoam embolization performed. Following embolization, repeat angiogram through the microcatheter at the embolization site demonstrates no further active arterial bleeding in this distribution. There is diminished flow throughout the right inferior gluteal, obturator and pudendal branches. Attempts were made to catheterize the very tortuous origin of the right uterine artery however this was unsuccessful. However, no further active bleeding demonstrated from the right internal iliac artery. Next, the left internal iliac artery was evaluated. Left internal iliac angiogram performed. Left internal iliac: The left internal iliac branches are all patent including the supragluteal, inferior gluteal, obturator, pudendal common uterine  branches. Uterine vasculature is hypertrophied because of the immediate postpartum state. C2 catheter was advanced into the anterior division at the branching point of the inferior gluteal, obturator, pudendal, and uterine vasculature. Angiograms as this location demonstrated no active bleeding however because of the ongoing postpartum hemorrhage left internal iliac Gelfoam embolization was also performed. Left internal iliac Gelfoam embolization: At the branch point of the inferior gluteal, obturator, pudendal and uterine branches Gel-Foam slurry embolization was performed. Following embolization, there is diminished arterial flow throughout the left internal iliac anterior division distribution. Catheter removed. Hemostasis obtained at both puncture sites with ExoSeal device. No immediate complication. Patient tolerated the procedure well. She will be transferred to the ICU for overnight observation and continued transfusion. IMPRESSION: Positive exam for active arterial bleeding in the right hemipelvis  from small vaginal/cervical branches of the right inferior gluteal, obturator, and pudendal vascular territory. Successful bilateral anterior internal iliac artery Gelfoam embolization as detailed above. Electronically Signed   By: Judie PetitM.  Shick M.D.   On: 03/01/2019 08:48   Ir Embo [facility] SoldersArt  Ven Hemorr Lymph Express ScriptsExtrav  Inc Guide Roadmapping  Result Date: 03/01/2019 INDICATION: Postpartum hemorrhage, vaginal wall and cervical lacerations, active bleeding requiring transfusion protocol EXAM: Ultrasound guidance for vascular access Bilateral internal iliac catheterizations, angiograms, and Gelfoam embolizations MEDICATIONS: 1% lidocaine local. ANESTHESIA/SEDATION: Moderate (conscious) sedation was employed during this procedure. A total of Versed 2.0 mg and Fentanyl 100 mcg was administered intravenously. Moderate Sedation Time: 65 minutes. The patient's level of consciousness and vital signs were monitored continuously by radiology nursing throughout the procedure under my direct supervision. CONTRAST:  75 cc Omnipaque 300 FLUOROSCOPY TIME:  Fluoroscopy Time: 19 minutes 24 seconds (3,292 mGy). COMPLICATIONS: None immediate. PROCEDURE: Informed consent was obtained from the patient following explanation of the procedure, risks, benefits and alternatives. The patient understands, agrees and consents for the procedure. All questions were addressed. A time out was performed prior to the initiation of the procedure. Maximal barrier sterile technique utilized including caps, mask, sterile gowns, sterile gloves, large sterile drape, hand hygiene, and Betadine prep. Under sterile conditions and local anesthesia, ultrasound micropuncture access performed of the common femoral arteries bilaterally. Images obtained for documentation of the patent common femoral arteries. Five French sheaths inserted bilaterally. C2 catheters advanced into the internal iliac arteries bilaterally. Initially, the right internal iliac artery was evaluated.  Right internal iliac angiograms performed. Right internal iliac: There is active contrast extravasation consistent with arterial bleeding along the right hemipelvis from small vaginal/cervical branches off of the anterior division of the right internal iliac artery where the inferior gluteal, obturator and pudendal branches are located. The dominant small bleeding vaginal/cervical branch appears to be off of the inferior gluteal artery. Right internal iliac Gelfoam embolization: Renegade high flow microcatheter advanced to the trifurcation of the inferior gluteal, obturator and pudendal branches. Repeat angiogram confirms active arterial bleeding in this region. Gelfoam embolization performed. Following embolization, repeat angiogram through the microcatheter at the embolization site demonstrates no further active arterial bleeding in this distribution. There is diminished flow throughout the right inferior gluteal, obturator and pudendal branches. Attempts were made to catheterize the very tortuous origin of the right uterine artery however this was unsuccessful. However, no further active bleeding demonstrated from the right internal iliac artery. Next, the left internal iliac artery was evaluated. Left internal iliac angiogram performed. Left internal iliac: The left internal iliac branches are all patent including the supragluteal, inferior gluteal, obturator, pudendal common uterine branches. Uterine vasculature is hypertrophied because  of the immediate postpartum state. C2 catheter was advanced into the anterior division at the branching point of the inferior gluteal, obturator, pudendal, and uterine vasculature. Angiograms as this location demonstrated no active bleeding however because of the ongoing postpartum hemorrhage left internal iliac Gelfoam embolization was also performed. Left internal iliac Gelfoam embolization: At the branch point of the inferior gluteal, obturator, pudendal and uterine branches  Gel-Foam slurry embolization was performed. Following embolization, there is diminished arterial flow throughout the left internal iliac anterior division distribution. Catheter removed. Hemostasis obtained at both puncture sites with ExoSeal device. No immediate complication. Patient tolerated the procedure well. She will be transferred to the ICU for overnight observation and continued transfusion. IMPRESSION: Positive exam for active arterial bleeding in the right hemipelvis from small vaginal/cervical branches of the right inferior gluteal, obturator, and pudendal vascular territory. Successful bilateral anterior internal iliac artery Gelfoam embolization as detailed above. Electronically Signed   By: Jerilynn Mages.  Shick M.D.   On: 03/01/2019 08:48    Labs:  CBC: Recent Labs    02/28/19 0020  02/28/19 1904 02/28/19 2314 03/01/19 0348 03/01/19 1439 03/02/19 0658  WBC 8.1  --  16.3* 12.9* 13.4*  --   --   HGB 12.1   < > 7.0* 7.2* 7.6* 7.0* 6.9*  HCT 37.0   < > 21.5* 21.1* 22.2* 20.0* 20.6*  PLT 224  --  118*   117* 67* 61* 53* 78*   < > = values in this interval not displayed.    COAGS: Recent Labs    02/28/19 2314 03/01/19 0348 03/01/19 1439 03/02/19 0658  INR 1.4* 1.4* 1.3* 1.1  APTT 42* 43* 34 31    BMP: Recent Labs    04/20/18 0429 04/22/18 1004 02/28/19 1831 03/01/19 0348 03/02/19 0658  NA 138 140 135 138 139  K 3.6 3.9 4.5 4.7 3.9  CL 107 107  --  112* 110  CO2 26 29  --  18* 25  GLUCOSE 86 140*  --  109* 83  BUN 6 6  --  16 10  CALCIUM 8.3* 8.6*  --  7.2* 7.7*  CREATININE 0.71 0.85  --  1.11* 0.84  GFRNONAA >60 >60  --  >60 >60  GFRAA >60 >60  --  >60 >60    LIVER FUNCTION TESTS: Recent Labs    04/20/18 0429 04/22/18 1004 03/01/19 0348 03/02/19 0658  BILITOT 1.3* 0.5 1.5* 0.5  AST 85* 28 32 20  ALT 70* 41 11 10  ALKPHOS 136* 132* 93 90  PROT 5.1* 5.3* 4.1* 4.0*  ALBUMIN 2.7* 2.7* 2.3* 1.9*    Assessment and Plan:  Uterine artery embolization 6/29,  bilateral by Dr. Annamaria Boots Patient improved today.  Progressing with recovery.  Groin sites intact.  No evidence of hematoma or pseudoaneurysm. HgB 7/1 6.9 Expected lochia, no evidence of ongoing active bleeding.  IR remains available if needed.  Electronically Signed: Docia Barrier, PA 03/03/2019, 12:02 AM   I spent a total of 15 Minutes at the the patient's bedside AND on the patient's hospital floor or unit, greater than 50% of which was counseling/coordinating care for post partum hemorrhage

## 2019-03-03 NOTE — Progress Notes (Signed)
Postpartum/Postop Day #3  S: Pt resting comfortably, sitting up in bed.  Reports that her pain is much better today. No F/C/CP/SOB.  +flatus, no BM.  Lochia appropriate.  Tolerating gen diet.  No nausea or vomiting.  Voiding and ambulating without difficulty.  No acute complaints and overall feeling better  O: BP (!) 96/54 (BP Location: Left Arm)   Pulse 72   Temp 98.2 F (36.8 C) (Oral)   Resp 18   Ht 5\' 6"  (1.676 m)   Wt 107.1 kg   LMP 05/26/2018   SpO2 96%   Breastfeeding Unknown   BMI 38.12 kg/m   Gen: alert & oriented CV: RRR Resp: CTAB Abd: obese, soft and non-tender, uterus below umbilicus, non-tender Ext: 1+ edema, negative Homan's bilaterally  CBC Latest Ref Rng & Units 03/02/2019 03/01/2019 03/01/2019  WBC 4.0 - 10.5 K/uL - - 13.4(H)  Hemoglobin 12.0 - 15.0 g/dL 6.9(LL) 7.0(L) 7.6(L)  Hematocrit 36.0 - 46.0 % 20.6(L) 20.0(L) 22.2(L)  Platelets 150 - 400 K/uL 78(L) 53(L) 61(L)    A/P: 38yo s/p VAVD with cervical laceration- complicated by PPH, DIC, s/p uterine a. Embolization, POD#3  Heme: -labs stable as above -lochia appropriate, no evidence of active bleeding  -VSS and UOP adequate -heplock IV  GI- tolerating regular diet -continue stool softner  GU: -voiding freely -bleeding remains appropriate  Pain: -no NSAIDs -better controlled with current regimen  Activity:  -encourage ambulation as tolerated  DISPO: Meeting postop/postpartum milestones appropriately.  Plan for discharge home today with close outpatient follow up  Janyth Pupa, DO [phone] (cell) [phone] (office)

## 2019-03-03 NOTE — Discharge Instructions (Addendum)
Breastfeeding and Self-Care It is normal to have some problems when you start to breastfeed your new baby. But there are things that you can do to take care of yourself and help prevent many common problems. This includes keeping your breasts healthy and making sure that your baby's mouth attaches (latches) properly to your nipple for feedings. Work with your doctor or breastfeeding specialist (Advertising copywriterlactation consultant) to find what works best for you. Follow these instructions at home: Breastfeeding strategy   Always make sure that your baby latches properly to breastfeed.  Make sure that your baby is in a proper position. Try different breastfeeding positions to find one that works best for you and your baby.  Breastfeed when you feel like you need to make your breasts less full or when your baby shows signs of hunger. This is called "breastfeeding on demand."  Do not delay feedings.  Try to relax when it is time to feed your baby. This helps your body release milk from your breast.  To help increase milk flow: ? Remove a small amount of milk from your breast right before breastfeeding. Do this using a pump or by squeezing with your hand. ? Apply warm, moist heat to your breast right before feeding. You can do this in the shower or with hand towels soaked with warm water. ? Massage your breast right before or during feeding. Breast care   To help your breasts stay healthy and keep them from getting too dry: ? Avoid using soap on your nipples. ? Let your nipples air-dry for 3-4 minutes after each feeding. ? Use only cotton bra pads to soak up breast milk that leaks. Be sure to change the pads if they become soaked with milk. If you use bra pads that can be thrown away, change them often. ? Put some lanolin on your nipples after breastfeeding. Pure lanolin does not need to be washed off your nipple before you feed your baby again. Pure lanolin is not harmful to your baby. ? Rub some  breast milk into your nipples: ? Use your hand to squeeze out a few drops of breast milk. ? Gently massage the milk into your nipples. ? Let your nipples air-dry.  Wear a supportive nursing bra. Avoid wearing: ? Tight clothing. ? Underwire bras or bras that put pressure on your breasts.  Use ice to help relieve pain or swelling of your breasts: ? Put ice in a plastic bag. ? Place a towel between your skin and the bag. ? Leave the ice on for 20 minutes, 2-3 times a day. General instructions  Drink enough fluid to keep your pee (urine) pale yellow.  Get plenty of rest. Sleep when your baby sleeps.  Talk to your doctor or breastfeeding specialist before taking any herbal supplements. Contact a health care provider if:  You have nipple pain.  You have cracking or soreness in your nipples that lasts longer than 1 week.  Your breasts are overfilled with milk (engorgement) and this lasts longer than 48 hours.  You have a fever.  You have pus-like fluid coming from your nipple.  You have redness, a rash, swelling, itching, or burning on your breast.  Your baby does not gain weight.  Your baby loses weight. Summary  There are things that you can do to take care of yourself and help prevent many common breastfeeding problems.  Always make sure that your baby's mouth attaches (latches) to your nipple properly to breastfeed.  Keep  your nipples from getting too dry, drink plenty of fluid, and get plenty of rest.  Feed on demand. Do not delay feedings. This information is not intended to replace advice given to you by your health care provider. Make sure you discuss any questions you have with your health care provider. Document Released: 03/25/2017 Document Revised: 12/08/2018 Document Reviewed: 03/25/2017 Elsevier Patient Education  2020 [employer].    SIDS Prevention Information Sudden infant death syndrome (SIDS) is the sudden, unexplained death of a healthy baby. The  cause of SIDS is not known, but certain things may increase the risk for SIDS. There are steps that you can take to help prevent SIDS. What steps can I take? Sleeping   Always place your baby on his or her back for naptime and bedtime. Do this until your baby is 89 year old. This sleeping position has the lowest risk of SIDS. Do not place your baby to sleep on his or her side or stomach unless your doctor tells you to do so.  Place your baby to sleep in a crib or bassinet that is close to a parent or caregiver's bed. This is the safest place for a baby to sleep.  Use a crib and crib mattress that have been safety-approved by the Freight forwarder and the [employer] for Diplomatic Services operational officer. ? Use a firm crib mattress with a fitted sheet. ? Do not put any of the following in the crib: ? Loose bedding. ? Quilts. ? Duvets. ? Sheepskins. ? Crib rail bumpers. ? Pillows. ? Toys. ? Stuffed animals. ? Avoid putting your your baby to sleep in an infant carrier, car seat, or swing.  Do not let your child sleep in the same bed as other people (co-sleeping). This increases the risk of suffocation. If you sleep with your baby, you may not wake up if your baby needs help or is hurt in any way. This is especially true if: ? You have been drinking or using drugs. ? You have been taking medicine for sleep. ? You have been taking medicine that may make you sleep. ? You are very tired.  Do not place more than one baby to sleep in a crib or bassinet. If you have more than one baby, they should each have their own sleeping area.  Do not place your baby to sleep on adult beds, soft mattresses, sofas, cushions, or waterbeds.  Do not let your baby get too hot while sleeping. Dress your baby in light clothing, such as a one-piece sleeper. Your baby should not feel hot to the touch and should not be sweaty. Swaddling your baby for sleep is not generally recommended.  Do not cover your  babys head with blankets while sleeping. Feeding  Breastfeed your baby. Babies who breastfeed wake up more easily and have less of a risk of breathing problems during sleep.  If you bring your baby into bed for a feeding, make sure you put him or her back into the crib after feeding. General instructions   Think about using a pacifier. A pacifier may help lower the risk of SIDS. Talk to your doctor about the best way to start using a pacifier with your baby. If you use a pacifier: ? It should be dry. ? Clean it regularly. ? Do not attach it to any strings or objects if your baby uses it while sleeping. ? Do not put the pacifier back into your baby's mouth if it falls out  while he or she is asleep.  Do not smoke or use tobacco around your baby. This is especially important when he or she is sleeping. If you smoke or use tobacco when you are not around your baby or when outside of your home, change your clothes and bathe before being around your baby.  Give your baby plenty of time on his or her tummy while he or she is awake and while you can watch. This helps: ? Your baby's muscles. ? Your baby's nervous system. ? To prevent the back of your baby's head from becoming flat.  Keep your baby up-to-date with all of his or her shots (vaccines). Where to find more information  American Academy of Family Physicians: www.https://powers.com/aafp.org  American Academy of Pediatrics: BridgeDigest.com.cywww.aap.org  [employer] of Health, [employer] Shriver National Institute of Child Health and Merchandiser, retailHuman Development, Safe to Sleep Campaign: https://www.davis.org/www.nichd.nih.gov/sts/ Summary  Sudden infant death syndrome (SIDS) is the sudden, unexplained death of a healthy baby.  The cause of SIDS is not known, but there are steps that you can take to help prevent SIDS.  Always place your baby on his or her back for naptime and bedtime until your baby is 40 year old.  Have your baby sleep in an approved crib or bassinet that is close to a parent  or caregiver's bed.  Make sure all soft objects, toys, blankets, pillows, loose bedding, sheepskins, and crib bumpers are kept out of your baby's sleep area. This information is not intended to replace advice given to you by your health care provider. Make sure you discuss any questions you have with your health care provider. Document Released: 02/04/2008 Document Revised: 08/21/2017 Document Reviewed: 09/23/2016 Elsevier Patient Education  2020 Elsevier Inc.   Postpartum Care After Vaginal Delivery This sheet gives you information about how to care for yourself from the time you deliver your baby to up to 6-12 weeks after delivery (postpartum period). Your health care provider may also give you more specific instructions. If you have problems or questions, contact your health care provider. Follow these instructions at home: Vaginal bleeding  It is normal to have vaginal bleeding (lochia) after delivery. Wear a sanitary pad for vaginal bleeding and discharge. ? During the first week after delivery, the amount and appearance of lochia is often similar to a menstrual period. ? Over the next few weeks, it will gradually decrease to a dry, yellow-brown discharge. ? For most women, lochia stops completely by 4-6 weeks after delivery. Vaginal bleeding can vary from woman to woman.  Change your sanitary pads frequently. Watch for any changes in your flow, such as: ? A sudden increase in volume. ? A change in color. ? Large blood clots.  If you pass a blood clot from your vagina, save it and call your health care provider to discuss. Do not flush blood clots down the toilet before talking with your health care provider.  Do not use tampons or douches until your health care provider says this is safe.  If you are not breastfeeding, your period should return 6-8 weeks after delivery. If you are feeding your child breast milk only (exclusive breastfeeding), your period may not return until you  stop breastfeeding. Perineal care  Keep the area between the vagina and the anus (perineum) clean and dry as told by your health care provider. Use medicated pads and pain-relieving sprays and creams as directed.  If you had a cut in the perineum (episiotomy) or a tear in the  vagina, check the area for signs of infection until you are healed. Check for: ? More redness, swelling, or pain. ? Fluid or blood coming from the cut or tear. ? Warmth. ? Pus or a bad smell.  You may be given a squirt bottle to use instead of wiping to clean the perineum area after you go to the bathroom. As you start healing, you may use the squirt bottle before wiping yourself. Make sure to wipe gently.  To relieve pain caused by an episiotomy, a tear in the vagina, or swollen veins in the anus (hemorrhoids), try taking a warm sitz bath 2-3 times a day. A sitz bath is a warm water bath that is taken while you are sitting down. The water should only come up to your hips and should cover your buttocks. Breast care  Within the first few days after delivery, your breasts may feel heavy, full, and uncomfortable (breast engorgement). Milk may also leak from your breasts. Your health care provider can suggest ways to help relieve the discomfort. Breast engorgement should go away within a few days.  If you are breastfeeding: ? Wear a bra that supports your breasts and fits you well. ? Keep your nipples clean and dry. Apply creams and ointments as told by your health care provider. ? You may need to use breast pads to absorb milk that leaks from your breasts. ? You may have uterine contractions every time you breastfeed for up to several weeks after delivery. Uterine contractions help your uterus return to its normal size. ? If you have any problems with breastfeeding, work with your health care provider or Advertising copywriterlactation consultant.  If you are not breastfeeding: ? Avoid touching your breasts a lot. Doing this can make your  breasts produce more milk. ? Wear a good-fitting bra and use cold packs to help with swelling. ? Do not squeeze out (express) milk. This causes you to make more milk. Intimacy and sexuality  Ask your health care provider when you can engage in sexual activity. This may depend on: ? Your risk of infection. ? How fast you are healing. ? Your comfort and desire to engage in sexual activity.  You are able to get pregnant after delivery, even if you have not had your period. If desired, talk with your health care provider about methods of birth control (contraception). Medicines  Take over-the-counter and prescription medicines only as told by your health care provider.  If you were prescribed an antibiotic medicine, take it as told by your health care provider. Do not stop taking the antibiotic even if you start to feel better. Activity  Gradually return to your normal activities as told by your health care provider. Ask your health care provider what activities are safe for you.  Rest as much as possible. Try to rest or take a nap while your baby is sleeping. Eating and drinking   Drink enough fluid to keep your urine pale yellow.  Eat high-fiber foods every day. These may help prevent or relieve constipation. High-fiber foods include: ? Whole grain cereals and breads. ? Brown rice. ? Beans. ? Fresh fruits and vegetables.  Do not try to lose weight quickly by cutting back on calories.  Take your prenatal vitamins until your postpartum checkup or until your health care provider tells you it is okay to stop. Lifestyle  Do not use any products that contain nicotine or tobacco, such as cigarettes and e-cigarettes. If you need help quitting, ask your health  care provider.  Do not drink alcohol, especially if you are breastfeeding. General instructions  Keep all follow-up visits for you and your baby as told by your health care provider. Most women visit their health care provider  for a postpartum checkup within the first 3-6 weeks after delivery. Contact a health care provider if:  You feel unable to cope with the changes that your child brings to your life, and these feelings do not go away.  You feel unusually sad or worried.  Your breasts become red, painful, or hard.  You have a fever.  You have trouble holding urine or keeping urine from leaking.  You have little or no interest in activities you used to enjoy.  You have not breastfed at all and you have not had a menstrual period for 12 weeks after delivery.  You have stopped breastfeeding and you have not had a menstrual period for 12 weeks after you stopped breastfeeding.  You have questions about caring for yourself or your baby.  You pass a blood clot from your vagina. Get help right away if:  You have chest pain.  You have difficulty breathing.  You have sudden, severe leg pain.  You have severe pain or cramping in your lower abdomen.  You bleed from your vagina so much that you fill more than one sanitary pad in one hour. Bleeding should not be heavier than your heaviest period.  You develop a severe headache.  You faint.  You have blurred vision or spots in your vision.  You have bad-smelling vaginal discharge.  You have thoughts about hurting yourself or your baby. If you ever feel like you may hurt yourself or others, or have thoughts about taking your own life, get help right away. You can go to the nearest emergency department or call:  Your local emergency services (911 in the [place]  A suicide crisis helpline, such as the [place] at 928 312 9052. This is open 24 hours a day. Summary  The period of time right after you deliver your newborn up to 6-12 weeks after delivery is called the postpartum period.  Gradually return to your normal activities as told by your health care provider.  Keep all follow-up visits for you and your baby as told  by your health care provider. This information is not intended to replace advice given to you by your health care provider. Make sure you discuss any questions you have with your health care provider. Document Released: 06/15/2007 Document Revised: 08/21/2017 Document Reviewed: 06/01/2017 Elsevier Patient Education  2020 [employer].

## 2019-03-04 LAB — BPAM RBC
Blood Product Expiration Date: 202007092359
Blood Product Expiration Date: 202007142359
Blood Product Expiration Date: 202007142359
Blood Product Expiration Date: 202007142359
Blood Product Expiration Date: 202007172359
Blood Product Expiration Date: 202007172359
Blood Product Expiration Date: 202007172359
Blood Product Expiration Date: 202007192359
Blood Product Expiration Date: 202007222359
Blood Product Expiration Date: 202007232359
ISSUE DATE / TIME: 202006291912
ISSUE DATE / TIME: 202006291912
ISSUE DATE / TIME: 202006292008
ISSUE DATE / TIME: 202006292008
ISSUE DATE / TIME: 202006292357
Unit Type and Rh: 9500
Unit Type and Rh: 9500
Unit Type and Rh: 9500
Unit Type and Rh: 9500
Unit Type and Rh: 9500
Unit Type and Rh: 9500
Unit Type and Rh: 9500
Unit Type and Rh: 9500
Unit Type and Rh: 9500
Unit Type and Rh: 9500

## 2019-03-04 LAB — TYPE AND SCREEN
ABO/RH(D): O NEG
Antibody Screen: POSITIVE
Donor AG Type: NEGATIVE
Donor AG Type: NEGATIVE
Donor AG Type: NEGATIVE
Donor AG Type: NEGATIVE
Donor AG Type: NEGATIVE
Donor AG Type: NEGATIVE
Donor AG Type: NEGATIVE
Donor AG Type: NEGATIVE
Donor AG Type: NEGATIVE
Donor AG Type: NEGATIVE
PT AG Type: NEGATIVE
Unit division: 0
Unit division: 0
Unit division: 0
Unit division: 0
Unit division: 0
Unit division: 0
Unit division: 0
Unit division: 0
Unit division: 0
Unit division: 0

## 2019-03-09 NOTE — Discharge Summary (Signed)
OB Discharge Summary     Patient Name: Kristy Hines DOB: 1981-06-24 MRN: [id number]  Date of admission: 02/28/2019 Delivering MD: Janyth Pupa   Date of discharge: 03/03/2019  Admitting diagnosis: pregnancy Intrauterine pregnancy: [redacted]w[redacted]d     Secondary diagnosis:  Active Problems:   Labor and delivery indication for care or intervention  Additional problems: Cervical laceration, Postpartum hemorrhage, DIC     Discharge diagnosis: Term Pregnancy Delivered, PPH and DIC                                                                                                Post partum procedures:blood transfusion, Uterine artery embolization  Augmentation: Pitocin, Cytotec and Foley Balloon  Complications: [id number]  Hospital course:  Induction of Labor With Vaginal Delivery   38 y.o. yo G1P1001 at [redacted]w[redacted]d was admitted to the hospital 02/28/2019 for induction of labor.  Indication for induction: Postdates.  Patient had an uncomplicated labor course as follows: Membrane Rupture Time/Date: 12:45 PM ,02/28/2019   Intrapartum Procedures: Episiotomy: None [1]                                         Lacerations:  3rd degree [4]  Patient had delivery of a Viable infant.  Information for the patient's newborn:  Saba, Gomm [[id number]]  Delivery Method: Vag-Vacuum    02/28/2019  Details of delivery can be found in separate delivery note.  Following delivery, pt was noted to have continued bleeding.  She had an extensive cervical laceration and was taken to the operating room.  During repair, due to active bleeding she developed DIC.  She required a uterine artery embolization with interventional radiology and received multiple blood products.  Please see separate operative note for further details.  She was monitored closely in ICU for 24hr and was then transferred to a routine postpartum care.  Her VS and labs remained stable and she was discharged home in stable condition on POD #3,  03/03/2019  Physical exam  Vitals:   03/02/19 1525 03/02/19 1937 03/03/19 0445 03/03/19 0739  BP: (!) 96/59 (!) 112/59 107/62 (!) 96/54  Pulse: 87 83 77 72  Resp: 16 16 16 18   Temp: 98 F (36.7 C) 97.8 F (36.6 C) 97.7 F (36.5 C) 98.2 F (36.8 C)  TempSrc: Oral Oral Oral Oral  SpO2: 97% 94% 96% 96%  Weight:      Height:       General: alert, cooperative and no distress Lochia: appropriate Uterine Fundus: firm Incision: N/A DVT Evaluation: No evidence of DVT seen on physical exam. Labs: Lab Results  Component Value Date   WBC 13.4 (H) 03/01/2019   HGB 6.9 (LL) 03/02/2019   HCT 20.6 (L) 03/02/2019   MCV 86.7 03/01/2019   PLT 78 (L) 03/02/2019   CMP Latest Ref Rng & Units 03/02/2019  Glucose 70 - 99 mg/dL 83  BUN 6 - 20 mg/dL 10  Creatinine 0.44 - 1.00 mg/dL 0.84  Sodium 135 - 145 mmol/L  139  Potassium 3.5 - 5.1 mmol/L 3.9  Chloride 98 - 111 mmol/L 110  CO2 22 - 32 mmol/L 25  Calcium 8.9 - 10.3 mg/dL 7.7(L)  Total Protein 6.5 - 8.1 g/dL 4.0(L)  Total Bilirubin 0.3 - 1.2 mg/dL 0.5  Alkaline Phos 38 - 126 U/L 90  AST 15 - 41 U/L 20  ALT 0 - 44 U/L 10    Discharge instruction: per After Visit Summary and "Baby and Me Booklet".  After visit meds:  Allergies as of 03/03/2019   No Known Allergies     Medication List    STOP taking these medications   oxyCODONE-acetaminophen 5-325 MG tablet Commonly known as: Percocet   Sprintec 28 0.25-35 MG-MCG tablet Generic drug: norgestimate-ethinyl estradiol     TAKE these medications   acetaminophen 325 MG tablet Commonly known as: Tylenol Take 2 tablets (650 mg total) by mouth every 4 (four) hours as needed for moderate pain (for pain scale < 4).   docusate sodium 100 MG capsule Commonly known as: Colace Take 1 capsule (100 mg total) by mouth 2 (two) times daily.   ferrous gluconate 324 MG tablet Commonly known as: FERGON Take 1 tablet (324 mg total) by mouth 2 (two) times daily with a meal.   multivitamin  tablet Take 1 tablet by mouth daily.   oxyCODONE 5 MG immediate release tablet Commonly known as: Oxy IR/ROXICODONE Take 1-2 tablets every 6 hours as needed for pain   traMADol 50 MG tablet Commonly known as: ULTRAM Take 1 tablet (50 mg total) by mouth every 6 (six) hours as needed for up to 7 days.       Diet: routine diet  Activity: Advance as tolerated. Pelvic rest for 6 weeks.   Outpatient follow up:2 weeks Follow up Appt:No future appointments. Follow up Visit:No follow-ups on file.  Postpartum contraception: Not Discussed  Newborn Data: Live born female  Birth Weight: 7 lb 11.6 oz (3505 g) APGAR: 4, 9  Newborn Delivery   Birth date/time: 02/28/2019 16:46:00 Delivery type: Vaginal, Vacuum (Extractor)      Baby Feeding: Bottle and Breast Disposition:home with mother   03/09/2019 Sharon SellerJennifer M Ronee Ranganathan, DO

## 2019-05-12 DIAGNOSIS — Z30011 Encounter for initial prescription of contraceptive pills: Secondary | ICD-10-CM | POA: Diagnosis not present

## 2019-05-12 DIAGNOSIS — I83892 Varicose veins of left lower extremities with other complications: Secondary | ICD-10-CM | POA: Diagnosis not present

## 2019-05-12 DIAGNOSIS — N898 Other specified noninflammatory disorders of vagina: Secondary | ICD-10-CM | POA: Diagnosis not present

## 2019-10-28 DIAGNOSIS — Z3201 Encounter for pregnancy test, result positive: Secondary | ICD-10-CM | POA: Diagnosis not present

## 2019-10-28 DIAGNOSIS — N912 Amenorrhea, unspecified: Secondary | ICD-10-CM | POA: Diagnosis not present

## 2019-11-09 DIAGNOSIS — Z3480 Encounter for supervision of other normal pregnancy, unspecified trimester: Secondary | ICD-10-CM | POA: Diagnosis not present

## 2019-11-09 DIAGNOSIS — Z3201 Encounter for pregnancy test, result positive: Secondary | ICD-10-CM | POA: Diagnosis not present

## 2019-11-09 LAB — OB RESULTS CONSOLE RPR: RPR: NONREACTIVE

## 2019-11-09 LAB — OB RESULTS CONSOLE HEPATITIS B SURFACE ANTIGEN: Hepatitis B Surface Ag: NEGATIVE

## 2019-11-09 LAB — OB RESULTS CONSOLE RUBELLA ANTIBODY, IGM: Rubella: IMMUNE

## 2019-11-16 DIAGNOSIS — E041 Nontoxic single thyroid nodule: Secondary | ICD-10-CM | POA: Diagnosis not present

## 2019-11-16 DIAGNOSIS — Z331 Pregnant state, incidental: Secondary | ICD-10-CM | POA: Diagnosis not present

## 2019-11-25 DIAGNOSIS — O09521 Supervision of elderly multigravida, first trimester: Secondary | ICD-10-CM | POA: Diagnosis not present

## 2019-11-25 DIAGNOSIS — O0991 Supervision of high risk pregnancy, unspecified, first trimester: Secondary | ICD-10-CM | POA: Diagnosis not present

## 2019-12-01 DIAGNOSIS — Z3480 Encounter for supervision of other normal pregnancy, unspecified trimester: Secondary | ICD-10-CM | POA: Diagnosis not present

## 2019-12-28 ENCOUNTER — Other Ambulatory Visit (HOSPITAL_COMMUNITY): Payer: Self-pay | Admitting: Obstetrics & Gynecology

## 2019-12-28 DIAGNOSIS — Z363 Encounter for antenatal screening for malformations: Secondary | ICD-10-CM

## 2020-01-20 DIAGNOSIS — O09522 Supervision of elderly multigravida, second trimester: Secondary | ICD-10-CM | POA: Diagnosis not present

## 2020-01-20 DIAGNOSIS — Z3480 Encounter for supervision of other normal pregnancy, unspecified trimester: Secondary | ICD-10-CM | POA: Diagnosis not present

## 2020-01-26 ENCOUNTER — Encounter: Payer: Self-pay | Admitting: *Deleted

## 2020-02-01 ENCOUNTER — Other Ambulatory Visit: Payer: Self-pay

## 2020-02-01 ENCOUNTER — Other Ambulatory Visit (HOSPITAL_COMMUNITY): Payer: Self-pay | Admitting: Obstetrics & Gynecology

## 2020-02-01 ENCOUNTER — Ambulatory Visit (HOSPITAL_COMMUNITY): Payer: Medicaid Other | Attending: Obstetrics and Gynecology

## 2020-02-01 ENCOUNTER — Other Ambulatory Visit: Payer: Self-pay | Admitting: *Deleted

## 2020-02-01 ENCOUNTER — Ambulatory Visit: Payer: Medicaid Other | Admitting: *Deleted

## 2020-02-01 VITALS — BP 108/64 | HR 105

## 2020-02-01 DIAGNOSIS — Z363 Encounter for antenatal screening for malformations: Secondary | ICD-10-CM

## 2020-02-01 DIAGNOSIS — O09892 Supervision of other high risk pregnancies, second trimester: Secondary | ICD-10-CM | POA: Diagnosis not present

## 2020-02-01 DIAGNOSIS — Z3686 Encounter for antenatal screening for cervical length: Secondary | ICD-10-CM

## 2020-02-01 DIAGNOSIS — O09522 Supervision of elderly multigravida, second trimester: Secondary | ICD-10-CM

## 2020-02-01 DIAGNOSIS — O09292 Supervision of pregnancy with other poor reproductive or obstetric history, second trimester: Secondary | ICD-10-CM | POA: Diagnosis not present

## 2020-02-01 DIAGNOSIS — O26872 Cervical shortening, second trimester: Secondary | ICD-10-CM

## 2020-02-01 DIAGNOSIS — Z3A19 19 weeks gestation of pregnancy: Secondary | ICD-10-CM

## 2020-02-01 DIAGNOSIS — O09529 Supervision of elderly multigravida, unspecified trimester: Secondary | ICD-10-CM

## 2020-02-01 DIAGNOSIS — O99842 Bariatric surgery status complicating pregnancy, second trimester: Secondary | ICD-10-CM

## 2020-02-17 ENCOUNTER — Other Ambulatory Visit (HOSPITAL_COMMUNITY): Payer: BLUE CROSS/BLUE SHIELD

## 2020-02-17 ENCOUNTER — Encounter (HOSPITAL_COMMUNITY): Payer: Self-pay | Admitting: *Deleted

## 2020-02-17 ENCOUNTER — Ambulatory Visit: Payer: BLUE CROSS/BLUE SHIELD | Attending: Obstetrics and Gynecology

## 2020-02-17 ENCOUNTER — Ambulatory Visit: Payer: BLUE CROSS/BLUE SHIELD | Admitting: *Deleted

## 2020-02-17 ENCOUNTER — Other Ambulatory Visit: Payer: Self-pay

## 2020-02-17 ENCOUNTER — Telehealth (HOSPITAL_COMMUNITY): Payer: Self-pay | Admitting: *Deleted

## 2020-02-17 VITALS — BP 111/73 | HR 109

## 2020-02-17 DIAGNOSIS — O358XX0 Maternal care for other (suspected) fetal abnormality and damage, not applicable or unspecified: Secondary | ICD-10-CM

## 2020-02-17 DIAGNOSIS — Z3A21 21 weeks gestation of pregnancy: Secondary | ICD-10-CM

## 2020-02-17 DIAGNOSIS — O0990 Supervision of high risk pregnancy, unspecified, unspecified trimester: Secondary | ICD-10-CM

## 2020-02-17 DIAGNOSIS — Z3686 Encounter for antenatal screening for cervical length: Secondary | ICD-10-CM

## 2020-02-17 DIAGNOSIS — O09529 Supervision of elderly multigravida, unspecified trimester: Secondary | ICD-10-CM

## 2020-02-17 DIAGNOSIS — O09292 Supervision of pregnancy with other poor reproductive or obstetric history, second trimester: Secondary | ICD-10-CM | POA: Diagnosis not present

## 2020-02-17 DIAGNOSIS — O09892 Supervision of other high risk pregnancies, second trimester: Secondary | ICD-10-CM | POA: Diagnosis not present

## 2020-02-17 DIAGNOSIS — O99842 Bariatric surgery status complicating pregnancy, second trimester: Secondary | ICD-10-CM

## 2020-02-17 DIAGNOSIS — O26872 Cervical shortening, second trimester: Secondary | ICD-10-CM

## 2020-02-17 DIAGNOSIS — O09522 Supervision of elderly multigravida, second trimester: Secondary | ICD-10-CM

## 2020-02-17 DIAGNOSIS — O99212 Obesity complicating pregnancy, second trimester: Secondary | ICD-10-CM

## 2020-02-17 DIAGNOSIS — E669 Obesity, unspecified: Secondary | ICD-10-CM

## 2020-02-17 NOTE — Telephone Encounter (Signed)
PT instructed on her covid appointment date and time.  Arrival at hospital Sunday at 0615 for cerclage.  NPO after midnight.  No po meds to take.  All questions answered and verbalized understanding of instructions.

## 2020-02-17 NOTE — Telephone Encounter (Signed)
Preadmission screen  

## 2020-02-18 ENCOUNTER — Other Ambulatory Visit (HOSPITAL_COMMUNITY)
Admission: RE | Admit: 2020-02-18 | Discharge: 2020-02-18 | Disposition: A | Discharge status: Home / Self Care | Payer: BLUE CROSS/BLUE SHIELD | Source: Ambulatory Visit | Attending: Obstetrics & Gynecology | Admitting: Obstetrics & Gynecology

## 2020-02-18 DIAGNOSIS — Z01812 Encounter for preprocedural laboratory examination: Secondary | ICD-10-CM | POA: Diagnosis not present

## 2020-02-18 DIAGNOSIS — Z20822 Contact with and (suspected) exposure to covid-19: Secondary | ICD-10-CM | POA: Insufficient documentation

## 2020-02-18 LAB — SARS CORONAVIRUS 2 (TAT 6-24 HRS): SARS Coronavirus 2: NEGATIVE

## 2020-02-18 NOTE — H&P (Signed)
39 y/o G2P1001 @ [redacted]w[redacted]d estimated gestational age (as dated by LMP c/w 1st trimester ultrasound) presents for rescue cerclage.  Initial 20wk scan showed cervical length of 1.9cm.  Per MFM's recommendation- f/u US and start vaginal progesterone.  Follow up US showed cervical length of ~ 1cm.  MFM recommendation was to proceed with cervical cerclage  no Leaking of Fluid,   no Vaginal Bleeding,   no Uterine Contractions,  + Fetal Movement.  Prenatal care has been provided by Dr. Charlotta Newton  ROS: no HA, no epigastric pain, no visual changes.    Pregnancy complicated by: 1) AMA- low risk female, normal anatomy scan 2) anti C&E antibodies weak titer, FOB negative 3) h/o Gastric bypass 4) Obesity- BMI: 38 5) prior pregnancy c/b deep cervical laceration- c/b PPH (EBL ~4L), DIC- taken to OR and ultimately required a uterine a. Embolization    Prenatal Transfer Tool  Maternal Diabetes: No Genetic Screening: Normal Maternal Ultrasounds/Referrals: see above Fetal Ultrasounds or other Referrals:  None, Referred to Materal Fetal Medicine  Maternal Substance Abuse:  No Significant Maternal Medications:  None Significant Maternal Lab Results: Group B Strepunknown   PNL:  GBS unknown, Rub Immune, Hep B neg, RPR NR, HIV neg, GC/C neg, glucola:not yet completed Blood type: O neg   OBHx: 01/2019- NSVD with complications- PPH due to cervical laceration-taken to the OR for repair and ultimately [place] PMHx:  obesity Meds:  PNV Allergy:  No Known Allergies SurgHx: gastric bypass, surgical laceration repair SocHx:   no Tobacco, no  EtOH, no Illicit Drugs  O: BP 109/90   Pulse 100   Temp 98 F (36.7 C) (Oral)   Resp 20   Ht 5\' 7"  (1.702 m)   Wt 109.3 kg   LMP 09/20/2019 (Exact Date)   BMI 37.75 kg/m    Gen. AAOx3, NAD CV.  RRR  No murmur.  Resp. CTAB, no wheeze or crackles. Abd. Gravid,  no tenderness,  no rigidity,  no guarding Extr.  no edema B/L , no calf tenderness, neg Homan's  B/L  FHT:  140bpm   Labs:  Results for orders placed or performed during the hospital encounter of 02/18/20 (from the past 24 hour(s))  SARS CORONAVIRUS 2 (TAT 6-24 HRS) Nasopharyngeal Nasopharyngeal Swab     Status: None   Collection Time: 02/18/20 10:31 AM   Specimen: Nasopharyngeal Swab  Result Value Ref Range   SARS Coronavirus 2 NEGATIVE NEGATIVE     A/P:  39 y.o. 39 [redacted]w[redacted]d for rescue cerclage due to cervical shortening -NPO -LR @ 125cc/hr -FWB reassuring by doppler -Risk/benefits reviewed with patient- consent completed by Dr. [redacted]w[redacted]d- see note  Judeth Cornfield, DO [phone] (cell) [phone] (office)

## 2020-02-19 ENCOUNTER — Encounter (HOSPITAL_COMMUNITY): Payer: Self-pay | Admitting: Obstetrics and Gynecology

## 2020-02-19 ENCOUNTER — Other Ambulatory Visit: Payer: Self-pay

## 2020-02-19 ENCOUNTER — Encounter (HOSPITAL_COMMUNITY)
Admission: EM | Disposition: A | Discharge status: Home / Self Care | Payer: Self-pay | Source: Home / Self Care | Attending: Obstetrics and Gynecology

## 2020-02-19 ENCOUNTER — Inpatient Hospital Stay (HOSPITAL_COMMUNITY): Payer: BLUE CROSS/BLUE SHIELD | Admitting: Anesthesiology

## 2020-02-19 ENCOUNTER — Observation Stay (HOSPITAL_COMMUNITY)
Admission: EM | Admit: 2020-02-19 | Discharge: 2020-02-19 | Disposition: A | Discharge status: Home / Self Care | Payer: BLUE CROSS/BLUE SHIELD | Attending: Obstetrics & Gynecology | Admitting: Obstetrics & Gynecology

## 2020-02-19 DIAGNOSIS — Z9884 Bariatric surgery status: Secondary | ICD-10-CM | POA: Insufficient documentation

## 2020-02-19 DIAGNOSIS — Z20822 Contact with and (suspected) exposure to covid-19: Secondary | ICD-10-CM | POA: Insufficient documentation

## 2020-02-19 DIAGNOSIS — O26872 Cervical shortening, second trimester: Secondary | ICD-10-CM | POA: Diagnosis present

## 2020-02-19 DIAGNOSIS — O3432 Maternal care for cervical incompetence, second trimester: Principal | ICD-10-CM | POA: Insufficient documentation

## 2020-02-19 DIAGNOSIS — Z3A21 21 weeks gestation of pregnancy: Secondary | ICD-10-CM | POA: Diagnosis not present

## 2020-02-19 DIAGNOSIS — O99212 Obesity complicating pregnancy, second trimester: Secondary | ICD-10-CM | POA: Insufficient documentation

## 2020-02-19 HISTORY — PX: CERVICAL CERCLAGE: SHX1329

## 2020-02-19 LAB — CBC
HCT: 34.7 % — ABNORMAL LOW (ref 36.0–46.0)
Hemoglobin: 11 g/dL — ABNORMAL LOW (ref 12.0–15.0)
MCH: 29.6 pg (ref 26.0–34.0)
MCHC: 31.7 g/dL (ref 30.0–36.0)
MCV: 93.3 fL (ref 80.0–100.0)
Platelets: 202 10*3/uL (ref 150–400)
RBC: 3.72 MIL/uL — ABNORMAL LOW (ref 3.87–5.11)
RDW: 12.6 % (ref 11.5–15.5)
WBC: 7.5 10*3/uL (ref 4.0–10.5)
nRBC: 0 % (ref 0.0–0.2)

## 2020-02-19 SURGERY — CERCLAGE, CERVIX, VAGINAL APPROACH
Anesthesia: Spinal | Wound class: Clean Contaminated

## 2020-02-19 MED ORDER — OXYCODONE HCL 5 MG PO TABS: 5.0000 mg | ORAL_TABLET | Freq: Once | ORAL | Status: DC | PRN

## 2020-02-19 MED ORDER — KETOROLAC TROMETHAMINE 30 MG/ML IJ SOLN
INTRAMUSCULAR | Status: AC
  Filled 2020-02-19: qty 1

## 2020-02-19 MED ORDER — BUPIVACAINE IN DEXTROSE 0.75-8.25 % IT SOLN
INTRATHECAL | Status: DC | PRN
  Administered 2020-02-19: 1 mL via INTRATHECAL

## 2020-02-19 MED ORDER — KETOROLAC TROMETHAMINE 30 MG/ML IJ SOLN
30.0000 mg | Freq: Once | INTRAMUSCULAR | Status: AC | PRN
  Administered 2020-02-19: 30 mg via INTRAVENOUS

## 2020-02-19 MED ORDER — LACTATED RINGERS IV SOLN: INTRAVENOUS | Status: DC

## 2020-02-19 MED ORDER — POVIDONE-IODINE 10 % EX SWAB: 2.0000 "application " | Freq: Once | CUTANEOUS | Status: DC

## 2020-02-19 MED ORDER — OXYCODONE HCL 5 MG/5ML PO SOLN: 5.0000 mg | Freq: Once | ORAL | Status: DC | PRN

## 2020-02-19 MED ORDER — HYDROMORPHONE HCL 1 MG/ML IJ SOLN
0.2500 mg | INTRAMUSCULAR | Status: DC | PRN
  Administered 2020-02-19: 0.5 mg via INTRAVENOUS

## 2020-02-19 MED ORDER — HYDROMORPHONE HCL 1 MG/ML IJ SOLN
INTRAMUSCULAR | Status: AC
  Filled 2020-02-19: qty 0.5

## 2020-02-19 MED ORDER — ONDANSETRON HCL 4 MG/2ML IJ SOLN: 4.0000 mg | Freq: Once | INTRAMUSCULAR | Status: DC | PRN

## 2020-02-19 SURGICAL SUPPLY — 20 items
CANISTER SUCT 3000ML PPV (MISCELLANEOUS) ×2 IMPLANT
CATH CERVICAL RIPENING BALLOON (CATHETERS) ×2 IMPLANT
ELECT REM PT RETURN 9FT ADLT (ELECTROSURGICAL) ×2
ELECTRODE REM PT RTRN 9FT ADLT (ELECTROSURGICAL) ×1 IMPLANT
GLOVE BIO SURGEON STRL SZ7.5 (GLOVE) ×2 IMPLANT
GLOVE BIOGEL PI IND STRL 8 (GLOVE) ×1 IMPLANT
GLOVE BIOGEL PI INDICATOR 8 (GLOVE) ×1
GOWN STRL REUS W/ TWL LRG LVL3 (GOWN DISPOSABLE) ×2 IMPLANT
GOWN STRL REUS W/TWL LRG LVL3 (GOWN DISPOSABLE) ×4
PACK VAGINAL MINOR WOMEN LF (CUSTOM PROCEDURE TRAY) ×3 IMPLANT
PAD OB MATERNITY 4.3X12.25 (PERSONAL CARE ITEMS) ×2 IMPLANT
PAD PREP 24X48 CUFFED NSTRL (MISCELLANEOUS) ×2 IMPLANT
PENCIL BUTTON HOLSTER BLD 10FT (ELECTRODE) ×2 IMPLANT
SUT CHROMIC 3 0 CT 36 (SUTURE) ×1 IMPLANT
SUT MERSILENE FIBER S 5 MO-4 1 (SUTURE) ×2 IMPLANT
SYR BULB IRRIGATION 50ML (SYRINGE) IMPLANT
TOWEL OR 17X24 6PK STRL BLUE (TOWEL DISPOSABLE) ×4 IMPLANT
TRAY FOLEY W/BAG SLVR 14FR (SET/KITS/TRAYS/PACK) ×2 IMPLANT
TUBING NON-CON 1/4 X 20 CONN (TUBING) ×2 IMPLANT
YANKAUER SUCT BULB TIP NO VENT (SUCTIONS) ×2 IMPLANT

## 2020-02-19 NOTE — Discharge Instructions (Signed)
Cervical Cerclage, Care After This sheet gives you information about how to care for yourself after your procedure. Your health care provider may also give you more specific instructions. If you have problems or questions, contact your health care provider. What can I expect after the procedure? After your procedure, it is common to have:  Cramping in your abdomen.  Mucus discharge from your vagina. This may last for several days.  Painful urination (dysuria).  Spotting, or small drops of blood coming from your vagina. Follow these instructions at home:  Medicines  For pelvic pain and discomfort- you may take tylenol over the counter  Ask your health care provider if the medicine prescribed to you requires you to avoid driving or using heavy machinery. General instructions  For the next 2 weeks:  No heavy lifting, bending or pulling.  No intercourse  Keep track of your vaginal discharge and watch for any changes. If you notice changes, tell your health care provider.  Avoid physical activities and exercise until your health care provider approves. Ask your health care provider what activities are safe for you.  Do not douche or have sex until your health care provider says it is okay to do so.  Keep all follow-up visits, including prenatal visits, as told by your health care provider. This is important. ? Prenatal visits are all the care that you receive before the birth of your baby. ? You may also need an ultrasound.  You may be asked to have weekly visits to have your cervix checked. Contact a health care provider if you:  Have abnormal discharge from your vagina, such as clots.  Have a bad-smelling discharge from your vagina.  Develop a rash on your skin. This may look like redness and swelling.  Become light-headed or feel like you are going to faint.  Have abdominal pain that does not get better with medicine.  Have nausea or vomiting that does not go away. Get  help right away if you:  Have vaginal bleeding that is heavier or more frequent than spotting.  Are leaking fluid or your water breaks.  Have a fever or chills.  Faint.  Have uterine contractions. These may feel like: ? A back ache. ? Lower abdominal pain. ? Mild cramps, similar to menstrual cramps. ? Tightening or pressure in your abdomen.  Think that your baby is not moving as much as usual, or you cannot feel your baby move.  Have chest pain.  Have shortness of breath. Summary  After the procedure, it is common to have cramping, vaginal discharge, painful urination, and small drops of blood coming from your vagina.  If you are told to go on bed rest, follow instructions from your health care provider. You may need to be on bed rest for up to 3 days.  Keep track of your vaginal discharge and watch for any changes. If you notice changes, tell your health care provider.  Contact a health care provider if you have abnormal vaginal discharge, become light-headed, or have pain that cannot be controlled with medicines.  Get help right away if you have heavy vaginal bleeding, your water breaks, or you have uterine contractions. Also, get help right away if your baby is not moving as much as usual, or you have chest pain or shortness of breath. This information is not intended to replace advice given to you by your health care provider. Make sure you discuss any questions you have with your health care provider. Document Revised:  06/14/2019 Document Reviewed: 04/13/2019 Elsevier Patient Education  [place].

## 2020-02-19 NOTE — Transfer of Care (Signed)
Immediate Anesthesia Transfer of Care Note  Patient: Kristy Hines  Procedure(s) Performed: CERCLAGE CERVICAL (N/A )  Patient Location: PACU  Anesthesia Type:Spinal  Level of Consciousness: awake, alert  and oriented  Airway & Oxygen Therapy: Patient Spontanous Breathing  Post-op Assessment: Report given to RN and Post -op Vital signs reviewed and stable  Post vital signs: Reviewed and stable  Last Vitals:  Vitals Value Taken Time  BP    Temp    Pulse    Resp    SpO2      Last Pain:  Vitals:   02/19/20 0713  TempSrc: Oral         Complications: No complications documented.

## 2020-02-19 NOTE — Anesthesia Postprocedure Evaluation (Signed)
Anesthesia Post Note  Patient: ELVERA ALMARIO  Procedure(s) Performed: CERCLAGE CERVICAL (N/A )     Patient location during evaluation: PACU Anesthesia Type: Spinal Level of consciousness: oriented and awake and alert Pain management: pain level controlled Vital Signs Assessment: post-procedure vital signs reviewed and stable Respiratory status: spontaneous breathing, respiratory function stable and patient connected to nasal cannula oxygen Cardiovascular status: blood pressure returned to baseline and stable Postop Assessment: no headache, no backache and no apparent nausea or vomiting Anesthetic complications: no   No complications documented.  Last Vitals:  Vitals:   02/19/20 0713 02/19/20 0930  BP:  112/69  Pulse:  69  Resp:  12  Temp: 36.7 C   SpO2:  100%    Last Pain:  Vitals:   02/19/20 0930  TempSrc:   PainSc: 0-No pain   Pain Goal:                   Trevor Iha

## 2020-02-19 NOTE — Progress Notes (Signed)
Maternal-Fetal Medicine  Ms. Kristy Hines, G2 P1 at 21-[redacted] weeks gestation, is admitted for rescue cerclage. On transvaginal scan performed on 02/17/20, the shortest cervical length measurement was 9 millimeters. Patient has been taking vaginal progesterone.  I had then counseled her cerclage and the patient opted to have rescue cerclage procedure.  Today, I explained the procedure with help of diagrams. I discussed the possible complications including bleeding, infection, miscarriage and injuries to bladder or bowel (all uncommon).   I also informed her that cerclage does not guarantee carrying pregnancy to term. Alternatively, she can continue vaginal progesterone (inferior option).  Patient opted to have rescue cerclage procedure.

## 2020-02-19 NOTE — Progress Notes (Signed)
Vaginal packing removed without incident.  Pt ambulated with assistance to the bathroom and voided on her own.

## 2020-02-19 NOTE — Brief Op Note (Signed)
02/19/2020  9:45 AM  PATIENT:  Kennon Holter  39 y.o. female  PRE-OPERATIVE DIAGNOSIS: 21-weeks' pregnancy with cervical incompetence  POST-OPERATIVE DIAGNOSIS: Same with rescue cerclage  PROCEDURE:  Procedure(s): CERCLAGE CERVICAL (N/A)  SURGEON:  Surgeon(s) and Role:    * Noralee Space, MD - Primary    * Myna Hidalgo, DO  PHYSICIAN ASSISTANT:   ASSISTANTS: none   ANESTHESIA:   spinal  EBL:  50 milliliters  BLOOD ADMINISTERED:none  DRAINS: none   LOCAL MEDICATIONS USED:  NONE  SPECIMEN:  No Specimen  DISPOSITION OF SPECIMEN:  N/A  COUNTS:  YES  TOURNIQUET:  * No tourniquets in log *  DICTATION: .Note written in EPIC  PLAN OF CARE: Discharge to home after PACU  PATIENT DISPOSITION:  PACU - hemodynamically stable.   Delay start of Pharmacological VTE agent (>24hrs) due to surgical blood loss or risk of bleeding: not applicable

## 2020-02-19 NOTE — Op Note (Signed)
Maternal-Fetal Medicine   Name: Kristy Hines MRN: [id number] Procedure: Rescue cerclage   PRE-OPERATIVE DIAGNOSIS:  21-weeks' gestation with cervical insufficiency  POST-OPERATIVE DIAGNOSIS:  Same with rescue cerclage   PROCEDURE:  Procedure(s): RESCUE CERCLAGE CERVICAL   SURGEON:  Surgeon(s) and Role:  Noralee Space, MD - Primary   * Myna Hidalgo, MD - Assisting      ANESTHESIA:   spinal    I discussed procedure and complications in detail before obtaining consent in the Pre-op area. After informed consent, the patient was taken to the OR and after spinal anesthesia, the patient was prepped and draped in dorsal lithotomy position. A sterile weighted speculum was inserted.  The external os closed. The cervix Anteriorly, the cervix was about 1.5 cm long. Posteriorly, the cervix was deficient (previous tear) and it Was flushed with the vagina from almost 8 O'clock to the 4 O'clock position.   The cervix and vagina appeared healthy. The anterior lip of the cervix was held with a ring forceps.  With help of Bovie, a small incision of about 2 cm was made at the cervicovesical junction and the bladder was gently pushed up. Posteriorly, at the 6 O'clock position, a small incision (1 cm) was made with Bovie  And the mucosa was pushed up to create rectovaginal space that could not be created well. Further  Attempts with Bovie was not made to avoid the possibility of rectal injury.   A Mersilene 5 tape stitch was placed circumferentially starting at 11 O'Clock position and around the cervix. Final stitch exited close to 1 O'Clock position. Posteriorly, the stitch was placed high close to the cervix. All around, the stitch was placed as high as possible.  Before tying the knot, rectal examination was performed to ensure that no inadvertent stitch was placed on the rectal mucosa. Five secure knots were placed anteriorly. The external os was closed.  Slight bleeding seen at the bladder  reflection site was closed with 3/0 chromic mattress sutures. Bladder was catheterized to ensure clear urine was seen. No hematuria was present. Vagina was packed with a single pack that will be removed before discharge in the post-op area. Counts correct. Estimated blood loss: 50 milliliters.    I reassured the patient of successful procedure. I recommended that she continue taking vaginal progesterone from tomorrow night. Patient left the operating room in stable condition. . -We made an appointment for [facility]21for transvaginal ultrasound to evaluate the cervix.

## 2020-02-19 NOTE — Anesthesia Preprocedure Evaluation (Signed)
Anesthesia Evaluation  Patient identified by MRN, date of birth, ID band Patient awake    Reviewed: Allergy & Precautions, NPO status , Patient's Chart, lab work & pertinent test results  Airway Mallampati: II  TM Distance: >3 FB Neck ROM: Full    Dental no notable dental hx. (+) Teeth Intact   Pulmonary neg pulmonary ROS,    Pulmonary exam normal breath sounds clear to auscultation       Cardiovascular negative cardio ROS Normal cardiovascular exam Rhythm:Regular Rate:Normal     Neuro/Psych negative neurological ROS  negative psych ROS   GI/Hepatic negative GI ROS, Neg liver ROS,   Endo/Other  negative endocrine ROS  Renal/GU negative Renal ROS     Musculoskeletal negative musculoskeletal ROS (+)   Abdominal (+) + obese,   Peds  Hematology Lab Results      Component                Value               Date                      WBC                      7.5                 02/19/2020                HGB                      11.0 (L)            02/19/2020                HCT                      34.7 (L)            02/19/2020                MCV                      93.3                02/19/2020                PLT                      202                 02/19/2020              Anesthesia Other Findings   Reproductive/Obstetrics (+) Pregnancy                             Anesthesia Physical Anesthesia Plan  ASA: III  Anesthesia Plan: Spinal   Post-op Pain Management:    Induction:   PONV Risk Score and Plan: Treatment may vary due to age or medical condition  Airway Management Planned: Nasal Cannula and Natural Airway  Additional Equipment:   Intra-op Plan:   Post-operative Plan:   Informed Consent: I have reviewed the patients History and Physical, chart, labs and discussed the procedure including the risks, benefits and alternatives for the proposed anesthesia with the  patient or authorized representative who has indicated his/her understanding and acceptance.  Dental advisory given  Plan Discussed with:   Anesthesia Plan Comments: (21.5 wk G2P1 for cervical cerclage)        Anesthesia Quick Evaluation

## 2020-02-20 ENCOUNTER — Other Ambulatory Visit: Payer: Self-pay | Admitting: *Deleted

## 2020-02-20 DIAGNOSIS — O09522 Supervision of elderly multigravida, second trimester: Secondary | ICD-10-CM

## 2020-02-22 LAB — TYPE AND SCREEN
ABO/RH(D): O NEG
Antibody Screen: POSITIVE
Donor AG Type: NEGATIVE
Donor AG Type: NEGATIVE
Unit division: 0
Unit division: 0

## 2020-02-22 LAB — BPAM RBC
Blood Product Expiration Date: 202107052359
Blood Product Expiration Date: 202107092359
Unit Type and Rh: 9500
Unit Type and Rh: 9500

## 2020-02-26 DIAGNOSIS — Z6834 Body mass index (BMI) 34.0-34.9, adult: Secondary | ICD-10-CM | POA: Diagnosis not present

## 2020-02-26 DIAGNOSIS — O26892 Other specified pregnancy related conditions, second trimester: Secondary | ICD-10-CM | POA: Diagnosis not present

## 2020-02-26 DIAGNOSIS — R197 Diarrhea, unspecified: Secondary | ICD-10-CM | POA: Diagnosis not present

## 2020-02-26 DIAGNOSIS — Z3A22 22 weeks gestation of pregnancy: Secondary | ICD-10-CM | POA: Diagnosis not present

## 2020-02-26 DIAGNOSIS — O210 Mild hyperemesis gravidarum: Secondary | ICD-10-CM | POA: Diagnosis not present

## 2020-02-27 NOTE — Discharge Summary (Signed)
Physician Discharge Summary  Patient ID: Kristy Hines MRN: [id number] DOB/AGE: 1980-10-16 39 y.o.  Admit date: 02/19/2020 Discharge date: 02/27/2020  Admission Diagnoses: Cervical shortening, intrauterine pregnancy, AMA  Discharge Diagnoses:  Active Problems:   Cervical shortening affecting pregnancy in second trimester   Discharged Condition: stable  Hospital Course: 39yo G2P1001@2125d  who presented for scheduled cerclage.  See Op note regarding scheduled cerclage. Her postop course was uncomplicated and she was discharged home same day with close outpatient follow-up.  Consults: None  Significant Diagnostic Studies: none  Treatments: IV hydration and surgery: cervical cerclage  Discharge Exam: Blood pressure 107/63, pulse 71, temperature 98 F (36.7 C), temperature source Oral, resp. rate 16, height 5\' 7"  (1.702 m), weight 109.3 kg, last menstrual period 09/20/2019, SpO2 97 %, unknown if currently breastfeeding. General appearance: alert and cooperative GI: gravid, non-tender Skin: Skin color, texture, turgor normal. No rashes or lesions Neurologic: Grossly normal  Disposition: Discharge disposition: 01-Home or Self Care        Allergies as of 02/19/2020   No Known Allergies     Medication List    STOP taking these medications   oxyCODONE 5 MG immediate release tablet Commonly known as: Oxy IR/ROXICODONE     TAKE these medications   acetaminophen 325 MG tablet Commonly known as: Tylenol Take 2 tablets (650 mg total) by mouth every 4 (four) hours as needed for moderate pain (for pain scale < 4).   docusate sodium 100 MG capsule Commonly known as: Colace Take 1 capsule (100 mg total) by mouth 2 (two) times daily.   ferrous gluconate 324 MG tablet Commonly known as: FERGON Take 1 tablet (324 mg total) by mouth 2 (two) times daily with a meal.   multivitamin tablet Take 1 tablet by mouth daily.   PROGESTERONE VA Place vaginally.       Follow-up  Information    02/21/2020, DO Follow up in 3 week(s).   Specialty: Obstetrics and Gynecology Contact information: [address] [place] [phone]               Signed: [phone] 02/27/2020, 11:11 AM

## 2020-03-01 ENCOUNTER — Ambulatory Visit: Payer: BLUE CROSS/BLUE SHIELD | Attending: Obstetrics and Gynecology

## 2020-03-01 ENCOUNTER — Ambulatory Visit: Payer: BLUE CROSS/BLUE SHIELD | Admitting: *Deleted

## 2020-03-01 ENCOUNTER — Other Ambulatory Visit: Payer: Self-pay

## 2020-03-01 VITALS — BP 106/64 | HR 85

## 2020-03-01 DIAGNOSIS — O09292 Supervision of pregnancy with other poor reproductive or obstetric history, second trimester: Secondary | ICD-10-CM | POA: Diagnosis not present

## 2020-03-01 DIAGNOSIS — O358XX0 Maternal care for other (suspected) fetal abnormality and damage, not applicable or unspecified: Secondary | ICD-10-CM

## 2020-03-01 DIAGNOSIS — O0990 Supervision of high risk pregnancy, unspecified, unspecified trimester: Secondary | ICD-10-CM | POA: Diagnosis not present

## 2020-03-01 DIAGNOSIS — O09892 Supervision of other high risk pregnancies, second trimester: Secondary | ICD-10-CM | POA: Diagnosis not present

## 2020-03-01 DIAGNOSIS — O99212 Obesity complicating pregnancy, second trimester: Secondary | ICD-10-CM

## 2020-03-01 DIAGNOSIS — E669 Obesity, unspecified: Secondary | ICD-10-CM

## 2020-03-01 DIAGNOSIS — O09522 Supervision of elderly multigravida, second trimester: Secondary | ICD-10-CM | POA: Diagnosis not present

## 2020-03-01 DIAGNOSIS — O99842 Bariatric surgery status complicating pregnancy, second trimester: Secondary | ICD-10-CM

## 2020-03-01 DIAGNOSIS — Z3686 Encounter for antenatal screening for cervical length: Secondary | ICD-10-CM

## 2020-03-01 DIAGNOSIS — Z3A23 23 weeks gestation of pregnancy: Secondary | ICD-10-CM

## 2020-03-01 DIAGNOSIS — O26872 Cervical shortening, second trimester: Secondary | ICD-10-CM

## 2020-03-23 DIAGNOSIS — O26899 Other specified pregnancy related conditions, unspecified trimester: Secondary | ICD-10-CM | POA: Diagnosis not present

## 2020-03-23 DIAGNOSIS — O09522 Supervision of elderly multigravida, second trimester: Secondary | ICD-10-CM | POA: Diagnosis not present

## 2020-04-05 DIAGNOSIS — Z3483 Encounter for supervision of other normal pregnancy, third trimester: Secondary | ICD-10-CM | POA: Diagnosis not present

## 2020-04-05 DIAGNOSIS — O09523 Supervision of elderly multigravida, third trimester: Secondary | ICD-10-CM | POA: Diagnosis not present

## 2020-04-18 DIAGNOSIS — O26872 Cervical shortening, second trimester: Secondary | ICD-10-CM | POA: Diagnosis not present

## 2020-04-18 DIAGNOSIS — O09522 Supervision of elderly multigravida, second trimester: Secondary | ICD-10-CM | POA: Diagnosis not present

## 2020-04-18 DIAGNOSIS — Z23 Encounter for immunization: Secondary | ICD-10-CM | POA: Diagnosis not present

## 2020-04-18 DIAGNOSIS — Z3686 Encounter for antenatal screening for cervical length: Secondary | ICD-10-CM | POA: Diagnosis not present

## 2020-05-18 DIAGNOSIS — R05 Cough: Secondary | ICD-10-CM | POA: Diagnosis not present

## 2020-05-25 ENCOUNTER — Other Ambulatory Visit: Payer: Self-pay

## 2020-05-25 ENCOUNTER — Encounter (HOSPITAL_COMMUNITY): Payer: Self-pay | Admitting: Obstetrics & Gynecology

## 2020-05-25 ENCOUNTER — Inpatient Hospital Stay (HOSPITAL_COMMUNITY)
Admission: AD | Admit: 2020-05-25 | Discharge: 2020-06-05 | DRG: 786 | Disposition: A | Discharge status: Home / Self Care | Payer: BLUE CROSS/BLUE SHIELD | Attending: Internal Medicine | Admitting: Internal Medicine

## 2020-05-25 ENCOUNTER — Inpatient Hospital Stay (HOSPITAL_COMMUNITY): Payer: BLUE CROSS/BLUE SHIELD

## 2020-05-25 DIAGNOSIS — R0902 Hypoxemia: Secondary | ICD-10-CM

## 2020-05-25 DIAGNOSIS — J1282 Pneumonia due to coronavirus disease 2019: Secondary | ICD-10-CM | POA: Diagnosis present

## 2020-05-25 DIAGNOSIS — O99844 Bariatric surgery status complicating childbirth: Secondary | ICD-10-CM | POA: Diagnosis present

## 2020-05-25 DIAGNOSIS — Z3A00 Weeks of gestation of pregnancy not specified: Secondary | ICD-10-CM | POA: Diagnosis not present

## 2020-05-25 DIAGNOSIS — O3433 Maternal care for cervical incompetence, third trimester: Secondary | ICD-10-CM | POA: Diagnosis not present

## 2020-05-25 DIAGNOSIS — U071 COVID-19: Secondary | ICD-10-CM

## 2020-05-25 DIAGNOSIS — O26893 Other specified pregnancy related conditions, third trimester: Secondary | ICD-10-CM | POA: Diagnosis present

## 2020-05-25 DIAGNOSIS — N179 Acute kidney failure, unspecified: Secondary | ICD-10-CM | POA: Diagnosis not present

## 2020-05-25 DIAGNOSIS — O99214 Obesity complicating childbirth: Secondary | ICD-10-CM | POA: Diagnosis not present

## 2020-05-25 DIAGNOSIS — R7989 Other specified abnormal findings of blood chemistry: Secondary | ICD-10-CM | POA: Diagnosis not present

## 2020-05-25 DIAGNOSIS — D62 Acute posthemorrhagic anemia: Secondary | ICD-10-CM | POA: Diagnosis not present

## 2020-05-25 DIAGNOSIS — Z3A35 35 weeks gestation of pregnancy: Secondary | ICD-10-CM | POA: Diagnosis not present

## 2020-05-25 DIAGNOSIS — O26833 Pregnancy related renal disease, third trimester: Secondary | ICD-10-CM | POA: Diagnosis not present

## 2020-05-25 DIAGNOSIS — J9601 Acute respiratory failure with hypoxia: Secondary | ICD-10-CM

## 2020-05-25 DIAGNOSIS — Z6791 Unspecified blood type, Rh negative: Secondary | ICD-10-CM

## 2020-05-25 DIAGNOSIS — O1424 HELLP syndrome, complicating childbirth: Secondary | ICD-10-CM | POA: Diagnosis present

## 2020-05-25 DIAGNOSIS — O9952 Diseases of the respiratory system complicating childbirth: Secondary | ICD-10-CM | POA: Diagnosis present

## 2020-05-25 DIAGNOSIS — O9852 Other viral diseases complicating childbirth: Secondary | ICD-10-CM | POA: Diagnosis not present

## 2020-05-25 DIAGNOSIS — R945 Abnormal results of liver function studies: Secondary | ICD-10-CM | POA: Diagnosis not present

## 2020-05-25 DIAGNOSIS — R069 Unspecified abnormalities of breathing: Secondary | ICD-10-CM | POA: Diagnosis not present

## 2020-05-25 DIAGNOSIS — Z3483 Encounter for supervision of other normal pregnancy, third trimester: Secondary | ICD-10-CM

## 2020-05-25 DIAGNOSIS — O3430 Maternal care for cervical incompetence, unspecified trimester: Secondary | ICD-10-CM | POA: Diagnosis not present

## 2020-05-25 DIAGNOSIS — R0602 Shortness of breath: Secondary | ICD-10-CM

## 2020-05-25 DIAGNOSIS — O9081 Anemia of the puerperium: Secondary | ICD-10-CM | POA: Diagnosis not present

## 2020-05-25 DIAGNOSIS — R7401 Elevation of levels of liver transaminase levels: Secondary | ICD-10-CM | POA: Diagnosis present

## 2020-05-25 DIAGNOSIS — J189 Pneumonia, unspecified organism: Secondary | ICD-10-CM | POA: Diagnosis not present

## 2020-05-25 LAB — COMPREHENSIVE METABOLIC PANEL
ALT: 146 U/L — ABNORMAL HIGH (ref 0–44)
AST: 542 U/L — ABNORMAL HIGH (ref 15–41)
Albumin: 2 g/dL — ABNORMAL LOW (ref 3.5–5.0)
Alkaline Phosphatase: 190 U/L — ABNORMAL HIGH (ref 38–126)
Anion gap: 12 (ref 5–15)
BUN: 9 mg/dL (ref 6–20)
CO2: 19 mmol/L — ABNORMAL LOW (ref 22–32)
Calcium: 7.8 mg/dL — ABNORMAL LOW (ref 8.9–10.3)
Chloride: 108 mmol/L (ref 98–111)
Creatinine, Ser: 1.46 mg/dL — ABNORMAL HIGH (ref 0.44–1.00)
GFR calc Af Amer: 51 mL/min — ABNORMAL LOW (ref >60)
GFR calc non Af Amer: 44 mL/min — ABNORMAL LOW (ref >60)
Glucose, Bld: 102 mg/dL — ABNORMAL HIGH (ref 70–99)
Potassium: 4.3 mmol/L (ref 3.5–5.1)
Sodium: 139 mmol/L (ref 135–145)
Total Bilirubin: 0.8 mg/dL (ref 0.3–1.2)
Total Protein: 5.5 g/dL — ABNORMAL LOW (ref 6.5–8.1)

## 2020-05-25 LAB — CBC WITH DIFFERENTIAL/PLATELET
Abs Immature Granulocytes: 0 10*3/uL (ref 0.00–0.07)
Basophils Absolute: 0.1 10*3/uL (ref 0.0–0.1)
Basophils Relative: 1 %
Eosinophils Absolute: 0 10*3/uL (ref 0.0–0.5)
Eosinophils Relative: 0 %
HCT: 41.2 % (ref 36.0–46.0)
Hemoglobin: 12.9 g/dL (ref 12.0–15.0)
Lymphocytes Relative: 7 %
Lymphs Abs: 0.5 10*3/uL — ABNORMAL LOW (ref 0.7–4.0)
MCH: 28 pg (ref 26.0–34.0)
MCHC: 31.3 g/dL (ref 30.0–36.0)
MCV: 89.6 fL (ref 80.0–100.0)
Monocytes Absolute: 0.4 10*3/uL (ref 0.1–1.0)
Monocytes Relative: 5 %
Neutro Abs: 6.2 10*3/uL (ref 1.7–7.7)
Neutrophils Relative %: 87 %
Platelets: 147 10*3/uL — ABNORMAL LOW (ref 150–400)
RBC: 4.6 MIL/uL (ref 3.87–5.11)
RDW: 14 % (ref 11.5–15.5)
WBC: 7.1 10*3/uL (ref 4.0–10.5)
nRBC: 0 /100 WBC
nRBC: 0.3 % — ABNORMAL HIGH (ref 0.0–0.2)

## 2020-05-25 LAB — GLUCOSE, CAPILLARY: Glucose-Capillary: 105 mg/dL — ABNORMAL HIGH (ref 70–99)

## 2020-05-25 LAB — C-REACTIVE PROTEIN: CRP: 14.5 mg/dL — ABNORMAL HIGH (ref <1.0)

## 2020-05-25 LAB — PROCALCITONIN: Procalcitonin: 0.83 ng/mL

## 2020-05-25 LAB — FIBRINOGEN: Fibrinogen: 536 mg/dL — ABNORMAL HIGH (ref 210–475)

## 2020-05-25 LAB — D-DIMER, QUANTITATIVE: D-Dimer, Quant: 1.56 ug/mL-FEU — ABNORMAL HIGH (ref 0.00–0.50)

## 2020-05-25 LAB — LACTATE DEHYDROGENASE: LDH: 708 U/L — ABNORMAL HIGH (ref 98–192)

## 2020-05-25 LAB — TROPONIN I (HIGH SENSITIVITY): Troponin I (High Sensitivity): 7 ng/L (ref <18)

## 2020-05-25 LAB — FERRITIN: Ferritin: 976 ng/mL — ABNORMAL HIGH (ref 11–307)

## 2020-05-25 MED ORDER — ALBUTEROL SULFATE HFA 108 (90 BASE) MCG/ACT IN AERS
2.0000 | INHALATION_SPRAY | RESPIRATORY_TRACT | Status: DC | PRN
  Administered 2020-05-25: 2 via RESPIRATORY_TRACT
  Filled 2020-05-25: qty 6.7

## 2020-05-25 MED ORDER — ENOXAPARIN SODIUM 60 MG/0.6ML ~~LOC~~ SOLN: 50.0000 mg | SUBCUTANEOUS | Status: DC

## 2020-05-25 MED ORDER — PRENATAL MULTIVITAMIN CH
1.0000 | ORAL_TABLET | Freq: Every day | ORAL | Status: DC
  Administered 2020-05-27 – 2020-06-04 (×8): 1 via ORAL
  Filled 2020-05-25 (×8): qty 1

## 2020-05-25 MED ORDER — ACETAMINOPHEN 325 MG PO TABS
650.0000 mg | ORAL_TABLET | Freq: Four times a day (QID) | ORAL | Status: DC | PRN
  Administered 2020-06-02: 650 mg via ORAL
  Filled 2020-05-25 (×2): qty 2

## 2020-05-25 MED ORDER — ONDANSETRON HCL 4 MG PO TABS: 4.0000 mg | ORAL_TABLET | Freq: Four times a day (QID) | ORAL | Status: DC | PRN

## 2020-05-25 MED ORDER — FLEET ENEMA 7-19 GM/118ML RE ENEM: 1.0000 | ENEMA | Freq: Once | RECTAL | Status: DC | PRN

## 2020-05-25 MED ORDER — SODIUM CHLORIDE 0.9% FLUSH: 3.0000 mL | INTRAVENOUS | Status: DC | PRN

## 2020-05-25 MED ORDER — HYDROCOD POLST-CPM POLST ER 10-8 MG/5ML PO SUER: 5.0000 mL | Freq: Two times a day (BID) | ORAL | Status: DC | PRN

## 2020-05-25 MED ORDER — ASCORBIC ACID 500 MG PO TABS
500.0000 mg | ORAL_TABLET | Freq: Every day | ORAL | Status: DC
  Administered 2020-05-25 – 2020-06-05 (×11): 500 mg via ORAL
  Filled 2020-05-25 (×11): qty 1

## 2020-05-25 MED ORDER — DOCUSATE SODIUM 100 MG PO CAPS: 100.0000 mg | ORAL_CAPSULE | Freq: Every day | ORAL | Status: DC

## 2020-05-25 MED ORDER — LACTATED RINGERS IV SOLN: INTRAVENOUS | Status: DC

## 2020-05-25 MED ORDER — OXYCODONE HCL 5 MG PO TABS: 5.0000 mg | ORAL_TABLET | ORAL | Status: DC | PRN

## 2020-05-25 MED ORDER — BISACODYL 5 MG PO TBEC: 5.0000 mg | DELAYED_RELEASE_TABLET | Freq: Every day | ORAL | Status: DC | PRN

## 2020-05-25 MED ORDER — SODIUM CHLORIDE 0.9% FLUSH
3.0000 mL | Freq: Two times a day (BID) | INTRAVENOUS | Status: DC
  Administered 2020-05-28 – 2020-06-05 (×14): 3 mL via INTRAVENOUS

## 2020-05-25 MED ORDER — SODIUM CHLORIDE 0.9 % IV SOLN: 250.0000 mL | INTRAVENOUS | Status: DC | PRN

## 2020-05-25 MED ORDER — INSULIN ASPART 100 UNIT/ML ~~LOC~~ SOLN
0.0000 [IU] | Freq: Every day | SUBCUTANEOUS | Status: DC
  Administered 2020-05-27: 2 [IU] via SUBCUTANEOUS

## 2020-05-25 MED ORDER — SODIUM CHLORIDE 0.9 % IV SOLN
100.0000 mg | Freq: Every day | INTRAVENOUS | Status: AC
  Administered 2020-05-26 – 2020-05-29 (×4): 100 mg via INTRAVENOUS
  Filled 2020-05-25: qty 20
  Filled 2020-05-25: qty 100
  Filled 2020-05-25 (×5): qty 20

## 2020-05-25 MED ORDER — ONDANSETRON HCL 4 MG/2ML IJ SOLN: 4.0000 mg | Freq: Four times a day (QID) | INTRAMUSCULAR | Status: DC | PRN

## 2020-05-25 MED ORDER — HEPARIN SODIUM (PORCINE) 5000 UNIT/ML IJ SOLN
5000.0000 [IU] | Freq: Two times a day (BID) | INTRAMUSCULAR | Status: DC
  Administered 2020-05-25: 5000 [IU] via SUBCUTANEOUS
  Filled 2020-05-25: qty 1

## 2020-05-25 MED ORDER — ZINC SULFATE 220 (50 ZN) MG PO CAPS
220.0000 mg | ORAL_CAPSULE | Freq: Every day | ORAL | Status: DC
  Administered 2020-05-25 – 2020-06-05 (×11): 220 mg via ORAL
  Filled 2020-05-25 (×12): qty 1

## 2020-05-25 MED ORDER — POLYETHYLENE GLYCOL 3350 17 G PO PACK: 17.0000 g | PACK | Freq: Every day | ORAL | Status: DC | PRN

## 2020-05-25 MED ORDER — INSULIN ASPART 100 UNIT/ML ~~LOC~~ SOLN
0.0000 [IU] | Freq: Three times a day (TID) | SUBCUTANEOUS | Status: DC
  Administered 2020-05-26: 3 [IU] via SUBCUTANEOUS
  Administered 2020-05-27 – 2020-05-28 (×3): 2 [IU] via SUBCUTANEOUS

## 2020-05-25 MED ORDER — GUAIFENESIN-DM 100-10 MG/5ML PO SYRP
10.0000 mL | ORAL_SOLUTION | ORAL | Status: DC | PRN
  Administered 2020-05-25 – 2020-06-04 (×6): 10 mL via ORAL
  Filled 2020-05-25 (×7): qty 10

## 2020-05-25 MED ORDER — CALCIUM CARBONATE ANTACID 500 MG PO CHEW
2.0000 | CHEWABLE_TABLET | ORAL | Status: DC | PRN
  Administered 2020-06-05: 400 mg via ORAL
  Filled 2020-05-25: qty 2

## 2020-05-25 MED ORDER — SODIUM CHLORIDE 0.9 % IV SOLN
200.0000 mg | Freq: Once | INTRAVENOUS | Status: AC
  Administered 2020-05-25: 200 mg via INTRAVENOUS
  Filled 2020-05-25: qty 40

## 2020-05-25 MED ORDER — PREDNISONE 20 MG PO TABS: 50.0000 mg | ORAL_TABLET | Freq: Every day | ORAL | Status: DC

## 2020-05-25 MED ORDER — METHYLPREDNISOLONE SODIUM SUCC 125 MG IJ SOLR
0.5000 mg/kg | Freq: Two times a day (BID) | INTRAMUSCULAR | Status: DC
  Administered 2020-05-25 – 2020-05-26 (×2): 53.125 mg via INTRAVENOUS
  Filled 2020-05-25 (×2): qty 2

## 2020-05-25 MED ORDER — ACETAMINOPHEN 325 MG PO TABS: 650.0000 mg | ORAL_TABLET | ORAL | Status: DC | PRN

## 2020-05-25 NOTE — Progress Notes (Deleted)
Did not see patient.

## 2020-05-25 NOTE — H&P (Signed)
HPI: 39 y/o G2P1001 @ [redacted]w[redacted]d estimated gestational age (as dated by LMP c/w 20 week ultrasound) admitted for COVID pneumonia. Notes symptoms have gotten progressively worse over the past several days.  States that when she starts coughing she has trouble catching her breath.  She also notes abdominal tenderness/discomfort from all the coughing.  No nausea/vomiting.  Last meal yesterday.  Decreased appetite.   no Leaking of Fluid,   no Vaginal Bleeding,   no Uterine Contractions,  + Fetal Movement.  Prenatal care has been provided by Dr. Charlotta Newton  Pregnancy complicated by: 1) AMA- normal genetic screening 2) Cerclage placed 02/19/20 due to short cervix 3) h/o cervical laceration in prior pregnancy complicated by PPH, DIC requiring Uterine a. 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   Prenatal Transfer Tool  Maternal Diabetes: No Genetic Screening: Normal Maternal Ultrasounds/Referrals: Normal Fetal Ultrasounds or other Referrals:  Referred to Materal Fetal Medicine  Maternal Substance Abuse:  No Significant Maternal Medications:  None Significant Maternal Lab Results: Rh negative   PNL:  GBS unknown, Rub Immune, Hep B neg, RPR NR, HIV neg, GC/C neg, glucola:113 Hgb: 11.4 Blood type: O negative  Immunizations: Tdap: 8/18  OBHx: 02/25/2019- NSVD with complications- PPH due to cervical laceration-taken to the OR for repair and ultimately [place] PMHx:  obesity Meds:  PNV Allergy:  No Known Allergies SurgHx: gastric bypass, cervical laceration repair SocHx:   no Tobacco, no  EtOH, no Illicit Drugs  O: BP 102/60   Pulse 90   Temp 98 F (36.7 C) (Oral)   Resp (!) 40   LMP 09/20/2019 (Exact Date)   SpO2 98%  Gen. Appears ill and fatigued CV. RRR Resp. Shallow breathes noted Abd. Gravid,  Mild diffuse tenderness,  no rigidity,  no guarding, no RUQ  pain Extr.  no edema B/L , no calf tenderness, neg Homan's B/L FHT: 130 baseline, moderate variability, + accels,  no decels GU: Deferred  Labs:  Results for orders placed or performed during the hospital encounter of 05/25/20 (from the past 24 hour(s))  CBC with Differential/Platelet     Status: Abnormal   Collection Time: 05/25/20  2:18 PM  Result Value Ref Range   WBC 7.1 4.0 - 10.5 K/uL   RBC 4.60 3.87 - 5.11 MIL/uL   Hemoglobin 12.9 12.0 - 15.0 g/dL   HCT 40.9 36 - 46 %   MCV 89.6 80.0 - 100.0 fL   MCH 28.0 26.0 - 34.0 pg   MCHC 31.3 30.0 - 36.0 g/dL   RDW 81.1 91.4 - 78.2 %   Platelets 147 (L) 150 - 400 K/uL   nRBC 0.3 (H) 0.0 - 0.2 %   Neutrophils Relative % 87 %   Neutro Abs 6.2 1.7 - 7.7 K/uL   Lymphocytes Relative 7 %   Lymphs Abs 0.5 (L) 0.7 - 4.0 K/uL   Monocytes Relative 5 %   Monocytes Absolute 0.4 0 - 1 K/uL   Eosinophils Relative 0 %   Eosinophils Absolute 0.0 0 - 0 K/uL   Basophils Relative 1 %   Basophils Absolute 0.1 0 - 0 K/uL   WBC Morphology See Note    nRBC 0 0 /100 WBC   Abs Immature Granulocytes 0.00 0.00 - 0.07 K/uL  Comprehensive metabolic panel     Status: Abnormal   Collection Time: 05/25/20  2:18 PM  Result Value Ref  Range   Sodium 139 135 - 145 mmol/L   Potassium 4.3 3.5 - 5.1 mmol/L   Chloride 108 98 - 111 mmol/L   CO2 19 (L) 22 - 32 mmol/L   Glucose, Bld 102 (H) 70 - 99 mg/dL   BUN 9 6 - 20 mg/dL   Creatinine, Ser 2.77 (H) 0.44 - 1.00 mg/dL   Calcium 7.8 (L) 8.9 - 10.3 mg/dL   Total Protein 5.5 (L) 6.5 - 8.1 g/dL   Albumin 2.0 (L) 3.5 - 5.0 g/dL   AST 824 (H) 15 - 41 U/L   ALT 146 (H) 0 - 44 U/L   Alkaline Phosphatase 190 (H) 38 - 126 U/L   Total Bilirubin 0.8 0.3 - 1.2 mg/dL   GFR calc non Af Amer 44 (L) >60 mL/min   GFR calc Af Amer 51 (L) >60 mL/min   Anion gap 12 5 - 15  Troponin I (High Sensitivity)     Status: None   Collection Time: 05/25/20  2:18 PM  Result Value Ref Range   Troponin I (High Sensitivity) 7 <18 ng/L   C-reactive protein     Status: Abnormal   Collection Time: 05/25/20  2:18 PM  Result Value Ref Range   CRP 14.5 (H) <1.0 mg/dL  D-dimer, quantitative (not at [facility])     Status: Abnormal   Collection Time: 05/25/20  2:18 PM  Result Value Ref Range   D-Dimer, Quant 1.56 (H) 0.00 - 0.50 ug/mL-FEU  Fibrinogen     Status: Abnormal   Collection Time: 05/25/20  2:18 PM  Result Value Ref Range   Fibrinogen 536 (H) 210 - 475 mg/dL  Ferritin     Status: Abnormal   Collection Time: 05/25/20  2:18 PM  Result Value Ref Range   Ferritin 976 (H) 11 - 307 ng/mL  Procalcitonin - Baseline     Status: None   Collection Time: 05/25/20  2:18 PM  Result Value Ref Range   Procalcitonin 0.83 ng/mL  Type and screen  [facility]     Status: None (Preliminary result)   Collection Time: 05/25/20  2:18 PM  Result Value Ref Range   ABO/RH(D) O NEG    Antibody Screen POS    Sample Expiration 05/28/2020,2359    Antibody Identification ANTI C ANTI E PASSIVELY ACQUIRED ANTI-D    Unit Number [id number]    Blood Component Type RED CELLS,LR    Unit division 00    Status of Unit ALLOCATED    Donor AG Type NEGATIVE FOR C ANTIGEN NEGATIVE FOR E ANTIGEN    Transfusion Status OK TO TRANSFUSE    Crossmatch Result COMPATIBLE    Unit Number [id number]    Blood Component Type RED CELLS,LR    Unit division 00    Status of Unit ALLOCATED    Donor AG Type NEGATIVE FOR C ANTIGEN NEGATIVE FOR E ANTIGEN    Transfusion Status OK TO TRANSFUSE    Crossmatch Result COMPATIBLE   Lactate dehydrogenase     Status: Abnormal   Collection Time: 05/25/20  2:18 PM  Result Value Ref Range   LDH 708 (H) 98 - 192 U/L     A/P:  39 y.o. G2P1001 @ [redacted]w[redacted]d EGA who presents admitted for COVID pneumonia -COVID pneumonia  Management per Hospitalist team -FWB: Reassuring- Cat. I- currently on continuous monitoring -Elevated LFTs noted- reviewed with MFM- suspect COVID related as opposed to HELLP- will continue to  monitor daily -Discussed with patient possibility for early  delivery- due to prior complications- plan for primary C-section.  Risk/benefits were reviewed with patient -advance diet as tolerated -bedrest with bathroom privileges  Myna Hidalgo, DO [phone] (cell) [phone] (office)

## 2020-05-25 NOTE — MAU Note (Signed)
MD called earlier this morning.  Known +Covid, w/ worsening symptoms

## 2020-05-25 NOTE — MAU Provider Note (Addendum)
Chief Complaint:  worsening resp symptoms   First Provider Initiated Contact with Patient 05/25/20 1305     HPI: Kristy Hines is a 39 y.o. G2P1001 at [redacted]w[redacted]d who was sent to MAU by her community practice OB (Dr. Charlotta Newton). She was diagnosed with Covid on 05/23/20 and is now very SOB, weak, coughing, and achy all over. Dr. Charlotta Newton recommended she come to MAU asap (at 0830 this morning) for evaluation and antibody infusion, pt arrived via EMS at 1245.   Pregnancy Course: Cerclage  Past Medical History:  Diagnosis Date   Blood transfusion without reported diagnosis    Gallstones    Multiple thyroid nodules    Postpartum hemorrhage    >4L blood loss DIC   OB History  Gravida Para Term Preterm AB Living  2 1 1     1   SAB TAB Ectopic Multiple Live Births        0 1    # Outcome Date GA Lbr Len/2nd Weight Sex Delivery Anes PTL Lv  2 Current           1 Term 02/28/19 [redacted]w[redacted]d 03:08 / 02:36 7 lb 11.6 oz (3.505 kg) F Vag-Vacuum EPI  LIV   Past Surgical History:  Procedure Laterality Date   CERVICAL CERCLAGE N/A 02/19/2020   Procedure: CERCLAGE CERVICAL;  Surgeon: 02/21/2020, MD;  Location: [facility];  Service: Gynecology;  Laterality: N/A;   CHOLECYSTECTOMY N/A 04/21/2018   Procedure: LAPAROSCOPIC CHOLECYSTECTOMY;  Surgeon: 04/23/2018, MD;  Location[facility] OR;  Service: General;  Laterality: N/A;   ERCP Left 04/21/2018   Procedure: ENDOSCOPIC RETROGRADE CHOLANGIOPANCREATOGRAPHY (ERCP);  Surgeon: 04/23/2018, MD;  Location: [facility] ENDOSCOPY;  Service: Gastroenterology;  Laterality: Left;   IR ANGIOGRAM PELVIS SELECTIVE OR SUPRASELECTIVE  02/28/2019   IR ANGIOGRAM PELVIS SELECTIVE OR SUPRASELECTIVE  02/28/2019   IR ANGIOGRAM SELECTIVE EACH ADDITIONAL VESSEL  02/28/2019   IR ANGIOGRAM SELECTIVE EACH ADDITIONAL VESSEL  02/28/2019   IR EMBO ART  VEN HEMORR LYMPH EXTRAV  INC GUIDE ROADMAPPING  02/28/2019   IR 03/02/2019 GUIDE VASC ACCESS LEFT  02/28/2019   IR 03/02/2019 GUIDE VASC ACCESS RIGHT  02/28/2019    LAPAROSCOPIC GASTRIC SLEEVE RESECTION  07/2017   PERINEAL LACERATION REPAIR N/A 02/28/2019   Procedure: SUTURE REPAIR PERINEAL LACERATION;  Surgeon: 03/02/2019, DO;  Location: [facility];  Service: Gynecology;  Laterality: N/A;   REMOVAL OF STONES  04/21/2018   Procedure: REMOVAL OF STONES;  Surgeon: 04/23/2018, MD;  Location: [facility] ENDOSCOPY;  Service: Gastroenterology;;   [facility]  04/21/2018   Procedure: SPHINCTEROTOMY;  Surgeon: 04/23/2018, MD;  Location: [facility] ENDOSCOPY;  Service: Gastroenterology;;   Family History  Problem Relation Age of Onset   Heart disease Father    Hypertension Father    Hyperlipidemia Father    Cancer Maternal Grandfather    Hypertension Mother    Leukemia Maternal Aunt    Social History   Tobacco Use   Smoking status: Never Smoker   Smokeless tobacco: Never Used  Vaping Use   Vaping Use: Never used  Substance Use Topics   Alcohol use: Not Currently   Drug use: Never   No Known Allergies Medications Prior to Admission  Medication Sig Dispense Refill Last Dose   acetaminophen (TYLENOL) 325 MG tablet Take 2 tablets (650 mg total) by mouth every 4 (four) hours as needed for moderate pain (for pain scale < 4). (Patient not taking: Reported on 02/01/2020) 30 tablet 3  docusate sodium (COLACE) 100 MG capsule Take 1 capsule (100 mg total) by mouth 2 (two) times daily. (Patient not taking: Reported on 02/01/2020) 60 capsule 1    ferrous gluconate (FERGON) 324 MG tablet Take 1 tablet (324 mg total) by mouth 2 (two) times daily with a meal. (Patient not taking: Reported on 02/01/2020) 60 tablet 3    Multiple Vitamin (MULTIVITAMIN) tablet Take 1 tablet by mouth daily.      PROGESTERONE [facility] vaginally.       I have reviewed patient's Past Medical Hx, Surgical Hx, Family Hx, Social Hx, medications and allergies.   ROS:  Review of Systems  Constitutional: Positive for activity change (very weak, cannot get up to make food, etc), fatigue  and fever.  HENT: Positive for sore throat.   Respiratory: Positive for cough, chest tightness and shortness of breath.   Gastrointestinal: Negative for nausea and vomiting.  Genitourinary: Negative for vaginal bleeding and vaginal discharge.  Musculoskeletal: Positive for arthralgias and myalgias.  Neurological: Positive for weakness.    Physical Exam   Patient Vitals for the past 24 hrs:  BP Temp Temp src Pulse Resp SpO2  05/25/20 1430 -- -- -- -- -- 96 %  05/25/20 1428 -- -- -- -- -- 94 %  05/25/20 1420 -- -- -- -- -- 100 %  05/25/20 1415 -- -- -- -- -- 97 %  05/25/20 1410 -- -- -- -- -- 98 %  05/25/20 1408 -- -- -- -- -- 96 %  05/25/20 1400 -- -- -- -- -- 97 %  05/25/20 1355 -- -- -- -- -- 98 %  05/25/20 1345 -- -- -- -- -- 100 %  05/25/20 1340 -- -- -- -- -- 99 %  05/25/20 1335 -- -- -- -- -- 97 %  05/25/20 1325 -- -- -- -- -- 95 %  05/25/20 1320 -- -- -- -- -- 99 %  05/25/20 1315 -- -- -- -- -- 98 %  05/25/20 1308 -- -- -- -- -- 93 %  05/25/20 1305 -- -- -- -- -- 90 %  05/25/20 1250 -- -- -- -- -- 92 %  05/25/20 1246 -- -- -- -- -- (!) 89 %  05/25/20 1242 103/60 98.4 F (36.9 C) Oral 89 (!) 44 (!) 89 %  05/25/20 1240 -- -- -- -- -- (!) 84 %  05/25/20 1239 -- -- -- -- -- (!) 83 %    Constitutional: Well-developed, well-nourished female in mild respiratory distress.  Cardiovascular: normal rate & rhythm, no murmur Respiratory: breathing labored, tachypneic, lung sounds diminished on both sides, moreso on the right GI: Abd soft, non-tender, gravid appropriate for gestational age. Pos BS x 4 MS: Extremities nontender, no edema, normal ROM Neurologic: Alert and oriented x 4.  Pelvic: Deferred    Fetal Tracing: reactive  Baseline: 130 Variability: moderate Accelerations: present Decelerations: absent Toco: relaxed   Labs: Results for orders placed or performed during the hospital encounter of 05/25/20 (from the past 24 hour(s))  CBC with Differential/Platelet      Status: Abnormal (Preliminary result)   Collection Time: 05/25/20  2:18 PM  Result Value Ref Range   WBC 7.1 4.0 - 10.5 K/uL   RBC 4.60 3.87 - 5.11 MIL/uL   Hemoglobin 12.9 12.0 - 15.0 g/dL   HCT 47.4 36 - 46 %   MCV 89.6 80.0 - 100.0 fL   MCH 28.0 26.0 - 34.0 pg   MCHC 31.3 30.0 - 36.0 g/dL  RDW 14.0 11.5 - 15.5 %   Platelets 147 (L) 150 - 400 K/uL   nRBC 0.3 (H) 0.0 - 0.2 %   Neutrophils Relative % PENDING %   Neutro Abs PENDING 1.7 - 7.7 K/uL   Band Neutrophils PENDING %   Lymphocytes Relative PENDING %   Lymphs Abs PENDING 0.7 - 4.0 K/uL   Monocytes Relative PENDING %   Monocytes Absolute PENDING 0 - 1 K/uL   Eosinophils Relative PENDING %   Eosinophils Absolute PENDING 0 - 0 K/uL   Basophils Relative PENDING %   Basophils Absolute PENDING 0 - 0 K/uL   WBC Morphology PENDING    RBC Morphology PENDING    Smear Review PENDING    Other PENDING %   nRBC PENDING 0 /100 WBC   Metamyelocytes Relative PENDING %   Myelocytes PENDING %   Promyelocytes Relative PENDING %   Blasts PENDING %   Immature Granulocytes PENDING %   Abs Immature Granulocytes PENDING 0.00 - 0.07 K/uL    Imaging:  DG CHEST PORT 1 VIEW  Result Date: 05/25/2020 CLINICAL DATA:  Shortness of breath.  COVID-19 positive EXAM: PORTABLE CHEST 1 VIEW COMPARISON:  April 17, 2018 FINDINGS: There is airspace opacity consistent with pneumonia in the right lower lung region. Lungs elsewhere clear. Heart size and pulmonary vascularity are normal. No adenopathy. No bone lesions. IMPRESSION: Airspace opacity right base consistent with pneumonia. COVID-19 pneumonia potentially could present in this manner, although bacterial pneumonia could present similarly. Lungs elsewhere clear. Cardiac silhouette normal. No adenopathy. Electronically Signed   By: Bretta Bang III M.D.   On: 05/25/2020 13:24    MAU Course: Orders Placed This Encounter  Procedures   DG CHEST PORT 1 VIEW   CBC with Differential/Platelet    Comprehensive metabolic panel   C-reactive protein   D-dimer, quantitative (not at [facility])   Fibrinogen   Ferritin   Procalcitonin - Baseline   Lactate dehydrogenase   Notify physician (specify)   Vital signs   Defer vaginal exam for vaginal bleeding or PROM <37 weeks   Initiate Oral Care Protocol   Initiate Carrier Fluid Protocol   SCDs   Fetal monitoring   Bed rest with bathroom privileges   Full code   Airborne and Contact precautions   Oxygen therapy Mode or (Route): Non-rebreather; Liters Per Minute: 10   ED EKG   Type and screen [facility]   Meds ordered this encounter  Medications   acetaminophen (TYLENOL) tablet 650 mg   docusate sodium (COLACE) capsule 100 mg   calcium carbonate (TUMS - dosed in mg elemental calcium) chewable tablet 400 mg of elemental calcium   prenatal multivitamin tablet 1 tablet    MDM: Pt on 6L when she arrived to EMS, desatted to low 80s. RN titrated to 10L w NRB mask. Consulted with Dr. Earlene Plater prior to calling [facility]. Dr. Charlotta Newton placed admit orders PCCM to come determine admission in [facility] vs ICU - recommended against ICU admission at this time, pt currently stable for OBSC  Assessment: 1. Shortness of breath at rest    Plan: Admit to [facility] for Covid management    Edd Arbour, CNM, MSN, [facility] 05/25/20 2:43 PM

## 2020-05-25 NOTE — Progress Notes (Signed)
PCCM Interval Progress Note:  Pt rounded on this evening, she is resting on 5L [place] with oxygen sats of 95-97% and is in no respiratory distress.  She is speaking in full sentences and denies chest pain or dyspnea.  Spoke with RN, she does desaturate with ambulation, but recovers with rest.   -Pt is stable to stay at the current level of care at this time.  -Formal consult done earlier today PCCM will continue to follow respiratory status and renal/hepatic function pending labs in the AM.   Please contact PCCM for any concerns overnight.   Darcella Gasman Alixandria Friedt, PA-C

## 2020-05-25 NOTE — Progress Notes (Signed)
NAME:  Kristy Hines, MRN:  [id number], DOB:  10/26/1980, LOS: 0 ADMISSION DATE:  05/25/2020, CONSULTATION DATE:  05/25/20 REFERRING MD:  Ophelia Charter - TRH, CHIEF COMPLAINT:  SOB, cough  Brief History   [name] w SOB hypoxia -- COVID-19 PNA. Escalating O2 needs In context of adv maternal age pregnancy + increasing O2 demand, PCCM consulted  History of present illness   39 yo F 35wk + 3 day pregnant presents to Maternal ED with CC SOB. Pt diagnosed with covid 2 days prior to presentation, not vaccinated for COVID-19. Pt reports that OB had initially recommended monoclonal antibodies for pt but this did not come to fruition. Due to worsening SOB and persistent dry cough, pt presents to ED via EMS. With EMS, SpO2 reportedly 81% on RA. In ED, initially on 6L [place]. Later this was changed to 10L NRB, with SpO2 95-100%, although this is seemingly documented as 10L HFNC + NRB. In setting of advanced maternal age, 34 wk pregnancy, documented increasing O2 requirement, PCCM asked to evaluate for possible ICU admission.   No labs have been obtained at time of consultation.   Past Medical History  Gastric sleeve Gallstones  Significant Hospital Events   9/24 presents to Maternal ED   Consults:  PCCM  Procedures:    Significant Diagnostic Tests:  9/24 CXR> RLL opacity   Micro Data:  SARS CoV2  Antimicrobials:    Interim history/subjective:  Evaluated in MAU  PCCM has changed 10L NRB to [facility] with SpO2 93-96% Speaking in complete sentences, subjectively feels as if cannot catch her breath  Objective   Blood pressure 103/60, pulse 89, temperature 98.4 F (36.9 C), temperature source Oral, resp. rate (!) 44, last menstrual period 09/20/2019, SpO2 100 %, unknown if currently breastfeeding.    >       No intake or output data in the 24 hours ending 05/25/20 1404 There were no vitals filed for this visit.  Examination: General: Middle aged pregnant F, supine in  bed mild discomfort, no respiratory distress HENT: NCAT pink mmm trachea midline  Lungs: Symmetrical chest expansion, shallow unlabored respirations, increased respiratory rate Cardiovascular: RRR. Well perfused distal extremities  Abdomen: Pregnant Extremities: BLE edema. No obvious joint deformity  Neuro: AAOx4, following commands GU: defer    Resolved Hospital Problem list     Assessment & Plan:  Acute hypoxic resp failure:  -oxygen on RA when EMS arrived low 80's -improved on 5-6L... sating mid to high 90's on this upon our exam.  -pt was on NRB with flow meter at 10L -we have asked the RN to place her on humified oxygen -change to HFNC as well  -encourage deep breathing with IS -cxr prn for change in oxygen requirement.  COVID pneumonia -titrate oxygen to sats >88 for typical pt -defer to [facility] team for appropriate sat in regards to gravid pt -typically recommend chemoprophy as covid pt have higher propensity to be hypercoagulable.  -also recommend remdesivir x5 days with decadron 6mg  x 10day -due to her inpatient need and symptoms along with oxygen requirement she would not likely meet criteria for regen-cov but defer to OB if they prefer this -additionally for many we offer actemera or baricitinib in conjunction with above in her condition with worsening course  -certainly can follow markers including crp/fibrinogen/ferritin and ddimer however I am personally unsure how they would be reflected/interpreted in a gravid pt.   -self proning or, in her case, lateral decubitus as  able IUP:  -per ob  Ccm will sign off at this time but please call with any questions.    Best practice:  Diet: per primary Pain/Anxiety/Delirium protocol (if indicated): per primary VAP protocol (if indicated): n/a DVT prophylaxis: per primary.... indicated more so in covid + pt's GI prophylaxis: per primary Glucose control: per primary Mobility: per primary Code Status: FULL Family  Communication: updated pt Disposition: per primary  Labs   CBC: Recent Labs  Lab 05/25/20 1418  WBC 7.1  NEUTROABS PENDING  HGB 12.9  HCT 41.2  MCV 89.6  PLT 147*    Basic Metabolic Panel: No results for input(s): NA, K, CL, CO2, GLUCOSE, BUN, CREATININE, CALCIUM, MG, PHOS in the last 168 hours. GFR: CrCl cannot be calculated (Patient's most recent lab result is older than the maximum 21 days allowed.). Recent Labs  Lab 05/25/20 1418  WBC 7.1    Liver Function Tests: No results for input(s): AST, ALT, ALKPHOS, BILITOT, PROT, ALBUMIN in the last 168 hours. No results for input(s): LIPASE, AMYLASE in the last 168 hours. No results for input(s): AMMONIA in the last 168 hours.  ABG    Component Value Date/Time   HCO3 21.2 02/28/2019 1831   TCO2 22 02/28/2019 1831   ACIDBASEDEF 4.0 (H) 02/28/2019 1831   O2SAT 26.0 02/28/2019 1831     Coagulation Profile: No results for input(s): INR, PROTIME in the last 168 hours.  Cardiac Enzymes: No results for input(s): CKTOTAL, CKMB, CKMBINDEX, TROPONINI in the last 168 hours.  HbA1C: No results found for: HGBA1C  CBG: No results for input(s): GLUCAP in the last 168 hours.  Review of Systems:   Bed hurts, back hurts, poor po intake/appetite/diarrhea  Past Medical History  She,  has a past medical history of Blood transfusion without reported diagnosis, Gallstones, Multiple thyroid nodules, and Postpartum hemorrhage.   Surgical History    Past Surgical History:  Procedure Laterality Date  . CERVICAL CERCLAGE N/A 02/19/2020   Procedure: CERCLAGE CERVICAL;  Surgeon: Noralee Space, MD;  Location: [facility] ORS;  Service: Gynecology;  Laterality: N/A;  . CHOLECYSTECTOMY N/A 04/21/2018   Procedure: LAPAROSCOPIC CHOLECYSTECTOMY;  Surgeon: Jimmye Norman, MD;  Location: [facility] OR;  Service: General;  Laterality: N/A;  . ERCP Left 04/21/2018   Procedure: ENDOSCOPIC RETROGRADE CHOLANGIOPANCREATOGRAPHY (ERCP);  Surgeon: Kerin Salen, MD;   Location: [facility] ENDOSCOPY;  Service: Gastroenterology;  Laterality: Left;  . IR ANGIOGRAM PELVIS SELECTIVE OR SUPRASELECTIVE  02/28/2019  . IR ANGIOGRAM PELVIS SELECTIVE OR SUPRASELECTIVE  02/28/2019  . IR ANGIOGRAM SELECTIVE EACH ADDITIONAL VESSEL  02/28/2019  . IR ANGIOGRAM SELECTIVE EACH ADDITIONAL VESSEL  02/28/2019  . IR EMBO ART  VEN HEMORR LYMPH EXTRAV  INC GUIDE ROADMAPPING  02/28/2019  . IR US GUIDE VASC ACCESS LEFT  02/28/2019  . IR US GUIDE VASC ACCESS RIGHT  02/28/2019  . LAPAROSCOPIC GASTRIC SLEEVE RESECTION  07/2017  . PERINEAL LACERATION REPAIR N/A 02/28/2019   Procedure: SUTURE REPAIR PERINEAL LACERATION;  Surgeon: Myna Hidalgo, DO;  Location: [facility];  Service: Gynecology;  Laterality: N/A;  . REMOVAL OF STONES  04/21/2018   Procedure: REMOVAL OF STONES;  Surgeon: Kerin Salen, MD;  Location: [facility] ENDOSCOPY;  Service: Gastroenterology;;  . Dennison Mascot  04/21/2018   Procedure: SPHINCTEROTOMY;  Surgeon: Kerin Salen, MD;  Location: [facility] ENDOSCOPY;  Service: Gastroenterology;;     Social History   reports that she has never smoked. She has never used smokeless tobacco. She reports previous alcohol use. She reports that  she does not use drugs.   Family History   Her family history includes Cancer in her maternal grandfather; Heart disease in her father; Hyperlipidemia in her father; Hypertension in her father and mother; Leukemia in her maternal aunt.   Allergies No Known Allergies   Home Medications  Prior to Admission medications   Medication Sig Start Date End Date Taking? Authorizing Provider  acetaminophen (TYLENOL) 325 MG tablet Take 2 tablets (650 mg total) by mouth every 4 (four) hours as needed for moderate pain (for pain scale < 4). Patient not taking: Reported on 02/01/2020 03/03/19   Myna Hidalgo, DO  docusate sodium (COLACE) 100 MG capsule Take 1 capsule (100 mg total) by mouth 2 (two) times daily. Patient not taking: Reported on 02/01/2020 03/03/19   Myna Hidalgo, DO    ferrous gluconate (FERGON) 324 MG tablet Take 1 tablet (324 mg total) by mouth 2 (two) times daily with a meal. Patient not taking: Reported on 02/01/2020 03/03/19   Myna Hidalgo, DO  Multiple Vitamin (MULTIVITAMIN) tablet Take 1 tablet by mouth daily.    [provider]  PROGESTERONE [facility] vaginally.    [provider]     care time 35 mins. This represents my time independent of the NP's/PA's/med students/residents time taking care of the pt. This is excluding procedures.     Briant Sites DO [facility] 05/25/2020, 2:45 PM

## 2020-05-25 NOTE — Progress Notes (Signed)
Phone Call from Dr. Noralee Space. He recommends critical care consult in the event that Mrs. Fornwalt decompensates rapidly... Phone call placed to Dr. Warrick Parisian with Critical Care and consult requested. Order placed.

## 2020-05-25 NOTE — H&P (Signed)
Medical Consultation   [name]  WUJ:[id number]  DOB: [birth year]  DOA: 05/25/2020  PCP: Clayborn Heron, MD   Outpatient Specialists: None    Requesting physician: Edd Arbour, CMN/Ozan  Reason for consultation: 35+3, diagnosed with COVID 2 days ago.  Sent by OB this AM, on 6L and now 10L of HFNC with NRB.  CXR with multifocal PNA.    History of Present Illness: Kristy Hines is an 39 y.o. female with term IUP with cerclage placement presenting with worsening COVID symptoms.  She reports that her husband and stepson tested positive for COVID last week.  She and her 1yo daughter were tested last Friday (9/17).  The daughter's test came back but negative but the patient's test came back positive 2 days ago, on 9/22.  She began developing symptoms around that time, with fatigue, cough, myalgias, and SOB.  Her symptoms worsened and so she called her OB, who encouraged her to come in for evaluation.  She arrived on 6L with O2 sats in low 80s and so was reportedly placed on 10L with NRB.  As such, I called PCCM for admission rather than TRH.  However, PCCM found the patient to be on 10L NRB without [place] O2 and was able to wean her to 5L [place] O2 without difficulty.  As such, patient is appropriate for co-management with TRH and OB on the OB floor.    Review of Systems:  ROS As per HPI otherwise 10 point review of systems negative.    Past Medical History: Past Medical History:  Diagnosis Date  . Blood transfusion without reported diagnosis   . Gallstones   . Multiple thyroid nodules   . Postpartum hemorrhage    >4L blood loss DIC    Past Surgical History: Past Surgical History:  Procedure Laterality Date  . CERVICAL CERCLAGE N/A 02/19/2020   Procedure: CERCLAGE CERVICAL;  Surgeon: Noralee Space, MD;  Location: [facility] LD ORS;  Service: Gynecology;  Laterality: N/A;  . CHOLECYSTECTOMY N/A 04/21/2018   Procedure: LAPAROSCOPIC CHOLECYSTECTOMY;  Surgeon: Jimmye Norman,  MD;  Location: [facility] OR;  Service: General;  Laterality: N/A;  . ERCP Left 04/21/2018   Procedure: ENDOSCOPIC RETROGRADE CHOLANGIOPANCREATOGRAPHY (ERCP);  Surgeon: Kerin Salen, MD;  Location: [facility] ENDOSCOPY;  Service: Gastroenterology;  Laterality: Left;  . IR ANGIOGRAM PELVIS SELECTIVE OR SUPRASELECTIVE  02/28/2019  . IR ANGIOGRAM PELVIS SELECTIVE OR SUPRASELECTIVE  02/28/2019  . IR ANGIOGRAM SELECTIVE EACH ADDITIONAL VESSEL  02/28/2019  . IR ANGIOGRAM SELECTIVE EACH ADDITIONAL VESSEL  02/28/2019  . IR EMBO ART  VEN HEMORR LYMPH EXTRAV  INC GUIDE ROADMAPPING  02/28/2019  . IR US GUIDE VASC ACCESS LEFT  02/28/2019  . IR US GUIDE VASC ACCESS RIGHT  02/28/2019  . LAPAROSCOPIC GASTRIC SLEEVE RESECTION  07/2017  . PERINEAL LACERATION REPAIR N/A 02/28/2019   Procedure: SUTURE REPAIR PERINEAL LACERATION;  Surgeon: Myna Hidalgo, DO;  Location: [facility] LD ORS;  Service: Gynecology;  Laterality: N/A;  . REMOVAL OF STONES  04/21/2018   Procedure: REMOVAL OF STONES;  Surgeon: Kerin Salen, MD;  Location: [facility] ENDOSCOPY;  Service: Gastroenterology;;  . Dennison Mascot  04/21/2018   Procedure: SPHINCTEROTOMY;  Surgeon: Kerin Salen, MD;  Location: [facility] ENDOSCOPY;  Service: Gastroenterology;;     Allergies:  No Known Allergies   Social History:  reports that she has never smoked. She has never used smokeless tobacco. She reports previous alcohol use. She reports  that she does not use drugs.   Family History: Family History  Problem Relation Age of Onset  . Heart disease Father   . Hypertension Father   . Hyperlipidemia Father   . Cancer Maternal Grandfather   . Hypertension Mother   . Leukemia Maternal Aunt       Physical Exam: Vitals:   05/25/20 1615 05/25/20 1620 05/25/20 1625 05/25/20 1630  BP:      Pulse:      Resp:      Temp:      TempSrc:      SpO2: 97% 98% 96% 98%    Constitutional: Alert and awake, oriented x3, mildly-ill appearing Eyes:  EOMI, irises appear normal, anicteric sclera ENMT:  external ears and nose appear normal, normal hearing, Lips appear normal, oropharynx mucosa, tongue appear normal  Neck: neck appears normal, no masses, normal ROM, no thyromegaly, no JVD  CVS: S1-S2 clear, no murmur rubs or gallops, no LE edema, normal pedal pulses  Respiratory:  clear to auscultation bilaterally, no wheezing, rales or rhonchi. Respiratory effort normal. No accessory muscle use.  Abdomen: gravid, nontender, EFM in place Musculoskeletal: : no cyanosis, clubbing or edema noted bilaterally Neuro: Cranial nerves II-XII intact, strength, sensation, reflexes Psych: judgement and insight appear normal, stable mood and affect, mental status Skin: no rashes or lesions or ulcers, no induration or nodules    Data reviewed:  I have personally reviewed the recent labs and imaging studies  Pertinent Labs:   CO2 19 BUN 9/Creatinine 1.46/GFR 44; 10/0.84/>60 on 03/02/19 AP 190 Albumin 2.0 AST 542/ALT 146 LDH 708 HS troponin 7 Ferritin 976 CRP 14.5 Procalcitonin 0.83 D-dimer 1.56 Fibrinogen 536 WBC 7.1, +lymphopenia   Inpatient Medications:   Scheduled Meds: . vitamin C  500 mg Oral Daily  . docusate sodium  100 mg Oral Daily  . enoxaparin (LOVENOX) injection  40 mg Subcutaneous Q24H  . insulin aspart  0-15 Units Subcutaneous TID WC  . insulin aspart  0-5 Units Subcutaneous QHS  . methylPREDNISolone (SOLU-MEDROL) injection  0.5 mg/kg Intravenous Q12H   Followed by  . [START ON 05/28/2020] predniSONE  50 mg Oral Daily  . prenatal multivitamin  1 tablet Oral Q1200  . sodium chloride flush  3 mL Intravenous Q12H  . zinc sulfate  220 mg Oral Daily   Continuous Infusions: . sodium chloride    . remdesivir 200 mg in sodium chloride 0.9% 250 mL IVPB     Followed by  . [START ON 05/26/2020] remdesivir 100 mg in NS 100 mL       Radiological Exams on Admission: DG CHEST PORT 1 VIEW  Result Date: 05/25/2020 CLINICAL DATA:  Shortness of breath.  COVID-19 positive EXAM: PORTABLE  CHEST 1 VIEW COMPARISON:  April 17, 2018 FINDINGS: There is airspace opacity consistent with pneumonia in the right lower lung region. Lungs elsewhere clear. Heart size and pulmonary vascularity are normal. No adenopathy. No bone lesions. IMPRESSION: Airspace opacity right base consistent with pneumonia. COVID-19 pneumonia potentially could present in this manner, although bacterial pneumonia could present similarly. Lungs elsewhere clear. Cardiac silhouette normal. No adenopathy. Electronically Signed   By: Bretta Bang III M.D.   On: 05/25/2020 13:24    Impression/Recommendations Principal Problem:   Acute hypoxemic respiratory failure due to COVID-19 [facility]) Active Problems:   Supervision of normal IUP (intrauterine pregnancy) in multigravida, third trimester   Elevated LFTs   Acute respiratory failure with hypoxia due to COVID-19 PNA -Patient with presenting with SOB  and worsening hypoxia -Upon arrival she was on 6L [place] O2 with hypoxia to the 80s and O2 was escalated, but she has since stabilized to some extent and appears to be requiring 5L  O2; PCCM has consulted and recommends transitioning to HFNC at 5L -COVID POSITIVE - but this does not appear to be documented in Epic and so the test was repeated -Pertinent labs concerning for COVID include lymphopenia; increased BUN/Creatinine; significantly increased LFTs (see below); increased LDH; markedly elevated D-dimer (>1, but this is expected in pregnancy); increased ferritin; low procalcitonin; markedly elevated CRP (>7); increased fibrinogen -CXR with multifocal opacities which may be c/w COVID vs. Multifocal PNA -Will not treat with broad-spectrum antibiotics given procalcitonin <1 -Will admit for further evaluation, close monitoring, and treatment -Monitor on telemetry x at least 24 hours -At this time, will attempt to avoid use of aerosolized medications and use HFAs instead -Will check daily labs including BMP with Mag, Phos; LFTs;  CBC with differential; CRP; ferritin; fibrinogen; D-dimer -Will order steroids and Remdesivir (pharmacy consult) given +COVID test, +CXR, and hypoxia <94% on room air -If the patient shows clinical deterioration, consider transfer to ICU with PCCM consultation -Will attempt to maintain euvolemia to a net negative fluid status -Patient was seen wearing full PPE including: gown, gloves, head cover, N95, and face shield; donning and doffing was in compliance with current standards.  IUP -G2P1 at 35+3 weeks' gestation -Pregnancy complicated by cervical incompetence, s/p cerclage -Prior pregnancy was complicated by post-partum hemorrhage and DIC with >4L loss -If her COVID worsens, she may need consideration of early delivery  -She is receiving steroids now for her own medical stabilization and this should also help with fetal lung maturity  Elevated LFTs -may be associated with COVID-19 infection, but this does appear to be more elevated than usual in comparison to other labs -As such, this raises the question of concomitantly developing pre-E/HELLP -Will continue to monitor daily LFTs     Thank you for this consultation.  Our [facility] hospitalist team will co-manage the patient with you.   Time Spent: 55 minutes  Jonah Blue M.D. [facility] 05/25/2020, 5:09 PM

## 2020-05-25 NOTE — Progress Notes (Signed)
Progress Note -    Reason for consultation: 35+3, diagnosed with COVID 2 days ago.  Sent by OB this AM, on 6L and now 10L of HFNC with NRB.  CXR with multifocal PNA.  OB care vs. ICU.    History of Present Illness: Kristy Hines is an 39 y.o. female with term IUP with cerclage placement presenting with worsening COVID symptoms.     Called for consult on this patient.  Given her advanced pregnancy with what appears to be severe COVID-19 infection on 10L HFNC O2 + NRB, I discussed with PCCM.  At this time, PCCM will consult on the patient and will likely assume care.  If that changes and the patient is thought to be more stable than she currently appears, TRH will be happy to co-manage this patient with OB.   Kristy Hines, M.D.

## 2020-05-26 ENCOUNTER — Encounter (HOSPITAL_COMMUNITY)
Admission: AD | Disposition: A | Discharge status: Home / Self Care | Payer: Self-pay | Source: Home / Self Care | Attending: Obstetrics & Gynecology

## 2020-05-26 ENCOUNTER — Inpatient Hospital Stay (HOSPITAL_COMMUNITY): Payer: BLUE CROSS/BLUE SHIELD | Admitting: Certified Registered Nurse Anesthetist

## 2020-05-26 DIAGNOSIS — R7989 Other specified abnormal findings of blood chemistry: Secondary | ICD-10-CM

## 2020-05-26 DIAGNOSIS — J9601 Acute respiratory failure with hypoxia: Secondary | ICD-10-CM

## 2020-05-26 DIAGNOSIS — R0602 Shortness of breath: Secondary | ICD-10-CM

## 2020-05-26 LAB — CBC WITH DIFFERENTIAL/PLATELET
Abs Immature Granulocytes: 0 10*3/uL (ref 0.00–0.07)
Basophils Absolute: 0 10*3/uL (ref 0.0–0.1)
Basophils Relative: 0 %
Eosinophils Absolute: 0 10*3/uL (ref 0.0–0.5)
Eosinophils Relative: 0 %
HCT: 39.9 % (ref 36.0–46.0)
Hemoglobin: 12.8 g/dL (ref 12.0–15.0)
Lymphocytes Relative: 8 %
Lymphs Abs: 0.6 10*3/uL — ABNORMAL LOW (ref 0.7–4.0)
MCH: 28.8 pg (ref 26.0–34.0)
MCHC: 32.1 g/dL (ref 30.0–36.0)
MCV: 89.7 fL (ref 80.0–100.0)
Monocytes Absolute: 0.1 10*3/uL (ref 0.1–1.0)
Monocytes Relative: 1 %
Neutro Abs: 6.9 10*3/uL (ref 1.7–7.7)
Neutrophils Relative %: 91 %
Platelets: 156 10*3/uL (ref 150–400)
RBC: 4.45 MIL/uL (ref 3.87–5.11)
RDW: 13.9 % (ref 11.5–15.5)
WBC: 7.6 10*3/uL (ref 4.0–10.5)
nRBC: 0.3 % — ABNORMAL HIGH (ref 0.0–0.2)
nRBC: 1 /100 WBC — ABNORMAL HIGH

## 2020-05-26 LAB — COMPREHENSIVE METABOLIC PANEL
ALT: 267 U/L — ABNORMAL HIGH (ref 0–44)
AST: 1158 U/L — ABNORMAL HIGH (ref 15–41)
Albumin: 2 g/dL — ABNORMAL LOW (ref 3.5–5.0)
Alkaline Phosphatase: 201 U/L — ABNORMAL HIGH (ref 38–126)
Anion gap: 12 (ref 5–15)
BUN: 10 mg/dL (ref 6–20)
CO2: 17 mmol/L — ABNORMAL LOW (ref 22–32)
Calcium: 8.1 mg/dL — ABNORMAL LOW (ref 8.9–10.3)
Chloride: 111 mmol/L (ref 98–111)
Creatinine, Ser: 1.4 mg/dL — ABNORMAL HIGH (ref 0.44–1.00)
GFR calc Af Amer: 54 mL/min — ABNORMAL LOW (ref >60)
GFR calc non Af Amer: 47 mL/min — ABNORMAL LOW (ref >60)
Glucose, Bld: 132 mg/dL — ABNORMAL HIGH (ref 70–99)
Potassium: 4.4 mmol/L (ref 3.5–5.1)
Sodium: 140 mmol/L (ref 135–145)
Total Bilirubin: 1.1 mg/dL (ref 0.3–1.2)
Total Protein: 5.5 g/dL — ABNORMAL LOW (ref 6.5–8.1)

## 2020-05-26 LAB — HIV ANTIBODY (ROUTINE TESTING W REFLEX): HIV Screen 4th Generation wRfx: NONREACTIVE

## 2020-05-26 LAB — PHOSPHORUS: Phosphorus: 5 mg/dL — ABNORMAL HIGH (ref 2.5–4.6)

## 2020-05-26 LAB — SODIUM, URINE, RANDOM: Sodium, Ur: 20 mmol/L

## 2020-05-26 LAB — D-DIMER, QUANTITATIVE: D-Dimer, Quant: 1.37 ug/mL-FEU — ABNORMAL HIGH (ref 0.00–0.50)

## 2020-05-26 LAB — CREATININE, URINE, RANDOM: Creatinine, Urine: 32.59 mg/dL

## 2020-05-26 LAB — MAGNESIUM: Magnesium: 2.3 mg/dL (ref 1.7–2.4)

## 2020-05-26 LAB — GLUCOSE, CAPILLARY: Glucose-Capillary: 199 mg/dL — ABNORMAL HIGH (ref 70–99)

## 2020-05-26 LAB — C-REACTIVE PROTEIN: CRP: 12.9 mg/dL — ABNORMAL HIGH (ref <1.0)

## 2020-05-26 LAB — FERRITIN: Ferritin: 1675 ng/mL — ABNORMAL HIGH (ref 11–307)

## 2020-05-26 SURGERY — Surgical Case
Anesthesia: Spinal

## 2020-05-26 MED ORDER — SOD CITRATE-CITRIC ACID 500-334 MG/5ML PO SOLN
30.0000 mL | ORAL | Status: AC
  Administered 2020-05-26: 30 mL via ORAL
  Filled 2020-05-26: qty 15

## 2020-05-26 MED ORDER — ONDANSETRON HCL 4 MG/2ML IJ SOLN: 4.0000 mg | Freq: Once | INTRAMUSCULAR | Status: DC | PRN

## 2020-05-26 MED ORDER — ENOXAPARIN SODIUM 60 MG/0.6ML ~~LOC~~ SOLN
50.0000 mg | SUBCUTANEOUS | Status: DC
  Administered 2020-05-27 – 2020-05-28 (×2): 50 mg via SUBCUTANEOUS
  Filled 2020-05-26 (×2): qty 0.6

## 2020-05-26 MED ORDER — ZOLPIDEM TARTRATE 5 MG PO TABS
5.0000 mg | ORAL_TABLET | Freq: Every evening | ORAL | Status: DC | PRN
  Administered 2020-05-31: 5 mg via ORAL
  Filled 2020-05-26: qty 1

## 2020-05-26 MED ORDER — LACTATED RINGERS IV SOLN: INTRAVENOUS | Status: DC

## 2020-05-26 MED ORDER — IBUPROFEN 800 MG PO TABS: 800.0000 mg | ORAL_TABLET | Freq: Three times a day (TID) | ORAL | Status: DC

## 2020-05-26 MED ORDER — CEFAZOLIN SODIUM-DEXTROSE 2-4 GM/100ML-% IV SOLN
2.0000 g | INTRAVENOUS | Status: AC
  Administered 2020-05-26: 2 g via INTRAVENOUS
  Filled 2020-05-26: qty 100

## 2020-05-26 MED ORDER — BUPIVACAINE IN DEXTROSE 0.75-8.25 % IT SOLN
INTRATHECAL | Status: DC | PRN
  Administered 2020-05-26: 1.6 mL via INTRATHECAL

## 2020-05-26 MED ORDER — METHYLPREDNISOLONE SODIUM SUCC 125 MG IJ SOLR
80.0000 mg | Freq: Two times a day (BID) | INTRAMUSCULAR | Status: AC
  Administered 2020-05-26 – 2020-06-05 (×20): 80 mg via INTRAVENOUS
  Filled 2020-05-26 (×21): qty 2

## 2020-05-26 MED ORDER — SIMETHICONE 80 MG PO CHEW
80.0000 mg | CHEWABLE_TABLET | ORAL | Status: DC | PRN
  Administered 2020-06-03: 80 mg via ORAL

## 2020-05-26 MED ORDER — SODIUM CHLORIDE 0.9 % IV SOLN: 100.0000 mg | Freq: Every day | INTRAVENOUS | Status: DC

## 2020-05-26 MED ORDER — MEPERIDINE HCL 25 MG/ML IJ SOLN: 6.2500 mg | INTRAMUSCULAR | Status: DC | PRN

## 2020-05-26 MED ORDER — ONDANSETRON HCL 4 MG/2ML IJ SOLN
INTRAMUSCULAR | Status: DC | PRN
  Administered 2020-05-26: 4 mg via INTRAVENOUS

## 2020-05-26 MED ORDER — LACTATED RINGERS IV SOLN: INTRAVENOUS | Status: DC | PRN

## 2020-05-26 MED ORDER — SIMETHICONE 80 MG PO CHEW
80.0000 mg | CHEWABLE_TABLET | Freq: Three times a day (TID) | ORAL | Status: DC
  Administered 2020-05-26 – 2020-06-05 (×24): 80 mg via ORAL
  Filled 2020-05-26 (×25): qty 1

## 2020-05-26 MED ORDER — FENTANYL CITRATE (PF) 100 MCG/2ML IJ SOLN
INTRAMUSCULAR | Status: DC | PRN
  Administered 2020-05-26: 25 ug via INTRAVENOUS
  Administered 2020-05-26: 35 ug via INTRAVENOUS

## 2020-05-26 MED ORDER — MORPHINE SULFATE (PF) 0.5 MG/ML IJ SOLN
INTRAMUSCULAR | Status: AC
  Filled 2020-05-26: qty 10

## 2020-05-26 MED ORDER — OXYTOCIN-SODIUM CHLORIDE 30-0.9 UT/500ML-% IV SOLN
INTRAVENOUS | Status: DC | PRN
  Administered 2020-05-26: 300 mL via INTRAVENOUS

## 2020-05-26 MED ORDER — COCONUT OIL OIL: 1.0000 "application " | TOPICAL_OIL | Status: DC | PRN

## 2020-05-26 MED ORDER — MENTHOL 3 MG MT LOZG
1.0000 | LOZENGE | OROMUCOSAL | Status: DC | PRN
  Administered 2020-05-27 – 2020-06-04 (×3): 3 mg via ORAL
  Filled 2020-05-26 (×6): qty 9

## 2020-05-26 MED ORDER — SODIUM CHLORIDE 0.9 % IV SOLN: 200.0000 mg | Freq: Once | INTRAVENOUS | Status: DC

## 2020-05-26 MED ORDER — MEPERIDINE HCL 25 MG/ML IJ SOLN
INTRAMUSCULAR | Status: DC | PRN
  Administered 2020-05-26 (×2): 12.5 mg via INTRAVENOUS

## 2020-05-26 MED ORDER — FENTANYL CITRATE (PF) 100 MCG/2ML IJ SOLN
INTRAMUSCULAR | Status: DC | PRN
Start: 2020-05-26 — End: 2020-05-26
  Administered 2020-05-26: 15 ug via INTRATHECAL

## 2020-05-26 MED ORDER — PHENYLEPHRINE HCL-NACL 20-0.9 MG/250ML-% IV SOLN
INTRAVENOUS | Status: DC | PRN
  Administered 2020-05-26: 30 ug/min via INTRAVENOUS
  Administered 2020-05-26: 60 ug/min via INTRAVENOUS

## 2020-05-26 MED ORDER — WITCH HAZEL-GLYCERIN EX PADS: 1.0000 "application " | MEDICATED_PAD | CUTANEOUS | Status: DC | PRN

## 2020-05-26 MED ORDER — SENNOSIDES-DOCUSATE SODIUM 8.6-50 MG PO TABS
2.0000 | ORAL_TABLET | ORAL | Status: DC
  Administered 2020-05-28 – 2020-06-04 (×7): 2 via ORAL
  Filled 2020-05-26 (×8): qty 2

## 2020-05-26 MED ORDER — OXYTOCIN-SODIUM CHLORIDE 30-0.9 UT/500ML-% IV SOLN
INTRAVENOUS | Status: AC
  Filled 2020-05-26: qty 500

## 2020-05-26 MED ORDER — OXYCODONE HCL 5 MG/5ML PO SOLN: 5.0000 mg | Freq: Once | ORAL | Status: DC | PRN

## 2020-05-26 MED ORDER — PREDNISONE 20 MG PO TABS: 50.0000 mg | ORAL_TABLET | Freq: Every day | ORAL | Status: DC

## 2020-05-26 MED ORDER — SIMETHICONE 80 MG PO CHEW
80.0000 mg | CHEWABLE_TABLET | ORAL | Status: DC
  Administered 2020-05-26 – 2020-06-04 (×9): 80 mg via ORAL
  Filled 2020-05-26 (×10): qty 1

## 2020-05-26 MED ORDER — OXYCODONE HCL 5 MG PO TABS
5.0000 mg | ORAL_TABLET | ORAL | Status: DC | PRN
  Administered 2020-05-27 – 2020-05-29 (×4): 5 mg via ORAL
  Filled 2020-05-26 (×5): qty 1

## 2020-05-26 MED ORDER — SODIUM CHLORIDE 0.9 % IV SOLN: INTRAVENOUS | Status: DC

## 2020-05-26 MED ORDER — METHYLPREDNISOLONE SODIUM SUCC 125 MG IJ SOLR: 1.0000 mg/kg | Freq: Two times a day (BID) | INTRAMUSCULAR | Status: DC

## 2020-05-26 MED ORDER — ACETAMINOPHEN 325 MG PO TABS: 325.0000 mg | ORAL_TABLET | ORAL | Status: DC | PRN

## 2020-05-26 MED ORDER — FENTANYL CITRATE (PF) 100 MCG/2ML IJ SOLN: 25.0000 ug | INTRAMUSCULAR | Status: DC | PRN

## 2020-05-26 MED ORDER — DIBUCAINE (PERIANAL) 1 % EX OINT: 1.0000 "application " | TOPICAL_OINTMENT | CUTANEOUS | Status: DC | PRN

## 2020-05-26 MED ORDER — PHENYLEPHRINE HCL-NACL 20-0.9 MG/250ML-% IV SOLN
INTRAVENOUS | Status: AC
  Filled 2020-05-26: qty 250

## 2020-05-26 MED ORDER — OXYCODONE HCL 5 MG PO TABS: 5.0000 mg | ORAL_TABLET | Freq: Once | ORAL | Status: DC | PRN

## 2020-05-26 MED ORDER — ACETAMINOPHEN 160 MG/5ML PO SOLN: 325.0000 mg | ORAL | Status: DC | PRN

## 2020-05-26 MED ORDER — OXYTOCIN-SODIUM CHLORIDE 30-0.9 UT/500ML-% IV SOLN: 2.5000 [IU]/h | INTRAVENOUS | Status: AC

## 2020-05-26 MED ORDER — FENTANYL CITRATE (PF) 100 MCG/2ML IJ SOLN
INTRAMUSCULAR | Status: AC
  Filled 2020-05-26: qty 2

## 2020-05-26 MED ORDER — ONDANSETRON HCL 4 MG/2ML IJ SOLN
INTRAMUSCULAR | Status: AC
  Filled 2020-05-26: qty 2

## 2020-05-26 MED ORDER — HYDROMORPHONE HCL 1 MG/ML IJ SOLN: 0.2000 mg | INTRAMUSCULAR | Status: DC | PRN

## 2020-05-26 MED ORDER — MEPERIDINE HCL 25 MG/ML IJ SOLN
INTRAMUSCULAR | Status: AC
  Filled 2020-05-26: qty 1

## 2020-05-26 MED ORDER — MORPHINE SULFATE (PF) 0.5 MG/ML IJ SOLN
INTRAMUSCULAR | Status: DC | PRN
Start: 2020-05-26 — End: 2020-05-26
  Administered 2020-05-26: 150 ug via INTRATHECAL

## 2020-05-26 MED ORDER — DIPHENHYDRAMINE HCL 25 MG PO CAPS: 25.0000 mg | ORAL_CAPSULE | Freq: Four times a day (QID) | ORAL | Status: DC | PRN

## 2020-05-26 SURGICAL SUPPLY — 35 items
APL SKNCLS STERI-STRIP NONHPOA (GAUZE/BANDAGES/DRESSINGS) ×1
BARRIER ADHS 3X4 INTERCEED (GAUZE/BANDAGES/DRESSINGS) IMPLANT
BENZOIN TINCTURE PRP APPL 2/3 (GAUZE/BANDAGES/DRESSINGS) ×2 IMPLANT
BRR ADH 4X3 ABS CNTRL BYND (GAUZE/BANDAGES/DRESSINGS)
CHLORAPREP W/TINT 26ML (MISCELLANEOUS) ×2 IMPLANT
CLAMP CORD UMBIL (MISCELLANEOUS) IMPLANT
CLOTH BEACON ORANGE TIMEOUT ST (SAFETY) ×2 IMPLANT
DRSG OPSITE POSTOP 4X10 (GAUZE/BANDAGES/DRESSINGS) ×2 IMPLANT
ELECT REM PT RETURN 9FT ADLT (ELECTROSURGICAL) ×2
ELECTRODE REM PT RTRN 9FT ADLT (ELECTROSURGICAL) ×1 IMPLANT
EXTRACTOR VACUUM KIWI (MISCELLANEOUS) IMPLANT
GLOVE BIOGEL M 7.0 STRL (GLOVE) ×4 IMPLANT
GLOVE BIOGEL PI IND STRL 7.0 (GLOVE) ×3 IMPLANT
GLOVE BIOGEL PI INDICATOR 7.0 (GLOVE) ×3
GOWN STRL REUS W/TWL LRG LVL3 (GOWN DISPOSABLE) ×6 IMPLANT
KIT ABG SYR 3ML LUER SLIP (SYRINGE) IMPLANT
NEEDLE HYPO 25X5/8 SAFETYGLIDE (NEEDLE) IMPLANT
NS IRRIG 1000ML POUR BTL (IV SOLUTION) ×2 IMPLANT
PACK C SECTION WH (CUSTOM PROCEDURE TRAY) ×2 IMPLANT
PAD OB MATERNITY 4.3X12.25 (PERSONAL CARE ITEMS) ×2 IMPLANT
PENCIL SMOKE EVAC W/HOLSTER (ELECTROSURGICAL) ×2 IMPLANT
RTRCTR C-SECT PINK 25CM LRG (MISCELLANEOUS) IMPLANT
STRIP CLOSURE SKIN 1/2X4 (GAUZE/BANDAGES/DRESSINGS) ×2 IMPLANT
SUT PDS AB 0 CT1 27 (SUTURE) ×4 IMPLANT
SUT PLAIN 0 NONE (SUTURE) IMPLANT
SUT VIC AB 0 CTX 36 (SUTURE) ×6
SUT VIC AB 0 CTX36XBRD ANBCTRL (SUTURE) ×3 IMPLANT
SUT VIC AB 2-0 CT1 27 (SUTURE) ×2
SUT VIC AB 2-0 CT1 TAPERPNT 27 (SUTURE) ×1 IMPLANT
SUT VIC AB 3-0 SH 27 (SUTURE)
SUT VIC AB 3-0 SH 27X BRD (SUTURE) IMPLANT
SUT VIC AB 4-0 KS 27 (SUTURE) ×2 IMPLANT
TOWEL OR 17X24 6PK STRL BLUE (TOWEL DISPOSABLE) ×2 IMPLANT
TRAY FOLEY W/BAG SLVR 14FR LF (SET/KITS/TRAYS/PACK) ×2 IMPLANT
WATER STERILE IRR 1000ML POUR (IV SOLUTION) ×2 IMPLANT

## 2020-05-26 NOTE — Progress Notes (Signed)
Kristy Hines is a 39 y.o. G2P1001 at [redacted]w[redacted]d by LMP admitted for management of Covid 19 infection   Subjective: Patient reports being very tired and sob no chest pain + FM no lof no vaginal bleeding no contractions   Objective: BP 103/67 (BP Location: Right Arm)    Pulse 70    Temp (!) 96.4 F (35.8 C) (Axillary)    Resp (!) 31    Ht 5\' 5"  (1.651 m)    Wt 106.6 kg    LMP 09/20/2019 (Exact Date)    SpO2 92%    BMI 39.11 kg/m  I/O last 3 completed shifts: In: 823.8 [I.V.:817.9; IV Piggyback:5.9] Out: 900 [Urine:900] No intake/output data recorded.  FHT:  FHR: 120 bpm, variability: moderate,  accelerations:  Present,  decelerations:  Absent UC:   Irregular  SVE:  Deferred.     Labs: Lab Results  Component Value Date   WBC 7.6 05/26/2020   HGB 12.8 05/26/2020   HCT 39.9 05/26/2020   MCV 89.7 05/26/2020   PLT 156 05/26/2020    Results for orders placed or performed during the hospital encounter of 05/25/20 (from the past 24 hour(s))  CBC with Differential/Platelet     Status: Abnormal   Collection Time: 05/25/20  2:18 PM  Result Value Ref Range   WBC 7.1 4.0 - 10.5 K/uL   RBC 4.60 3.87 - 5.11 MIL/uL   Hemoglobin 12.9 12.0 - 15.0 g/dL   HCT 05/27/20 36 - 46 %   MCV 89.6 80.0 - 100.0 fL   MCH 28.0 26.0 - 34.0 pg   MCHC 31.3 30.0 - 36.0 g/dL   RDW 00.9 23.3 - 00.7 %   Platelets 147 (L) 150 - 400 K/uL   nRBC 0.3 (H) 0.0 - 0.2 %   Neutrophils Relative % 87 %   Neutro Abs 6.2 1.7 - 7.7 K/uL   Lymphocytes Relative 7 %   Lymphs Abs 0.5 (L) 0.7 - 4.0 K/uL   Monocytes Relative 5 %   Monocytes Absolute 0.4 0 - 1 K/uL   Eosinophils Relative 0 %   Eosinophils Absolute 0.0 0 - 0 K/uL   Basophils Relative 1 %   Basophils Absolute 0.1 0 - 0 K/uL   WBC Morphology See Note    nRBC 0 0 /100 WBC   Abs Immature Granulocytes 0.00 0.00 - 0.07 K/uL  Comprehensive metabolic panel     Status: Abnormal   Collection Time: 05/25/20  2:18 PM  Result Value Ref Range   Sodium 139 135 - 145 mmol/L    Potassium 4.3 3.5 - 5.1 mmol/L   Chloride 108 98 - 111 mmol/L   CO2 19 (L) 22 - 32 mmol/L   Glucose, Bld 102 (H) 70 - 99 mg/dL   BUN 9 6 - 20 mg/dL   Creatinine, Ser 05/27/20 (H) 0.44 - 1.00 mg/dL   Calcium 7.8 (L) 8.9 - 10.3 mg/dL   Total Protein 5.5 (L) 6.5 - 8.1 g/dL   Albumin 2.0 (L) 3.5 - 5.0 g/dL   AST 6.33 (H) 15 - 41 U/L   ALT 146 (H) 0 - 44 U/L   Alkaline Phosphatase 190 (H) 38 - 126 U/L   Total Bilirubin 0.8 0.3 - 1.2 mg/dL   GFR calc non Af Amer 44 (L) >60 mL/min   GFR calc Af Amer 51 (L) >60 mL/min   Anion gap 12 5 - 15  Troponin I (High Sensitivity)     Status: None   Collection  Time: 05/25/20  2:18 PM  Result Value Ref Range   Troponin I (High Sensitivity) 7 <18 ng/L  C-reactive protein     Status: Abnormal   Collection Time: 05/25/20  2:18 PM  Result Value Ref Range   CRP 14.5 (H) <1.0 mg/dL  D-dimer, quantitative (not at [facility])     Status: Abnormal   Collection Time: 05/25/20  2:18 PM  Result Value Ref Range   D-Dimer, Quant 1.56 (H) 0.00 - 0.50 ug/mL-FEU  Fibrinogen     Status: Abnormal   Collection Time: 05/25/20  2:18 PM  Result Value Ref Range   Fibrinogen 536 (H) 210 - 475 mg/dL  Ferritin     Status: Abnormal   Collection Time: 05/25/20  2:18 PM  Result Value Ref Range   Ferritin 976 (H) 11 - 307 ng/mL  Procalcitonin - Baseline     Status: None   Collection Time: 05/25/20  2:18 PM  Result Value Ref Range   Procalcitonin 0.83 ng/mL  Type and screen [facility]     Status: None (Preliminary result)   Collection Time: 05/25/20  2:18 PM  Result Value Ref Range   ABO/RH(D) O NEG    Antibody Screen POS    Sample Expiration 05/28/2020,2359    Antibody Identification ANTI C ANTI E PASSIVELY ACQUIRED ANTI-D    Unit Number [id number]    Blood Component Type RED CELLS,LR    Unit division 00    Status of Unit ALLOCATED    Donor AG Type NEGATIVE FOR C ANTIGEN NEGATIVE FOR E ANTIGEN    Transfusion Status OK TO TRANSFUSE    Crossmatch Result  COMPATIBLE    Unit Number [id number]    Blood Component Type RED CELLS,LR    Unit division 00    Status of Unit ALLOCATED    Donor AG Type NEGATIVE FOR C ANTIGEN NEGATIVE FOR E ANTIGEN    Transfusion Status OK TO TRANSFUSE    Crossmatch Result COMPATIBLE   Lactate dehydrogenase     Status: Abnormal   Collection Time: 05/25/20  2:18 PM  Result Value Ref Range   LDH 708 (H) 98 - 192 U/L  Glucose, capillary     Status: Abnormal   Collection Time: 05/25/20 10:28 PM  Result Value Ref Range   Glucose-Capillary 105 (H) 70 - 99 mg/dL  CBC with Differential/Platelet     Status: Abnormal   Collection Time: 05/26/20  5:49 AM  Result Value Ref Range   WBC 7.6 4.0 - 10.5 K/uL   RBC 4.45 3.87 - 5.11 MIL/uL   Hemoglobin 12.8 12.0 - 15.0 g/dL   HCT 96.2 36 - 46 %   MCV 89.7 80.0 - 100.0 fL   MCH 28.8 26.0 - 34.0 pg   MCHC 32.1 30.0 - 36.0 g/dL   RDW 95.2 84.1 - 32.4 %   Platelets 156 150 - 400 K/uL   nRBC 0.3 (H) 0.0 - 0.2 %   Neutrophils Relative % 91 %   Neutro Abs 6.9 1.7 - 7.7 K/uL   Lymphocytes Relative 8 %   Lymphs Abs 0.6 (L) 0.7 - 4.0 K/uL   Monocytes Relative 1 %   Monocytes Absolute 0.1 0 - 1 K/uL   Eosinophils Relative 0 %   Eosinophils Absolute 0.0 0 - 0 K/uL   Basophils Relative 0 %   Basophils Absolute 0.0 0 - 0 K/uL   nRBC 1 (H) 0 /100 WBC   Abs Immature Granulocytes 0.00 0.00 - 0.07  K/uL   Polychromasia PRESENT   Comprehensive metabolic panel     Status: Abnormal   Collection Time: 05/26/20  5:49 AM  Result Value Ref Range   Sodium 140 135 - 145 mmol/L   Potassium 4.4 3.5 - 5.1 mmol/L   Chloride 111 98 - 111 mmol/L   CO2 17 (L) 22 - 32 mmol/L   Glucose, Bld 132 (H) 70 - 99 mg/dL   BUN 10 6 - 20 mg/dL   Creatinine, Ser 1.61 (H) 0.44 - 1.00 mg/dL   Calcium 8.1 (L) 8.9 - 10.3 mg/dL   Total Protein 5.5 (L) 6.5 - 8.1 g/dL   Albumin 2.0 (L) 3.5 - 5.0 g/dL   AST 0,960 (H) 15 - 41 U/L   ALT 267 (H) 0 - 44 U/L   Alkaline Phosphatase 201 (H) 38 - 126 U/L   Total  Bilirubin 1.1 0.3 - 1.2 mg/dL   GFR calc non Af Amer 47 (L) >60 mL/min   GFR calc Af Amer 54 (L) >60 mL/min   Anion gap 12 5 - 15  C-reactive protein     Status: Abnormal   Collection Time: 05/26/20  5:49 AM  Result Value Ref Range   CRP 12.9 (H) <1.0 mg/dL  D-dimer, quantitative (not at [facility])     Status: Abnormal   Collection Time: 05/26/20  5:49 AM  Result Value Ref Range   D-Dimer, Quant 1.37 (H) 0.00 - 0.50 ug/mL-FEU  Ferritin     Status: Abnormal   Collection Time: 05/26/20  5:49 AM  Result Value Ref Range   Ferritin 1,675 (H) 11 - 307 ng/mL  Magnesium     Status: None   Collection Time: 05/26/20  5:49 AM  Result Value Ref Range   Magnesium 2.3 1.7 - 2.4 mg/dL  Phosphorus     Status: Abnormal   Collection Time: 05/26/20  5:49 AM  Result Value Ref Range   Phosphorus 5.0 (H) 2.5 - 4.6 mg/dL   Assessment / Plan: 35 wks and 4 days with covid 19 infection  - 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 Judeth Cornfield delivery by cesarean section to be peformed this AM if enzymes were increasing.  R/B/A of cesarean sectio discussed with Mrs. Goya including but not limited to infection bleeding damage to bowel bladder baby with the need for further surgery. R/O transfusion discussed. . Pt voiced understanding and desires to proceed.   Gerald Leitz 05/26/2020, 9:47 AM

## 2020-05-26 NOTE — Progress Notes (Signed)
Capillary blood glucose 119. After blood sugar monitor was docked, CBG did not upload to Epic.

## 2020-05-26 NOTE — Anesthesia Procedure Notes (Addendum)
Spinal  Patient location during procedure: OR Start time: 05/26/2020 11:15 AM End time: 05/26/2020 11:20 AM Staffing Anesthesiologist: Bethena Midget, MD Preanesthetic Checklist Completed: patient identified, IV checked, site marked, risks and benefits discussed, surgical consent, monitors and equipment checked, pre-op evaluation and timeout performed Spinal Block Patient position: sitting Prep: DuraPrep Patient monitoring: heart rate, cardiac monitor, continuous pulse ox and blood pressure Approach: midline Location: L3-4 Injection technique: single-shot Needle Needle type: Sprotte  Needle gauge: 24 G Needle length: 9 cm Assessment Sensory level: T6

## 2020-05-26 NOTE — Progress Notes (Signed)
OB Delrae Sawyers notifited of desat event @0525 . OB referred to Hospitalist recommendations.   Hospitalist T. Opyd notified @0530  of patient O2 desating event and RN interventions including:  Patient returning from ambulating to bathroom w/ O2 sating @78 % on 7L.   Patient O2 recovering to 82% on 7L.  O2 increased to 9L with patient sat recovering to 88%.  O2 increased to 10L with patient sat recovering to 90 %.  Patient alert and oriented x4 during desating event. Breathing tachypenic but unlabored.   Hospitalist reviewed chart and recommended continuing current plan of care. Start titrating O2 down as tolerated after one hour. Notify MD if patient requires >10L O2.

## 2020-05-26 NOTE — Transfer of Care (Signed)
Immediate Anesthesia Transfer of Care Note  Patient: Kristy Hines  Procedure(s) Performed: CESAREAN SECTION (N/A )  Patient Location: PACU  Anesthesia Type:Spinal  Level of Consciousness: awake, alert  and patient cooperative  Airway & Oxygen Therapy: Patient Spontanous Breathing and non-rebreather face mask  Post-op Assessment: Report given to RN and Post -op Vital signs reviewed and stable  Post vital signs: Reviewed and stable  Last Vitals:  Vitals Value Taken Time  BP 93/65 05/26/20 1245  Temp 36 C 05/26/20 1245  Pulse 92 05/26/20 1257  Resp 31 05/26/20 1257  SpO2 90 % 05/26/20 1257  Vitals shown include unvalidated device data.  Last Pain:  Vitals:   05/26/20 1245  TempSrc: Axillary  PainSc: 0-No pain      Patients Stated Pain Goal: 3 (05/25/20 1815)  Complications: No complications documented.

## 2020-05-26 NOTE — Op Note (Signed)
Cesarean Section Procedure Note  Indications: 35 wks and 4 days with elevated liver enzymes/ covid 19 pneumonia/ h/o severe postpartum hemorrhage after previous vaginal delivery. Primary cesarean section recommended by Dr. Judeth Cornfield with MFM  Pre-operative Diagnosis: 35 week 4 day pregnancy.  Post-operative Diagnosis: same  Surgeon: Dr.  Gerald Leitz   Assistants: Dr. Myna Hidalgo   Anesthesia: Spinal anesthesia  ASA Class: 2   Procedure Details   The patient was seen in the Holding Room. The risks, benefits, complications, treatment options, and expected outcomes were discussed with the patient.  The patient concurred with the proposed plan, giving informed consent.  The site of surgery properly noted/marked. The patient was taken to Operating Room # A, identified as Kristy Hines and the procedure verified as C-Section Delivery. A Time Out was held and the above information confirmed.  After induction of anesthesia, the patient was draped and prepped in the usual sterile manner. A Pfannenstiel incision was made and carried down through the subcutaneous tissue to the fascia. Fascial incision was made and extended transversely. The fascia was separated from the underlying rectus tissue superiorly and inferiorly. 2The peritoneum was identified and entered. Peritoneal incision was extended longitudinally. The utero-vesical peritoneal reflection was incised transversely and the bladder flap was bluntly freed from the lower uterine segment. A low transverse uterine incision was made. Delivered from cephalic presentation was a 2625 gram Female with Apgar scores of 9 at one minute and 9 at five minutes. After the umbilical cord was clamped and cut cord blood was obtained for evaluation. The placenta was removed intact and appeared normal. The uterine outline, tubes and ovaries appeared normal. The uterine incision was closed with running locked sutures of 0 vicryl. A second layer of 0 vicryl was used to  imbricate the incision. . Hemostasis was observed. Lavage was carried out until clear. Interseed was placed along the fascial incision.  The peritoneum was reapproximated with 2-0 vicryl.  The fascia was then reapproximated with running sutures of 0 pds. The skin was reapproximated with 4-0 vicryl.  Instrument, sponge, and needle counts were correct prior the abdominal closure and at the conclusion of the case.   Findings: Female infant in the cephalic presentation. Normal fallopian tubes and ovaries   Estimated Blood Loss:  less than 200 mL         Drains: None         Total IV Fluids:  Per anesthesia ml         Specimens: Placenta and Disposition:  Sent to Pathology          Implants: None         Complications:  None; patient tolerated the procedure well.         Disposition: PACU - hemodynamically stable.         Condition: guarded  Attending Attestation: I performed the procedure.

## 2020-05-26 NOTE — Anesthesia Postprocedure Evaluation (Signed)
Anesthesia Post Note  Patient: Kristy Hines  Procedure(s) Performed: CESAREAN SECTION (N/A )     Patient location during evaluation: PACU Anesthesia Type: Spinal Level of consciousness: oriented and awake and alert Pain management: pain level controlled Vital Signs Assessment: post-procedure vital signs reviewed and stable Respiratory status: spontaneous breathing, respiratory function stable and patient connected to nasal cannula oxygen Cardiovascular status: blood pressure returned to baseline and stable Postop Assessment: no headache, no backache and no apparent nausea or vomiting Anesthetic complications: no   No complications documented.  Last Vitals:  Vitals:   05/26/20 1430 05/26/20 1445  BP: 108/74 107/72  Pulse: 77 74  Resp: (!) 25 20  Temp:    SpO2: 91% (!) 87%    Last Pain:  Vitals:   05/26/20 1445  TempSrc:   PainSc: 0-No pain   Pain Goal: Patients Stated Pain Goal: 3 (05/25/20 1815)              Epidural/Spinal Function Cutaneous sensation: Tingles (05/26/20 1445), Patient able to flex knees: Yes (05/26/20 1445), Patient able to lift hips off bed: No (05/26/20 1445), Back pain beyond tenderness at insertion site: No (05/26/20 1445), Progressively worsening motor and/or sensory loss: No (05/26/20 1445), Bowel and/or bladder incontinence post epidural: No (05/26/20 1445)  Joshuah Minella

## 2020-05-26 NOTE — Progress Notes (Signed)
[facility] CONSULT PROGRESS NOTE    Progress Note  Kristy Hines  NIO:[id number] DOB: 08/15/1981 DOA: 05/25/2020 PCP: Clayborn Heron, MD     Brief Narrative:   Kristy Hines is an 39 y.o. female G2, P1 35 weeks who comes into the hospital for shortness of breath, her husband  and stepson tested positive for COVID-19 1 week ago she started getting short of breath several days came into the ED was placed on 6 L of oxygen.  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  Hemoglobin has remained stable at 12, platelet count of 147, with LFTs AST of 1100 and ALT of 264. Further management per OB/GYN.  Acute hypoxemic respiratory failure due to COVID-19 [facility]): She is requiring 10 L of high flow nasal cannula to keep saturations greater than 90%. I will go ahead and start her on IV remdesivir and IV steroids. Check inflammatory markers dail,  They are trending down except for ferritin.  Keep a close eye on her D-dimer. Procalcitonin is low yield he has remained afebrile with leukocytosis check CBC with differential monitor fever curve. Try to keep the patient prone for Lee 16 hours a day if not prone out of bed to chair, use incentive spirometry as needed.  AKI: Possible prerenal azotemia check urinary sodium urinary creatinine we will start her on aggressive IV fluid hydration hold antihypertensive  medication. Recheck a basic metabolic panel in the morning.  RN Pressure Injury Documentation:    DVT prophylaxis: SCD Family Communication:None Status is: Inpatient  Remains inpatient appropriate because:Hemodynamically unstable   Dispo: The patient is from: Home              Anticipated d/c is to: Home              Anticipated d/c date is: > 3 days              Patient currently is not medically stable to d/c.        Code Status:     Code Status Orders  (From admission, onward)         Start     Ordered   05/25/20 1605  Full code  Continuous        05/25/20 1607        Code Status History    Date Active Date Inactive Code Status Order ID Comments User Context   05/25/2020 1401 05/25/2020 1607 Full Code [id number]  Myna Hidalgo, DO Inpatient   03/01/2019 0703 03/03/2019 1705 Full Code [id number]  Myna Hidalgo, DO Inpatient   02/28/2019 0013 02/28/2019 2302 Full Code [id number]  Myna Hidalgo, DO Inpatient   04/19/2018 1859 04/22/2018 1816 Full Code [id number]  Alessandra Bevels, MD ED   Advance Care Planning Activity        IV Access:    Peripheral IV   Procedures and diagnostic studies:   DG CHEST PORT 1 VIEW  Result Date: 05/25/2020 CLINICAL DATA:  Shortness of breath.  COVID-19 positive EXAM: PORTABLE CHEST 1 VIEW COMPARISON:  April 17, 2018 FINDINGS: There is airspace opacity consistent with pneumonia in the right lower lung region. Lungs elsewhere clear. Heart size and pulmonary vascularity are normal. No adenopathy. No bone lesions. IMPRESSION: Airspace opacity right base consistent with pneumonia. COVID-19 pneumonia potentially could present in this manner, although bacterial pneumonia could present similarly. Lungs elsewhere clear. Cardiac silhouette normal. No adenopathy. Electronically Signed   By: Bretta Bang III M.D.   On: 05/25/2020 13:24     Medical Consultants:    None.  Anti-Infectives:   IV remdesivir  Subjective:    Kristy Hines she relates her breathing is much better this morning, she is anorexic not nauseated or vomiting she does not remember when was her last bowel movement.  Objective:    Vitals:   05/26/20 1415 05/26/20 1430 05/26/20 1445 05/26/20 1516  BP: 109/66 108/74 107/72 106/61  Pulse: 95 77 74 94  Resp: (!) 32 (!) 25 20 18   Temp:    (!) 97.4 F (36.3 C)  TempSrc:    Axillary  SpO2: (!) 87% 91% (!) 87% 91%  Weight:      Height:       SpO2: 91 % O2 Flow Rate (L/min): 10 L/min   Intake/Output Summary (Last 24 hours) at 05/26/2020 1520 Last data filed at 05/26/2020 1445 Gross per 24 hour  Intake 2623.78 ml  Output 1936 ml  Net 687.78 ml   Filed Weights   05/25/20 1754  Weight: 106.6 kg    Exam: General exam: In no acute distress. Respiratory system: Good air movement with diffuse crackles bilaterally. Cardiovascular system: S1 & S2 heard, RRR. No JVD. Gastrointestinal system: Abdomen is nondistended, soft and nontender.  Extremities: No pedal edema. Skin: No rashes, lesions or ulcers   Data Reviewed:    Labs: Basic Metabolic Panel: Recent Labs  Lab 05/25/20 1418 05/26/20 0549  NA 139 140  K 4.3 4.4  CL 108 111  CO2 19* 17*  GLUCOSE 102* 132*  BUN 9 10  CREATININE 1.46* 1.40*  CALCIUM 7.8* 8.1*  MG  --  2.3  PHOS  --  5.0*   GFR Estimated Creatinine Clearance: 64.1 mL/min (A) (by C-G formula based on SCr of 1.4 mg/dL (H)). Liver Function Tests: Recent Labs  Lab 05/25/20 1418 05/26/20 0549  AST 542* 1,158*  ALT 146* 267*  ALKPHOS 190* 201*  BILITOT 0.8 1.1  PROT 5.5* 5.5*  ALBUMIN 2.0* 2.0*   No results for input(s): LIPASE, AMYLASE in the last 168 hours. No results for input(s): AMMONIA in the last 168 hours. Coagulation profile No results for input(s): INR, PROTIME in the last 168 hours. COVID-19 Labs  Recent Labs    05/25/20 1418 05/26/20 0549  DDIMER 1.56* 1.37*  FERRITIN 976* 1,675*  LDH 708*  --   CRP 14.5* 12.9*    Lab Results   Component Value Date   SARSCOV2NAA NEGATIVE 02/18/2020   SARSCOV2NAA NEGATIVE 02/24/2019    CBC: Recent Labs  Lab 05/25/20 1418 05/26/20 0549  WBC 7.1 7.6  NEUTROABS 6.2 6.9  HGB 12.9 12.8  HCT 41.2 39.9  MCV 89.6 89.7  PLT 147* 156   Cardiac Enzymes: No results for input(s): CKTOTAL, CKMB, CKMBINDEX, TROPONINI in the last 168 hours. BNP (last 3 results) No results for input(s): PROBNP in the last 8760 hours. CBG: Recent Labs  Lab 05/25/20 2228  GLUCAP 105*   D-Dimer: Recent Labs    05/25/20  1418 05/26/20 0549  DDIMER 1.56* 1.37*   Hgb A1c: No results for input(s): HGBA1C in the last 72 hours. Lipid Profile: No results for input(s): CHOL, HDL, LDLCALC, TRIG, CHOLHDL, LDLDIRECT in the last 72 hours. Thyroid function studies: No results for input(s): TSH, T4TOTAL, T3FREE, THYROIDAB in the last 72 hours.  Invalid input(s): FREET3 Anemia work up: Recent Labs    05/25/20 1418 05/26/20 0549  FERRITIN 976* 1,675*   Sepsis Labs: Recent Labs  Lab 05/25/20 1418 05/26/20 0549  PROCALCITON 0.83  --   WBC 7.1 7.6   Microbiology No results found for this or any previous visit (from the past 240 hour(s)).   Medications:    vitamin C  500 mg Oral Daily   docusate sodium  100 mg Oral Daily   heparin injection (subcutaneous)  5,000 Units Subcutaneous Q12H   insulin aspart  0-15 Units Subcutaneous TID WC   insulin aspart  0-5 Units Subcutaneous QHS   methylPREDNISolone (SOLU-MEDROL) injection  0.5 mg/kg Intravenous Q12H   Followed by   Melene Muller ON 05/28/2020] predniSONE  50 mg Oral Daily   prenatal multivitamin  1 tablet Oral Q1200   sodium chloride flush  3 mL Intravenous Q12H   zinc sulfate  220 mg Oral Daily   Continuous Infusions:  sodium chloride     lactated ringers Stopped (05/26/20 0647)   remdesivir 100 mg in NS 100 mL        LOS: 1 day   Marinda Elk  [facility]  05/26/2020, 3:20 PM

## 2020-05-26 NOTE — Lactation Note (Addendum)
This note was copied from a baby's chart. Lactation Consultation Note  Patient Name: Kristy Hines [id number] Date: 05/26/2020   Infant is 35 weeks 3 hrs old <6 lbs. LC spoke with RN in [facility] specialty care who was in the room with Mom. Mom is not feeling well and decided to formula feed at this time.   Mikah Poss  Nicholson-Springer 05/26/2020, 3:22 PM

## 2020-05-26 NOTE — Anesthesia Preprocedure Evaluation (Signed)
Anesthesia Evaluation  Patient identified by MRN, date of birth, ID band Patient awake    Reviewed: Allergy & Precautions, H&P , NPO status , Patient's Chart, lab work & pertinent test results, reviewed documented beta blocker date and time   Airway Mallampati: II  TM Distance: >3 FB Neck ROM: full    Dental no notable dental hx.    Pulmonary neg pulmonary ROS,    Pulmonary exam normal breath sounds clear to auscultation       Cardiovascular negative cardio ROS Normal cardiovascular exam Rhythm:regular Rate:Normal     Neuro/Psych negative neurological ROS  negative psych ROS   GI/Hepatic negative GI ROS, Neg liver ROS,   Endo/Other  Morbid obesity  Renal/GU negative Renal ROS  negative genitourinary   Musculoskeletal   Abdominal   Peds  Hematology negative hematology ROS (+)   Anesthesia Other Findings   Reproductive/Obstetrics (+) Pregnancy                             Anesthesia Physical Anesthesia Plan  ASA: IV and emergent  Anesthesia Plan: Spinal   Post-op Pain Management:    Induction:   PONV Risk Score and Plan: 2  Airway Management Planned: Mask  Additional Equipment:   Intra-op Plan:   Post-operative Plan:   Informed Consent: I have reviewed the patients History and Physical, chart, labs and discussed the procedure including the risks, benefits and alternatives for the proposed anesthesia with the patient or authorized representative who has indicated his/her understanding and acceptance.       Plan Discussed with: Anesthesiologist and CRNA  Anesthesia Plan Comments: (  )        Anesthesia Quick Evaluation

## 2020-05-27 ENCOUNTER — Encounter (HOSPITAL_COMMUNITY): Payer: Self-pay | Admitting: Obstetrics and Gynecology

## 2020-05-27 DIAGNOSIS — U071 COVID-19: Secondary | ICD-10-CM

## 2020-05-27 DIAGNOSIS — J1282 Pneumonia due to coronavirus disease 2019: Secondary | ICD-10-CM

## 2020-05-27 LAB — COMPREHENSIVE METABOLIC PANEL
ALT: 227 U/L — ABNORMAL HIGH (ref 0–44)
AST: 824 U/L — ABNORMAL HIGH (ref 15–41)
Albumin: 1.7 g/dL — ABNORMAL LOW (ref 3.5–5.0)
Alkaline Phosphatase: 165 U/L — ABNORMAL HIGH (ref 38–126)
Anion gap: 9 (ref 5–15)
BUN: 13 mg/dL (ref 6–20)
CO2: 22 mmol/L (ref 22–32)
Calcium: 8.1 mg/dL — ABNORMAL LOW (ref 8.9–10.3)
Chloride: 108 mmol/L (ref 98–111)
Creatinine, Ser: 1.46 mg/dL — ABNORMAL HIGH (ref 0.44–1.00)
GFR calc Af Amer: 51 mL/min — ABNORMAL LOW (ref >60)
GFR calc non Af Amer: 44 mL/min — ABNORMAL LOW (ref >60)
Glucose, Bld: 127 mg/dL — ABNORMAL HIGH (ref 70–99)
Potassium: 4.7 mmol/L (ref 3.5–5.1)
Sodium: 139 mmol/L (ref 135–145)
Total Bilirubin: 0.9 mg/dL (ref 0.3–1.2)
Total Protein: 4.9 g/dL — ABNORMAL LOW (ref 6.5–8.1)

## 2020-05-27 LAB — CBC WITH DIFFERENTIAL/PLATELET
Abs Immature Granulocytes: 0.63 10*3/uL — ABNORMAL HIGH (ref 0.00–0.07)
Basophils Absolute: 0 10*3/uL (ref 0.0–0.1)
Basophils Relative: 0 %
Eosinophils Absolute: 0 10*3/uL (ref 0.0–0.5)
Eosinophils Relative: 0 %
HCT: 37.9 % (ref 36.0–46.0)
Hemoglobin: 11.7 g/dL — ABNORMAL LOW (ref 12.0–15.0)
Immature Granulocytes: 4 %
Lymphocytes Relative: 14 %
Lymphs Abs: 2.2 10*3/uL (ref 0.7–4.0)
MCH: 27.9 pg (ref 26.0–34.0)
MCHC: 30.9 g/dL (ref 30.0–36.0)
MCV: 90.2 fL (ref 80.0–100.0)
Monocytes Absolute: 0.9 10*3/uL (ref 0.1–1.0)
Monocytes Relative: 6 %
Neutro Abs: 11.7 10*3/uL — ABNORMAL HIGH (ref 1.7–7.7)
Neutrophils Relative %: 76 %
Platelets: 209 10*3/uL (ref 150–400)
RBC: 4.2 MIL/uL (ref 3.87–5.11)
RDW: 13.7 % (ref 11.5–15.5)
WBC: 15.5 10*3/uL — ABNORMAL HIGH (ref 4.0–10.5)
nRBC: 0.5 % — ABNORMAL HIGH (ref 0.0–0.2)

## 2020-05-27 LAB — C-REACTIVE PROTEIN: CRP: 8.3 mg/dL — ABNORMAL HIGH (ref <1.0)

## 2020-05-27 LAB — GLUCOSE, CAPILLARY
Glucose-Capillary: 104 mg/dL — ABNORMAL HIGH (ref 70–99)
Glucose-Capillary: 119 mg/dL — ABNORMAL HIGH (ref 70–99)
Glucose-Capillary: 130 mg/dL — ABNORMAL HIGH (ref 70–99)
Glucose-Capillary: 144 mg/dL — ABNORMAL HIGH (ref 70–99)
Glucose-Capillary: 248 mg/dL — ABNORMAL HIGH (ref 70–99)

## 2020-05-27 LAB — MAGNESIUM: Magnesium: 2.5 mg/dL — ABNORMAL HIGH (ref 1.7–2.4)

## 2020-05-27 LAB — PHOSPHORUS: Phosphorus: 4.6 mg/dL (ref 2.5–4.6)

## 2020-05-27 LAB — D-DIMER, QUANTITATIVE: D-Dimer, Quant: 2.74 ug/mL-FEU — ABNORMAL HIGH (ref 0.00–0.50)

## 2020-05-27 MED ORDER — INSULIN DETEMIR 100 UNIT/ML ~~LOC~~ SOLN
10.0000 [IU] | Freq: Two times a day (BID) | SUBCUTANEOUS | Status: DC
  Administered 2020-05-27: 10 [IU] via SUBCUTANEOUS
  Filled 2020-05-27 (×3): qty 0.1

## 2020-05-27 MED ORDER — INSULIN DETEMIR 100 UNIT/ML ~~LOC~~ SOLN
15.0000 [IU] | Freq: Two times a day (BID) | SUBCUTANEOUS | Status: DC
  Administered 2020-05-27 – 2020-05-28 (×2): 15 [IU] via SUBCUTANEOUS
  Filled 2020-05-27 (×5): qty 0.15

## 2020-05-27 MED ORDER — INSULIN ASPART 100 UNIT/ML ~~LOC~~ SOLN
5.0000 [IU] | Freq: Three times a day (TID) | SUBCUTANEOUS | Status: DC
  Administered 2020-05-27 – 2020-05-28 (×5): 5 [IU] via SUBCUTANEOUS

## 2020-05-27 MED ORDER — SODIUM CHLORIDE 0.9 % IV SOLN: INTRAVENOUS | Status: DC

## 2020-05-27 NOTE — Progress Notes (Signed)
Subjective: Postpartum Day 1: Cesarean Delivery Patient reports tolerating PO.  Reports improvement in sob. She denies incisional pain. Minimal lochia. Feels better than yesterday.   Objective: Vital signs in last 24 hours: Temp:  [96.4 F (35.8 C)-97.7 F (36.5 C)] 97.3 F (36.3 C) (09/26 0514) Pulse Rate:  [59-105] 61 (09/26 0514) Resp:  [18-37] 26 (09/26 0514) BP: (93-116)/(55-93) 100/69 (09/26 0514) SpO2:  [79 %-95 %] 91 % (09/26 0514)  Physical Exam:  General: alert, cooperative and no distress Lochia: appropriate Uterine Fundus: firm Incision: bandage clean dry and intact  DVT Evaluation: No evidence of DVT seen on physical exam.  Recent Labs    05/25/20 1418 05/26/20 0549  HGB 12.9 12.8  HCT 41.2 39.9   Results for orders placed or performed during the hospital encounter of 05/25/20 (from the past 24 hour(s))  Glucose, capillary     Status: Abnormal   Collection Time: 05/26/20  5:03 PM  Result Value Ref Range   Glucose-Capillary 199 (H) 70 - 99 mg/dL  Sodium, urine, random     Status: None   Collection Time: 05/26/20  6:03 PM  Result Value Ref Range   Sodium, Ur 20 mmol/L  Creatinine, urine, random     Status: None   Collection Time: 05/26/20  6:03 PM  Result Value Ref Range   Creatinine, Urine 32.59 mg/dL  Glucose, capillary     Status: Abnormal   Collection Time: 05/27/20 12:00 AM  Result Value Ref Range   Glucose-Capillary 248 (H) 70 - 99 mg/dL   Assessment/Plan: Status post Cesarean section. Postoperative course complicated by Covid 19 pneumonia   Covid pneumonia - managed by hospitalist  Pt advised to sit in chair and ambulate as tolerated in room.  She may shower today if she feels up to it. / request shower chair.  Marland Kitchenelevated BS- most likely secondary to steroids - continue insulin Sliding scale   Gerald Leitz 05/27/2020, 7:53 AM

## 2020-05-27 NOTE — Progress Notes (Signed)
[facility] CONSULT PROGRESS NOTE    Progress Note  Kristy Hines  IWP:[id number] DOB: [birth year] DOA: 05/25/2020 PCP: Clayborn Heron, MD     Brief Narrative:   Kristy Hines is an 39 y.o. female G2, P1 35 weeks who comes into the hospital for shortness of breath, her husband  and stepson tested positive for COVID-19 1 week ago she started getting short of breath several days came into the ED was placed on 6 L of oxygen.  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  Hemoglobin of 17 platelet count of 204, her LFTs are improving, further management per OB/GYN.  Acute hypoxemic respiratory failure due to COVID-19 [facility]): She still requiring 2 L oxygen to keep saturations greater than 90%, continue IV steroids and IV remdesivir. Her inflammatory markers are slightly improved today. Except for her D-dimer which is trending up, I will discuss with OB to see if it is okay to start subtherapeutic Lovenox, her D-dimer is 2.7 will need to continue close eye on it. She has a mild leukocytosis that is likely due to steroids. Try to keep the patient prone for at least 16 hours a day if not prone out of bed to chair, continue to use incentive spirometry and flutter valve.  AKI: Her fractional excretion of sodium was less than 1, will continue aggressive IV fluid hydration hold oral antihypertensive medication avoid  nephrotoxic medications (NSAIDS). Check a basic metabolic panel in the morning. Continue to follow strict I's and O's and daily weights. We will have to be judicious with IV fluid hydration as this can compromise her respiratory status.  RN Pressure Injury Documentation:    DVT prophylaxis: SCD Family Communication:None Status is: Inpatient  Remains inpatient appropriate because:Hemodynamically unstable   Dispo: The patient is from: Home              Anticipated d/c is to: Home              Anticipated d/c date is: > 3 days              Patient currently is not medically stable to d/c.        Code Status:     Code Status Orders  (From admission, onward)         Start     Ordered   05/25/20 1605  Full code  Continuous        05/25/20 1607        Code Status History    Date Active Date Inactive Code Status Order ID Comments User Context   05/25/2020 1401 05/25/2020 1607 Full Code [id number]  Myna Hidalgo, DO Inpatient   03/01/2019 0703 03/03/2019 1705 Full Code [id number]  Myna Hidalgo, DO Inpatient   02/28/2019 0013 02/28/2019 2302 Full Code [id number]  Myna Hidalgo, DO Inpatient   04/19/2018 1859 04/22/2018 1816 Full Code [id number]  Alessandra Bevels, MD ED   Advance Care Planning Activity  IV Access:    Peripheral IV   Procedures and diagnostic studies:   DG CHEST PORT 1 VIEW  Result Date: 05/25/2020 CLINICAL DATA:  Shortness of breath.  COVID-19 positive EXAM: PORTABLE CHEST 1 VIEW COMPARISON:  April 17, 2018 FINDINGS: There is airspace opacity consistent with pneumonia in the right lower lung region. Lungs elsewhere clear. Heart size and pulmonary vascularity are normal. No adenopathy. No bone lesions. IMPRESSION: Airspace opacity right base consistent with pneumonia. COVID-19 pneumonia potentially could present in this manner, although bacterial pneumonia could present similarly. Lungs elsewhere clear. Cardiac silhouette normal. No adenopathy.  Electronically Signed   By: Bretta Bang III M.D.   On: 05/25/2020 13:24     Medical Consultants:    None.  Anti-Infectives:   IV remdesivir  Subjective:    Kristy Hines she is still anorexic, complaining of pain.  She relates her breathing is about the same as yesterday.  Objective:    Vitals:   05/26/20 2353 05/27/20 0514 05/27/20 0835 05/27/20 0900  BP: 110/72 100/69 108/71   Pulse: 74 61 65   Resp: (!) 27 (!) 26 (!) 22   Temp: 97.6 F (36.4 C) (!) 97.3 F (36.3 C)    TempSrc: Axillary Axillary    SpO2: 91% 91% 90% (!) 88%  Weight:      Height:       SpO2: (!) 88 % O2 Flow Rate (L/min): 10 L/min   Intake/Output Summary (Last 24 hours) at 05/27/2020 1052 Last data filed at 05/27/2020 0700 Gross per 24 hour  Intake 5952.32 ml  Output 2336 ml  Net 3616.32 ml   Filed Weights   05/25/20 1754  Weight: 106.6 kg    Exam: General exam: In no acute distress. Respiratory system: Good air movement with diffuse crackles Cardiovascular system: S1 & S2 heard, RRR.  No JVD Gastrointestinal system: Abdomen is nondistended, soft and nontender.  Extremities: No pedal edema. Skin: No rashes, lesions or ulcers Psychiatry: Judgement and insight appear normal. Mood & affect appropriate.   Data Reviewed:    Labs: Basic Metabolic Panel: Recent Labs  Lab 05/25/20 1418 05/25/20 1418 05/26/20 0549 05/27/20 0720  NA 139  --  140 139  K 4.3   < > 4.4 4.7  CL 108  --  111 108  CO2 19*  --  17* 22  GLUCOSE 102*  --  132* 127*  BUN 9  --  10 13  CREATININE 1.46*  --  1.40* 1.46*  CALCIUM 7.8*  --  8.1* 8.1*  MG  --   --  2.3 2.5*  PHOS  --   --  5.0* 4.6   < > = values in this interval not displayed.   GFR Estimated Creatinine Clearance: 61.5 mL/min (A) (by C-G formula based on SCr of 1.46 mg/dL (H)). Liver Function Tests: Recent Labs  Lab 05/25/20 1418 05/26/20 0549 05/27/20 0720  AST 542* 1,158* 824*  ALT 146* 267* 227*  ALKPHOS 190* 201* 165*    BILITOT 0.8 1.1 0.9  PROT 5.5* 5.5* 4.9*  ALBUMIN 2.0* 2.0* 1.7*   No results for input(s): LIPASE, AMYLASE in the last 168 hours. No results for input(s): AMMONIA in the last 168 hours. Coagulation profile No results for input(s): INR, PROTIME in the last 168 hours. COVID-19 Labs  Recent Labs    05/25/20 1418 05/26/20 0549 05/27/20 0720  DDIMER 1.56* 1.37* 2.74*  FERRITIN 976* 1,675*  --   LDH 708*  --   --  CRP 14.5* 12.9* 8.3*    Lab Results  Component Value Date   SARSCOV2NAA NEGATIVE 02/18/2020   SARSCOV2NAA NEGATIVE 02/24/2019    CBC: Recent Labs  Lab 05/25/20 1418 05/26/20 0549 05/27/20 0720  WBC 7.1 7.6 15.5*  NEUTROABS 6.2 6.9 11.7*  HGB 12.9 12.8 11.7*  HCT 41.2 39.9 37.9  MCV 89.6 89.7 90.2  PLT 147* 156 209   Cardiac Enzymes: No results for input(s): CKTOTAL, CKMB, CKMBINDEX, TROPONINI in the last 168 hours. BNP (last 3 results) No results for input(s): PROBNP in the last 8760 hours. CBG: Recent Labs  Lab 05/25/20 2228 05/26/20 1703 05/27/20 0000 05/27/20 0924  GLUCAP 105* 199* 248* 104*   D-Dimer: Recent Labs    05/26/20 0549 05/27/20 0720  DDIMER 1.37* 2.74*   Hgb A1c: No results for input(s): HGBA1C in the last 72 hours. Lipid Profile: No results for input(s): CHOL, HDL, LDLCALC, TRIG, CHOLHDL, LDLDIRECT in the last 72 hours. Thyroid function studies: No results for input(s): TSH, T4TOTAL, T3FREE, THYROIDAB in the last 72 hours.  Invalid input(s): FREET3 Anemia work up: Recent Labs    05/25/20 1418 05/26/20 0549  FERRITIN 976* 1,675*   Sepsis Labs: Recent Labs  Lab 05/25/20 1418 05/26/20 0549 05/27/20 0720  PROCALCITON 0.83  --   --   WBC 7.1 7.6 15.5*   Microbiology No results found for this or any previous visit (from the past 240 hour(s)).   Medications:   . vitamin C  500 mg Oral Daily  . enoxaparin (LOVENOX) injection  50 mg Subcutaneous Q24H  . insulin aspart  0-15 Units Subcutaneous TID WC  . insulin  aspart  0-5 Units Subcutaneous QHS  . insulin aspart  5 Units Subcutaneous TID WC  . insulin detemir  10 Units Subcutaneous BID  . methylPREDNISolone (SOLU-MEDROL) injection  80 mg Intravenous Q12H  . prenatal multivitamin  1 tablet Oral Q1200  . senna-docusate  2 tablet Oral Q24H  . simethicone  80 mg Oral TID PC  . simethicone  80 mg Oral Q24H  . sodium chloride flush  3 mL Intravenous Q12H  . zinc sulfate  220 mg Oral Daily   Continuous Infusions: . sodium chloride    . sodium chloride 100 mL/hr at 05/27/20 0700  . remdesivir 100 mg in NS 100 mL Stopped (05/26/20 2145)      LOS: 2 days   Marinda Elk  [facility]  05/27/2020, 10:52 AM

## 2020-05-27 NOTE — Lactation Note (Signed)
This note was copied from a baby's chart. Lactation Consultation Note  Patient Name: Kristy Hines [id number] Date: 05/27/2020 Reason for consult: Follow-up assessment Infant will be joining mom today.  Mom reports she does not feel like breastfeeding or pumping at this time.  Mom reports she plans to and will start when she feels better.  Urged to call lactation as needed.  Maternal Data    Feeding Feeding Type: Bottle Fed - Formula Nipple Type: Slow - flow  LATCH Score                   Interventions Interventions: DEBP  Lactation Tools Discussed/Used     Consult Status Consult Status: PRN    Neomia Dear 05/27/2020, 2:56 PM

## 2020-05-27 NOTE — Progress Notes (Signed)
@   2215 RN educated pt and pt's family on prone positioning for covid pts, and discussed ways it can be used for a post c/s pt. Pt was not sure she would be comfortable or able to do this position due to her pain level and energy effort. She stated she was concerned about the pulling on her incision and pressure it would place on her incision. RN reassured pt that we can attempt it to her comfort and ability. Pt agreed and RN assisted pt to be placed in a modified left sims position with mod assistance and pt tolerated it fairly. While in that mod prone position pt O2 Sat increased to 95% on HFNC 10L, and stayed consistent at 91-92%. Pt was able to stay in that position for 25 mins and asked to be positioned back to semi-fowlers to finish eating. RN encouraged pt and pt's family to reposition in to the mod left sims as often as can, at least 2 hours at a time and while sleeping. Pt stated she may feel more comfort doing when pain is more tolerable. RN will continue to offer reposition and encourage pt.

## 2020-05-27 NOTE — Progress Notes (Signed)
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  Decision to intubate based on clinical gestalt, WOB, and mental status as Dr. Robb Matar is already an expert in.  Please reach out if we can be of assistance.  PCCM available PRN.  Myrla Halsted MD PCCM

## 2020-05-28 ENCOUNTER — Encounter (HOSPITAL_COMMUNITY): Payer: Self-pay | Admitting: Internal Medicine

## 2020-05-28 LAB — COMPREHENSIVE METABOLIC PANEL
ALT: 134 U/L — ABNORMAL HIGH (ref 0–44)
AST: 223 U/L — ABNORMAL HIGH (ref 15–41)
Albumin: 1.5 g/dL — ABNORMAL LOW (ref 3.5–5.0)
Alkaline Phosphatase: 134 U/L — ABNORMAL HIGH (ref 38–126)
Anion gap: 7 (ref 5–15)
BUN: 19 mg/dL (ref 6–20)
CO2: 23 mmol/L (ref 22–32)
Calcium: 7.9 mg/dL — ABNORMAL LOW (ref 8.9–10.3)
Chloride: 110 mmol/L (ref 98–111)
Creatinine, Ser: 1.38 mg/dL — ABNORMAL HIGH (ref 0.44–1.00)
GFR calc Af Amer: 55 mL/min — ABNORMAL LOW (ref >60)
GFR calc non Af Amer: 47 mL/min — ABNORMAL LOW (ref >60)
Glucose, Bld: 115 mg/dL — ABNORMAL HIGH (ref 70–99)
Potassium: 4.3 mmol/L (ref 3.5–5.1)
Sodium: 140 mmol/L (ref 135–145)
Total Bilirubin: 0.5 mg/dL (ref 0.3–1.2)
Total Protein: 4.3 g/dL — ABNORMAL LOW (ref 6.5–8.1)

## 2020-05-28 LAB — CBC WITH DIFFERENTIAL/PLATELET
Abs Immature Granulocytes: 0.94 10*3/uL — ABNORMAL HIGH (ref 0.00–0.07)
Basophils Absolute: 0.1 10*3/uL (ref 0.0–0.1)
Basophils Relative: 0 %
Eosinophils Absolute: 0 10*3/uL (ref 0.0–0.5)
Eosinophils Relative: 0 %
HCT: 33.2 % — ABNORMAL LOW (ref 36.0–46.0)
Hemoglobin: 10.3 g/dL — ABNORMAL LOW (ref 12.0–15.0)
Immature Granulocytes: 5 %
Lymphocytes Relative: 11 %
Lymphs Abs: 2.2 10*3/uL (ref 0.7–4.0)
MCH: 28.4 pg (ref 26.0–34.0)
MCHC: 31 g/dL (ref 30.0–36.0)
MCV: 91.5 fL (ref 80.0–100.0)
Monocytes Absolute: 1.4 10*3/uL — ABNORMAL HIGH (ref 0.1–1.0)
Monocytes Relative: 7 %
Neutro Abs: 15.6 10*3/uL — ABNORMAL HIGH (ref 1.7–7.7)
Neutrophils Relative %: 77 %
Platelets: 201 10*3/uL (ref 150–400)
RBC: 3.63 MIL/uL — ABNORMAL LOW (ref 3.87–5.11)
RDW: 13.8 % (ref 11.5–15.5)
WBC: 20.1 10*3/uL — ABNORMAL HIGH (ref 4.0–10.5)
nRBC: 0.4 % — ABNORMAL HIGH (ref 0.0–0.2)

## 2020-05-28 LAB — GLUCOSE, CAPILLARY
Glucose-Capillary: 119 mg/dL — ABNORMAL HIGH (ref 70–99)
Glucose-Capillary: 135 mg/dL — ABNORMAL HIGH (ref 70–99)
Glucose-Capillary: 109 mg/dL — ABNORMAL HIGH (ref 70–99)
Glucose-Capillary: 52 mg/dL — ABNORMAL LOW (ref 70–99)
Glucose-Capillary: 59 mg/dL — ABNORMAL LOW (ref 70–99)
Glucose-Capillary: 85 mg/dL (ref 70–99)

## 2020-05-28 LAB — MAGNESIUM: Magnesium: 2.4 mg/dL (ref 1.7–2.4)

## 2020-05-28 LAB — D-DIMER, QUANTITATIVE: D-Dimer, Quant: 3.19 ug/mL-FEU — ABNORMAL HIGH (ref 0.00–0.50)

## 2020-05-28 LAB — C-REACTIVE PROTEIN: CRP: 3.8 mg/dL — ABNORMAL HIGH (ref <1.0)

## 2020-05-28 LAB — PHOSPHORUS: Phosphorus: 4.3 mg/dL (ref 2.5–4.6)

## 2020-05-28 MED ORDER — ENOXAPARIN SODIUM 60 MG/0.6ML ~~LOC~~ SOLN
50.0000 mg | Freq: Two times a day (BID) | SUBCUTANEOUS | Status: DC
  Administered 2020-05-28 – 2020-05-29 (×2): 50 mg via SUBCUTANEOUS
  Filled 2020-05-28 (×2): qty 0.6

## 2020-05-28 MED ORDER — SODIUM CHLORIDE 0.9 % IV SOLN: INTRAVENOUS | Status: AC

## 2020-05-28 NOTE — Progress Notes (Signed)
RN called w/ pre-meal CBG of 52 and 59 after intervention. Post meal CBG 85. Levemir 15 Units scheduled at HS. Consulted w/ Dr. Su Hilt and plan developed to hold 2200 dose, CBG Q 4hrs overnight and Levemir 4 Units for BG >140.   Rhea Pink, MSN, CNM 05/28/2020 11:03 PM

## 2020-05-28 NOTE — Progress Notes (Signed)
[facility] CONSULT PROGRESS NOTE    Progress Note  Kristy Hines  QHU:[id number] DOB: [birth year] DOA: 05/25/2020 PCP: Aretta Nip, MD     Brief Narrative:   Kristy Hines is an 39 y.o. female G2, P1 35 weeks who comes into the hospital for shortness of breath, her husband  and stepson tested positive for COVID-19 1 week ago she started getting short of breath several days came into the ED was placed on 6 L of oxygen.  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  Hemoglobin of 10.3 platelet count of 204, her LFTs are improving, further management per OB/GYN.  Acute hypoxemic respiratory failure due to COVID-19 [facility]): She still requiring 10 L HFNC  to keep saturations greater than 90%, continue IV steroids and IV remdesivir. Inflamatory markers are improved including her D-dimer. Recheck D-dimer in am. We can back down on her lovenox prophylactic dose. Leukocytosis is improving. Try to keep the patient prone for at least 16 hours a day if not prone out of bed to chair, encourage incentive spirometry and flutter valve. Everything seems to be cooling down. Which is a good sign. Will need to be ambulated. Try to wean down her oxygen.  AKI: With fractional excretion of sodium of less than 1. Improved with IV fluids. Will KVO IV fluids now, will see if she autodiuresis. Cont to hold antihypertensive  medications. Avoid NSAID's b-met in am. Strict I and O's Daily weight.  Transaminitis: Improving. See above.    DVT prophylaxis: SCD Family Communication:None Status is: Inpatient  Remains inpatient appropriate because:Hemodynamically unstable   Dispo: The patient is from: Home              Anticipated d/c is to: Home              Anticipated d/c date is: > 3 days              Patient currently is not medically stable to d/c.   Code Status:     Code Status Orders  (From admission, onward)         Start     Ordered   05/25/20 1605  Full code  Continuous        05/25/20 1607        Code Status History    Date Active Date Inactive Code Status Order ID Comments User Context   05/25/2020 1401 05/25/2020 1607 Full Code [id number]  Janyth Pupa, DO Inpatient   03/01/2019 0703 03/03/2019 1705 Full Code [id number]  Janyth Pupa, DO Inpatient   02/28/2019 0013 02/28/2019 2302 Full Code [id number]  Janyth Pupa, DO Inpatient   04/19/2018 1859 04/22/2018 1816 Full Code [id number]  Guilford Shi, MD ED   Advance Care Planning Activity        IV Access:    Peripheral IV   Procedures and diagnostic studies:   No results found.   Medical Consultants:    None.  Anti-Infectives:   IV remdesivir  Subjective:    Kristy Hines she related her breathing is better.  Objective:    Vitals:   05/28/20 0524 05/28/20 0600 05/28/20 0700 05/28/20 0826  BP:    100/65  Pulse: (!) 54   (!) 46  Resp:    20  Temp:    97.6 F (36.4 C)  TempSrc:    Oral  SpO2:  (!) 88% 92% 92%  Weight:      Height:       SpO2: 92 % O2 Flow Rate (L/min): 10 L/min   Intake/Output Summary (Last 24 hours) at 05/28/2020 1040 Last data filed at 05/28/2020 0600 Gross per 24 hour  Intake 1633.29 ml  Output 600 ml  Net 1033.29 ml   Filed Weights   05/25/20 1754  Weight: 106.6 kg    Exam: General exam: In no acute distress. Respiratory system: Good air movement with diffuse  crackles Cardiovascular system: S1 & S2 heard, RRR.  No JVD Gastrointestinal system: Abdomen is nondistended, soft and nontender.  Extremities: No pedal edema. Skin: No rashes, lesions or ulcers Psychiatry: Judgement and insight appear normal. Mood & affect appropriate.   Data Reviewed:    Labs: Basic Metabolic Panel: Recent Labs  Lab 05/25/20 1418 05/25/20 1418 05/26/20 0549 05/26/20 0549 05/27/20 0720 05/28/20 0531  NA 139  --  140  --  139 140  K 4.3   < > 4.4   < > 4.7 4.3  CL 108  --  111  --  108 110  CO2 19*  --  17*  --  22 23  GLUCOSE 102*  --  132*  --  127* 115*  BUN 9  --  10  --  13 19  CREATININE 1.46*  --  1.40*  --  1.46* 1.38*  CALCIUM 7.8*  --  8.1*  --  8.1* 7.9*  MG  --   --  2.3  --  2.5* 2.4  PHOS  --   --  5.0*  --  4.6 4.3   < > = values in this interval not displayed.   GFR Estimated Creatinine Clearance: 65 mL/min (A) (by C-G formula based on SCr of 1.38 mg/dL (H)). Liver Function Tests: Recent Labs  Lab 05/25/20 1418 05/26/20 0549 05/27/20 0720 05/28/20 0531  AST 542* 1,158* 824* 223*  ALT 146* 267* 227* 134*  ALKPHOS 190* 201* 165* 134*  BILITOT 0.8 1.1 0.9 0.5  PROT 5.5* 5.5* 4.9* 4.3*  ALBUMIN 2.0* 2.0* 1.7* 1.5*   No results for input(s): LIPASE, AMYLASE in the last 168 hours. No results for input(s): AMMONIA in the last 168 hours. Coagulation profile No results for input(s): INR, PROTIME in the last 168 hours. COVID-19 Labs  Recent Labs    05/25/20 1418 05/25/20 1418 05/26/20 0549 05/27/20 0720 05/28/20 0531  DDIMER 1.56*   < > 1.37* 2.74* 3.19*  FERRITIN 976*  --  1,675*  --   --   LDH 708*  --   --   --   --   CRP 14.5*   < > 12.9* 8.3* 3.8*   < > = values in this interval not displayed.    Lab Results  Component Value Date   SARSCOV2NAA NEGATIVE 02/18/2020   [place] NEGATIVE 02/24/2019    CBC: Recent Labs  Lab 05/25/20 1418 05/26/20 0549 05/27/20 0720 05/28/20 0531  WBC 7.1 7.6 15.5* 20.1*    NEUTROABS 6.2 6.9 11.7* 15.6*  HGB 12.9 12.8 11.7* 10.3*  HCT  41.2 39.9 37.9 33.2*  MCV 89.6 89.7 90.2 91.5  PLT 147* 156 209 201   Cardiac Enzymes: No results for input(s): CKTOTAL, CKMB, CKMBINDEX, TROPONINI in the last 168 hours. BNP (last 3 results) No results for input(s): PROBNP in the last 8760 hours. CBG: Recent Labs  Lab 05/27/20 0924 05/27/20 1223 05/27/20 1715 05/27/20 2237 05/28/20 1011  GLUCAP 104* 130* 144* 119* 135*   D-Dimer: Recent Labs    05/27/20 0720 05/28/20 0531  DDIMER 2.74* 3.19*   Hgb A1c: No results for input(s): HGBA1C in the last 72 hours. Lipid Profile: No results for input(s): CHOL, HDL, LDLCALC, TRIG, CHOLHDL, LDLDIRECT in the last 72 hours. Thyroid function studies: No results for input(s): TSH, T4TOTAL, T3FREE, THYROIDAB in the last 72 hours.  Invalid input(s): FREET3 Anemia work up: Recent Labs    05/25/20 1418 05/26/20 0549  FERRITIN 976* 1,675*   Sepsis Labs: Recent Labs  Lab 05/25/20 1418 05/26/20 0549 05/27/20 0720 05/28/20 0531  PROCALCITON 0.83  --   --   --   WBC 7.1 7.6 15.5* 20.1*   Microbiology No results found for this or any previous visit (from the past 240 hour(s)).   Medications:    vitamin C  500 mg Oral Daily   enoxaparin (LOVENOX) injection  50 mg Subcutaneous Q24H   insulin aspart  0-15 Units Subcutaneous TID WC   insulin aspart  0-5 Units Subcutaneous QHS   insulin aspart  5 Units Subcutaneous TID WC   insulin detemir  15 Units Subcutaneous BID   methylPREDNISolone (SOLU-MEDROL) injection  80 mg Intravenous Q12H   prenatal multivitamin  1 tablet Oral Q1200   senna-docusate  2 tablet Oral Q24H   simethicone  80 mg Oral TID PC   simethicone  80 mg Oral Q24H   sodium chloride flush  3 mL Intravenous Q12H   zinc sulfate  220 mg Oral Daily   Continuous Infusions:  sodium chloride     sodium chloride 100 mL/hr at 05/28/20 5830   remdesivir 100 mg in NS 100 mL 100 mg (05/27/20  2010)      LOS: 3 days   Charlynne Cousins  [facility]  05/28/2020, 10:40 AM

## 2020-05-28 NOTE — Progress Notes (Addendum)
Postpartum Note Day #2  S:  Patient is doing okay.  Husband and infant at bedside. Breathing is improved but still on 10L O2.  Pain is well-controlled.  Ambulating to bedside commode and voiding without difficulty.  Tolerating a reulgar diet without any N/V.  Has not yet passed flatus or had a BM.  Denies fevers, chills, chest pain, upper abdominal pain, or worsening bilateral LE edema.  Lochia: Minimal Infant feeding:  Bottle Circumcision:  Desires Contraception:  Not addressed today  O: Temp:  [97.6 F (36.4 C)-97.8 F (36.6 C)] 97.6 F (36.4 C) (09/27 0523) Pulse Rate:  [47-73] 54 (09/27 0524) Resp:  [17-22] 20 (09/27 0523) BP: (93-125)/(63-99) 107/67 (09/27 0523) SpO2:  [71 %-95 %] 92 % (09/27 0700) Gen: NAD, pleasant and cooperative Resp: 10L [place], no increased work of breathing Abdomen: soft, non-distended, non-tender throughout Uterus: firm, non-tender, below umbilicus Incision: island dressing removed - honeycomb dressing intact without any drainage or erythema surrouding Ext: 1+ bilateral LE edema, no bilateral calf tenderness, SCDs placed and turned on  Labs: Recent Labs    05/27/20 0720 05/28/20 0531  HGB 11.7* 10.3*    A/P: Pt is a 39 y.o. [id number] POD#2 s/p primary LTCS for respiratory distress secondary to COVID PNA.  S/p LTCS - Pain well controlled  - GU: Voiding spontaneously on bedside commode - GI: Tolerating regular diet - Activity: encouraged sitting up to chair and ambulation as tolerated - Prophylaxis: Subtherapeutic Lovenox - Labs: stable as above  COVID PNA - Managed by hospitalist team - Currently on Remdesivir and solumedrol - Transaminitis and AKI thought secondary to COVID, downtrending  Rh negative - Rhogam ordered  Disposition:  Pending respiratory status and hospitalist recommendations.  Steva Ready, DO [phone] (office)

## 2020-05-28 NOTE — Progress Notes (Signed)
CBG 85

## 2020-05-28 NOTE — Progress Notes (Signed)
New freestyle libre applied as previous one was "loose" per meter.

## 2020-05-28 NOTE — Progress Notes (Signed)
Freestyle libre applied.

## 2020-05-28 NOTE — Progress Notes (Signed)
Hypoglycemic Event  CBG: 52  Treatment: Juice and dinner tray  Symptoms: None  Follow-up CBG: Time: 2130 CBG Result: 59  Possible Reasons for Event: Lack of nutrition. Patient starting dinner tray now, will recheck    Kristy Hines J Keylani Perlstein

## 2020-05-28 NOTE — Progress Notes (Signed)
Pt back in bed after sitting up in chair for 2 hours. Pt had shortness of breath and was coughing during the transfer with o2 saturation dropping down to 79% on 10L HFNC. After coughing and catching her breath, pt's o2 sats currently at 90% on 10L HFNC.

## 2020-05-29 LAB — COMPREHENSIVE METABOLIC PANEL
ALT: 90 U/L — ABNORMAL HIGH (ref 0–44)
AST: 82 U/L — ABNORMAL HIGH (ref 15–41)
Albumin: 1.5 g/dL — ABNORMAL LOW (ref 3.5–5.0)
Alkaline Phosphatase: 122 U/L (ref 38–126)
Anion gap: 10 (ref 5–15)
BUN: 23 mg/dL — ABNORMAL HIGH (ref 6–20)
CO2: 20 mmol/L — ABNORMAL LOW (ref 22–32)
Calcium: 7.7 mg/dL — ABNORMAL LOW (ref 8.9–10.3)
Chloride: 110 mmol/L (ref 98–111)
Creatinine, Ser: 1.1 mg/dL — ABNORMAL HIGH (ref 0.44–1.00)
GFR calc Af Amer: >60 mL/min (ref >60)
GFR calc non Af Amer: >60 mL/min (ref >60)
Glucose, Bld: 94 mg/dL (ref 70–99)
Potassium: 3.8 mmol/L (ref 3.5–5.1)
Sodium: 140 mmol/L (ref 135–145)
Total Bilirubin: 0.7 mg/dL (ref 0.3–1.2)
Total Protein: 4.2 g/dL — ABNORMAL LOW (ref 6.5–8.1)

## 2020-05-29 LAB — TYPE AND SCREEN
ABO/RH(D): O NEG
Antibody Screen: POSITIVE
Donor AG Type: NEGATIVE
Donor AG Type: NEGATIVE
Unit division: 0
Unit division: 0

## 2020-05-29 LAB — CBC WITH DIFFERENTIAL/PLATELET
Abs Immature Granulocytes: 0 10*3/uL (ref 0.00–0.07)
Basophils Absolute: 0 10*3/uL (ref 0.0–0.1)
Basophils Relative: 0 %
Eosinophils Absolute: 0 10*3/uL (ref 0.0–0.5)
Eosinophils Relative: 0 %
HCT: 33.5 % — ABNORMAL LOW (ref 36.0–46.0)
Hemoglobin: 10.6 g/dL — ABNORMAL LOW (ref 12.0–15.0)
Lymphocytes Relative: 2 %
Lymphs Abs: 0.3 10*3/uL — ABNORMAL LOW (ref 0.7–4.0)
MCH: 28.7 pg (ref 26.0–34.0)
MCHC: 31.6 g/dL (ref 30.0–36.0)
MCV: 90.8 fL (ref 80.0–100.0)
Monocytes Absolute: 0.7 10*3/uL (ref 0.1–1.0)
Monocytes Relative: 4 %
Neutro Abs: 15.3 10*3/uL — ABNORMAL HIGH (ref 1.7–7.7)
Neutrophils Relative %: 94 %
Platelets: 213 10*3/uL (ref 150–400)
RBC: 3.69 MIL/uL — ABNORMAL LOW (ref 3.87–5.11)
RDW: 13.9 % (ref 11.5–15.5)
WBC: 16.3 10*3/uL — ABNORMAL HIGH (ref 4.0–10.5)
nRBC: 0.7 % — ABNORMAL HIGH (ref 0.0–0.2)
nRBC: 1 /100 WBC — ABNORMAL HIGH

## 2020-05-29 LAB — GLUCOSE, CAPILLARY
Glucose-Capillary: 107 mg/dL — ABNORMAL HIGH (ref 70–99)
Glucose-Capillary: 98 mg/dL (ref 70–99)
Glucose-Capillary: 60 mg/dL — ABNORMAL LOW (ref 70–99)
Glucose-Capillary: 95 mg/dL (ref 70–99)
Glucose-Capillary: 104 mg/dL — ABNORMAL HIGH (ref 70–99)

## 2020-05-29 LAB — D-DIMER, QUANTITATIVE: D-Dimer, Quant: 1.67 ug/mL-FEU — ABNORMAL HIGH (ref 0.00–0.50)

## 2020-05-29 LAB — BPAM RBC
Blood Product Expiration Date: 202110052359
Blood Product Expiration Date: 202110122359
Unit Type and Rh: 9500
Unit Type and Rh: 9500

## 2020-05-29 LAB — MAGNESIUM: Magnesium: 2.1 mg/dL (ref 1.7–2.4)

## 2020-05-29 LAB — C-REACTIVE PROTEIN: CRP: 1.9 mg/dL — ABNORMAL HIGH (ref <1.0)

## 2020-05-29 LAB — PHOSPHORUS: Phosphorus: 3.9 mg/dL (ref 2.5–4.6)

## 2020-05-29 MED ORDER — ENOXAPARIN SODIUM 60 MG/0.6ML ~~LOC~~ SOLN
50.0000 mg | SUBCUTANEOUS | Status: DC
  Administered 2020-05-30 – 2020-06-05 (×7): 50 mg via SUBCUTANEOUS
  Filled 2020-05-29 (×7): qty 0.6

## 2020-05-29 MED ORDER — INSULIN DETEMIR 100 UNIT/ML ~~LOC~~ SOLN
5.0000 [IU] | Freq: Two times a day (BID) | SUBCUTANEOUS | Status: DC
  Filled 2020-05-29 (×2): qty 0.05

## 2020-05-29 NOTE — Progress Notes (Signed)
Hypoglycemic Event  CBG: 60  Treatment: apple juice given. Patient starting breakfast (french toast, potatoes, bacon)  Symptoms: none  Follow-up CBG: repeated after juice finished and breakfast eaten at 1148. CBG 95  See MD note and orders for insulin changes    Javad Salva M Halim Surrette

## 2020-05-29 NOTE — Progress Notes (Signed)
Postpartum Note Day #3  S:  Pt awoken this am, feeling very tired.  States her pain is manageable- denies pain currently.  Denies fever or chills.  No nausea/vomiting.  Tolerating gen diet.  +flatus, no BM.  Still on 10L HFNC-denies SOB, reports difficulty with movement.   O: Temp:  [97.6 F (36.4 C)-97.9 F (36.6 C)] 97.9 F (36.6 C) (09/28 0549) Pulse Rate:  [46-61] 58 (09/28 0549) Resp:  [18-21] 19 (09/28 0549) BP: (95-104)/(53-65) 98/56 (09/28 0549) SpO2:  [87 %-96 %] 93 % (09/28 0615)   Gen: NAD CV: RRR Resp: 10L [place], no increased work of breathing Abdomen: soft, non-distended, non-tender throughout, +BS Uterus: firm, non-tender, below umbilicus Incision:honeycomb dressing intact without any drainage or erythema surrouding Ext: 1+ bilateral LE edema, no bilateral calf tenderness  Labs:  Recent Labs    05/27/20 0720 05/28/20 0531  HGB 11.7* 10.3*    A/P: Pt is a 39 y.o. [id number] POD#3 s/p primary LTCS for respiratory distress secondary to COVID PNA.  S/p LTCS - Pain well controlled  - GU: Voiding spontaneously in bedside commode - GI: Tolerating regular diet - Activity: working on prone position and activity as tolerated - Prophylaxis: Subtherapeutic Lovenox -baby boy circ desired  COVID PNA - Managed by hospitalist team - Currently on Remdesivir and solumedrol - Transaminitis and AKI thought secondary to COVID, downtrending - Accuchecks low overnight- insulin held overnight- will discuss with hospitalist discontinuing scheduled dose and continue with sliding scale only  Rh negative - Rhogam ordered  Disposition:  Pending respiratory status and hospitalist recommendations.  Myna Hidalgo, DO [phone] (cell) [phone] (office)

## 2020-05-29 NOTE — Progress Notes (Signed)
Inpatient Diabetes Program Recommendations  AACE/ADA: New Consensus Statement on Inpatient Glycemic Control (2015)  Target Ranges:  Prepandial:   less than 140 mg/dL      Peak postprandial:   less than 180 mg/dL (1-2 hours)      Critically ill patients:  140 - 180 mg/dL   Lab Results  Component Value Date   GLUCAP 98 05/29/2020    Review of Glycemic Control Results for ADAHLIA, STEMBRIDGE (MRN [id number]) as of 05/29/2020 09:52  Ref. Range 05/28/2020 20:58 05/28/2020 21:30 05/28/2020 22:00 05/29/2020 02:21  Glucose-Capillary Latest Ref Range: 70 - 99 mg/dL 52 (L) 59 (L) 85 660 (H)    Current orders for Inpatient glycemic control: Levemir 5 units BID, Novolog 5 units TID, Novolog 0-15 units TID, Novolog 0-5 units QHS Solumedrol 80 mg BID  Inpatient Diabetes Program Recommendations:    Noted hypoglycemia of 52 mg/dL yesterday following meal coverage. Missed QHS dose of Levemir. Noted insulin order changes were decreased. May also want to discontinue Novolog 5 units TID.   Thanks, Lujean Rave, MSN, RNC-OB Diabetes Coordinator [phone] (8a-5p)

## 2020-05-29 NOTE — Progress Notes (Addendum)
Patient unable to tolerate prone positioning d/t incision pain. RN offered PRN pain medication but patient refused. Patient tired from bedside commode use and would like to rest at this time.

## 2020-05-29 NOTE — Progress Notes (Signed)
Flutter Valve received. Patient educated and demonstrated on how to use device.

## 2020-05-30 LAB — D-DIMER, QUANTITATIVE: D-Dimer, Quant: 1.43 ug/mL-FEU — ABNORMAL HIGH (ref 0.00–0.50)

## 2020-05-30 LAB — CBC WITH DIFFERENTIAL/PLATELET
Abs Immature Granulocytes: 0.86 10*3/uL — ABNORMAL HIGH (ref 0.00–0.07)
Basophils Absolute: 0 10*3/uL (ref 0.0–0.1)
Basophils Relative: 0 %
Eosinophils Absolute: 0 10*3/uL (ref 0.0–0.5)
Eosinophils Relative: 0 %
HCT: 35.6 % — ABNORMAL LOW (ref 36.0–46.0)
Hemoglobin: 11.4 g/dL — ABNORMAL LOW (ref 12.0–15.0)
Immature Granulocytes: 6 %
Lymphocytes Relative: 15 %
Lymphs Abs: 2.1 10*3/uL (ref 0.7–4.0)
MCH: 28.1 pg (ref 26.0–34.0)
MCHC: 32 g/dL (ref 30.0–36.0)
MCV: 87.7 fL (ref 80.0–100.0)
Monocytes Absolute: 1.1 10*3/uL — ABNORMAL HIGH (ref 0.1–1.0)
Monocytes Relative: 7 %
Neutro Abs: 10.6 10*3/uL — ABNORMAL HIGH (ref 1.7–7.7)
Neutrophils Relative %: 72 %
Platelets: 235 10*3/uL (ref 150–400)
RBC: 4.06 MIL/uL (ref 3.87–5.11)
RDW: 13.6 % (ref 11.5–15.5)
WBC: 14.7 10*3/uL — ABNORMAL HIGH (ref 4.0–10.5)
nRBC: 0.6 % — ABNORMAL HIGH (ref 0.0–0.2)

## 2020-05-30 LAB — COMPREHENSIVE METABOLIC PANEL
ALT: 69 U/L — ABNORMAL HIGH (ref 0–44)
AST: 44 U/L — ABNORMAL HIGH (ref 15–41)
Albumin: 1.6 g/dL — ABNORMAL LOW (ref 3.5–5.0)
Alkaline Phosphatase: 125 U/L (ref 38–126)
Anion gap: 10 (ref 5–15)
BUN: 18 mg/dL (ref 6–20)
CO2: 23 mmol/L (ref 22–32)
Calcium: 7.9 mg/dL — ABNORMAL LOW (ref 8.9–10.3)
Chloride: 106 mmol/L (ref 98–111)
Creatinine, Ser: 0.93 mg/dL (ref 0.44–1.00)
GFR calc Af Amer: >60 mL/min (ref >60)
GFR calc non Af Amer: >60 mL/min (ref >60)
Glucose, Bld: 107 mg/dL — ABNORMAL HIGH (ref 70–99)
Potassium: 3.6 mmol/L (ref 3.5–5.1)
Sodium: 139 mmol/L (ref 135–145)
Total Bilirubin: 0.5 mg/dL (ref 0.3–1.2)
Total Protein: 4.5 g/dL — ABNORMAL LOW (ref 6.5–8.1)

## 2020-05-30 LAB — PHOSPHORUS: Phosphorus: 4.4 mg/dL (ref 2.5–4.6)

## 2020-05-30 LAB — C-REACTIVE PROTEIN: CRP: 1.6 mg/dL — ABNORMAL HIGH (ref <1.0)

## 2020-05-30 LAB — GLUCOSE, CAPILLARY
Glucose-Capillary: 64 mg/dL — ABNORMAL LOW (ref 70–99)
Glucose-Capillary: 92 mg/dL (ref 70–99)

## 2020-05-30 LAB — MAGNESIUM: Magnesium: 1.8 mg/dL (ref 1.7–2.4)

## 2020-05-30 LAB — SURGICAL PATHOLOGY

## 2020-05-30 MED ORDER — FUROSEMIDE 10 MG/ML IJ SOLN
40.0000 mg | Freq: Once | INTRAMUSCULAR | Status: AC
  Administered 2020-05-30: 40 mg via INTRAVENOUS
  Filled 2020-05-30: qty 4

## 2020-05-30 NOTE — Progress Notes (Signed)
Made phone call to [facility] Department to confirm positive COVID test date.  Per [facility], patient tested positive on 05/18/2020 at [facility] at [facility].

## 2020-05-30 NOTE — Progress Notes (Signed)
   05/29/20 2340  Oxygen Therapy  SpO2 (!) 83 %  Pt up to BR, refused bedside commode.

## 2020-05-30 NOTE — Progress Notes (Signed)
Postpartum Note Day #4  S:  States she feels about the same- still having SOB with movement.  Denies fever or chills.  No nausea/vomiting.  Tolerating gen diet.  +flatus, + BM.  Lochia appropriate.    O: Temp:  [97.4 F (36.3 C)-98.2 F (36.8 C)] 98.2 F (36.8 C) (09/29 1722) Pulse Rate:  [51-66] 66 (09/29 1722) Resp:  [19-32] 21 (09/29 1722) BP: (98-113)/(59-77) 99/77 (09/29 1722) SpO2:  [83 %-97 %] 89 % (09/29 1722)   Gen: NAD CV: RRR Resp: 10L [place], no increased work of breathing Abdomen: soft, non-distended, non-tender throughout, +BS Uterus: firm, non-tender, below umbilicus Incision:honeycomb dressing intact without any drainage or erythema surrouding Ext: 1+ bilateral LE edema, no bilateral calf tenderness  Labs:   Results for orders placed or performed during the hospital encounter of 05/25/20 (from the past 24 hour(s))  Glucose, capillary     Status: Abnormal   Collection Time: 05/29/20  9:10 PM  Result Value Ref Range   Glucose-Capillary 104 (H) 70 - 99 mg/dL  C-reactive protein     Status: Abnormal   Collection Time: 05/30/20  5:51 AM  Result Value Ref Range   CRP 1.6 (H) <1.0 mg/dL  Magnesium     Status: None   Collection Time: 05/30/20  5:51 AM  Result Value Ref Range   Magnesium 1.8 1.7 - 2.4 mg/dL  Phosphorus     Status: None   Collection Time: 05/30/20  5:51 AM  Result Value Ref Range   Phosphorus 4.4 2.5 - 4.6 mg/dL  CBC with Differential/Platelet     Status: Abnormal   Collection Time: 05/30/20  5:51 AM  Result Value Ref Range   WBC 14.7 (H) 4.0 - 10.5 K/uL   RBC 4.06 3.87 - 5.11 MIL/uL   Hemoglobin 11.4 (L) 12.0 - 15.0 g/dL   HCT 32.2 (L) 36 - 46 %   MCV 87.7 80.0 - 100.0 fL   MCH 28.1 26.0 - 34.0 pg   MCHC 32.0 30.0 - 36.0 g/dL   RDW 02.5 42.7 - 06.2 %   Platelets 235 150 - 400 K/uL   nRBC 0.6 (H) 0.0 - 0.2 %   Neutrophils Relative % 72 %   Neutro Abs 10.6 (H) 1.7 - 7.7 K/uL   Lymphocytes Relative 15 %   Lymphs Abs 2.1 0.7 - 4.0 K/uL    Monocytes Relative 7 %   Monocytes Absolute 1.1 (H) 0 - 1 K/uL   Eosinophils Relative 0 %   Eosinophils Absolute 0.0 0 - 0 K/uL   Basophils Relative 0 %   Basophils Absolute 0.0 0 - 0 K/uL   Immature Granulocytes 6 %   Abs Immature Granulocytes 0.86 (H) 0.00 - 0.07 K/uL   Polychromasia PRESENT   Comprehensive metabolic panel     Status: Abnormal   Collection Time: 05/30/20  5:51 AM  Result Value Ref Range   Sodium 139 135 - 145 mmol/L   Potassium 3.6 3.5 - 5.1 mmol/L   Chloride 106 98 - 111 mmol/L   CO2 23 22 - 32 mmol/L   Glucose, Bld 107 (H) 70 - 99 mg/dL   BUN 18 6 - 20 mg/dL   Creatinine, Ser 3.76 0.44 - 1.00 mg/dL   Calcium 7.9 (L) 8.9 - 10.3 mg/dL   Total Protein 4.5 (L) 6.5 - 8.1 g/dL   Albumin 1.6 (L) 3.5 - 5.0 g/dL   AST 44 (H) 15 - 41 U/L   ALT 69 (H) 0 - 44  U/L   Alkaline Phosphatase 125 38 - 126 U/L   Total Bilirubin 0.5 0.3 - 1.2 mg/dL   GFR calc non Af Amer >60 >60 mL/min   GFR calc Af Amer >60 >60 mL/min   Anion gap 10 5 - 15  D-dimer, quantitative (not at [facility])     Status: Abnormal   Collection Time: 05/30/20  5:51 AM  Result Value Ref Range   D-Dimer, Quant 1.43 (H) 0.00 - 0.50 ug/mL-FEU  Glucose, capillary     Status: Abnormal   Collection Time: 05/30/20  8:38 AM  Result Value Ref Range   Glucose-Capillary 64 (L) 70 - 99 mg/dL  Glucose, capillary     Status: None   Collection Time: 05/30/20  9:40 AM  Result Value Ref Range   Glucose-Capillary 92 70 - 99 mg/dL     A/P: Pt is a 39 y.o. [id number] POD#4 s/p primary LTCS for respiratory distress secondary to COVID PNA.  S/p LTCS - Pain well controlled  - GU: Voiding without problem - GI: Tolerating regular diet - Activity: working on prone position and activity as tolerated - Prophylaxis: Subtherapeutic Lovenox Meeting postop milestones appropriately -baby boy circ desired  COVID PNA - Managed by hospitalist team - Currently on Remdesivir and solumedrol - Transaminitis and AKI thought secondary to  COVID, continues to improve - Accuchecks normal- plan to discontinue  Disposition:  Pending respiratory status and hospitalist recommendations.  Myna Hidalgo, DO [phone] (cell) [phone] (office)

## 2020-05-30 NOTE — Progress Notes (Signed)
PROGRESS NOTE  [name] MLJ:[id number] DOB: 09/28/1980 DOA: 05/25/2020 PCP: Clayborn Heron, MD   LOS: 5 days   Brief Narrative / Interim history: Kristy Hines is an 39 y.o. female G2, P1 35 weeks who comes into the hospital for shortness of breath, her husband  and stepson tested positive for COVID-19 1 week ago she started getting short of breath several days came into the ED was placed on 6 L of oxygen.  Shortly after admission her oxygen requirements increased to 10 L.  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  She is status post C-section on 05/26/2020.  Subjective / 24h Interval events: She is feeling quite fatigued this morning, just went to the bathroom and back and feeling very short of breath.  Denies any chest pain.  No abdominal pain, no nausea or vomiting  Assessment & Plan:  Principal Problem Acute Hypoxic Respiratory Failure due to Covid-19 Viral Illness -Patient admitted to [facility], finished 5 days of Remdesivir. -Started on steroids, continue -Prone stable, continue incentive spirometry, flutter valve, wean off oxygen as tolerated. -She remains quite hypoxic this morning still requiring 10 L high flow, on my initial evaluation when she just got back from the bathroom she was in the 70s.  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  LFTs improving, platelets are normal.  Acute kidney injury-creatinine has now normalized.  She has significant swelling in her legs, received IV fluid, will cautiously give Lasix x1 today  Scheduled Meds: . vitamin C  500 mg Oral Daily  . enoxaparin (LOVENOX)  injection  50 mg Subcutaneous Q24H  . insulin aspart  0-15 Units Subcutaneous TID WC  . insulin aspart  0-5 Units Subcutaneous QHS  . methylPREDNISolone (SOLU-MEDROL) injection  80 mg Intravenous Q12H  . prenatal multivitamin  1 tablet Oral Q1200  . senna-docusate  2 tablet Oral Q24H  . simethicone  80 mg Oral TID PC  . simethicone  80 mg Oral Q24H  . sodium chloride flush  3 mL Intravenous Q12H  . zinc sulfate  220 mg Oral Daily   Continuous Infusions: . sodium chloride     PRN Meds:.sodium chloride, acetaminophen, albuterol, bisacodyl, calcium carbonate, chlorpheniramine-HYDROcodone, coconut oil, witch hazel-glycerin **AND** dibucaine, diphenhydrAMINE, guaiFENesin-dextromethorphan, HYDROmorphone (DILAUDID) injection, menthol-cetylpyridinium, ondansetron **OR** ondansetron (ZOFRAN) IV, oxyCODONE, polyethylene glycol, simethicone, sodium chloride flush, sodium phosphate, zolpidem  DVT prophylaxis: Lovenox Code Status: Full code Family Communication: husband at bedside    Status is: Inpatient  Remains inpatient appropriate because:Inpatient level of care appropriate due to severity of illness   Dispo: The patient is from: Home              Anticipated d/c is to: Home              Anticipated d/c date is: 3 days              Patient currently is not medically stable to d/c.  Procedures:  C section 9/26   Microbiology: none  Antibacterials: none   Objective: Vitals:   05/30/20 0835 05/30/20 0850 05/30/20 0920 05/30/20 0927  BP:  Pulse:      Resp:      Temp:      TempSrc:      SpO2: (!) 88% (!) 87% 90% 91%  Weight:      Height:       No intake or output data in the 24 hours ending 05/30/20 1015 Filed Weights   05/25/20 1754  Weight: 106.6 kg    Examination:  Constitutional: appears fatigued Eyes: no scleral icterus ENMT: Mucous membranes are moist.  Neck: normal, supple Respiratory: bibasilar rhonchi, no wheezing, increased respiratory  effort Cardiovascular: Regular rate and rhythm, no murmurs / rubs / gallops. 1+ edema Abdomen: non distended, no tenderness. Bowel sounds positive.  Musculoskeletal: no clubbing / cyanosis.  Skin: no rashes Neurologic: CN 2-12 grossly intact. Strength 5/5 in all 4.    Data Reviewed: I have independently reviewed following labs and imaging studies   CBC: Recent Labs  Lab 05/26/20 0549 05/27/20 0720 05/28/20 0531 05/29/20 0612 05/30/20 0551  WBC 7.6 15.5* 20.1* 16.3* 14.7*  NEUTROABS 6.9 11.7* 15.6* 15.3* 10.6*  HGB 12.8 11.7* 10.3* 10.6* 11.4*  HCT 39.9 37.9 33.2* 33.5* 35.6*  MCV 89.7 90.2 91.5 90.8 87.7  PLT 156 209 201 213 235   Basic Metabolic Panel: Recent Labs  Lab 05/26/20 0549 05/27/20 0720 05/28/20 0531 05/29/20 0612 05/30/20 0551  NA 140 139 140 140 139  K 4.4 4.7 4.3 3.8 3.6  CL 111 108 110 110 106  CO2 17* 22 23 20* 23  GLUCOSE 132* 127* 115* 94 107*  BUN 10 13 19  23* 18  CREATININE 1.40* 1.46* 1.38* 1.10* 0.93  CALCIUM 8.1* 8.1* 7.9* 7.7* 7.9*  MG 2.3 2.5* 2.4 2.1 1.8  PHOS 5.0* 4.6 4.3 3.9 4.4   GFR: Estimated Creatinine Clearance: 96.5 mL/min (by C-G formula based on SCr of 0.93 mg/dL). Liver Function Tests: Recent Labs  Lab 05/26/20 0549 05/27/20 0720 05/28/20 0531 05/29/20 0612 05/30/20 0551  AST 1,158* 824* 223* 82* 44*  ALT 267* 227* 134* 90* 69*  ALKPHOS 201* 165* 134* 122 125  BILITOT 1.1 0.9 0.5 0.7 0.5  PROT 5.5* 4.9* 4.3* 4.2* 4.5*  ALBUMIN 2.0* 1.7* 1.5* 1.5* 1.6*   No results for input(s): LIPASE, AMYLASE in the last 168 hours. No results for input(s): AMMONIA in the last 168 hours. Coagulation Profile: No results for input(s): INR, PROTIME in the last 168 hours. Cardiac Enzymes: No results for input(s): CKTOTAL, CKMB, CKMBINDEX, TROPONINI in the last 168 hours. BNP (last 3 results) No results for input(s): PROBNP in the last 8760 hours. HbA1C: No results for input(s): HGBA1C in the last 72 hours. CBG: Recent Labs  Lab  05/29/20 1046 05/29/20 1148 05/29/20 2110 05/30/20 0838 05/30/20 0940  GLUCAP 60* 95 104* 64* 92   Lipid Profile: No results for input(s): CHOL, HDL, LDLCALC, TRIG, CHOLHDL, LDLDIRECT in the last 72 hours. Thyroid Function Tests: No results for input(s): TSH, T4TOTAL, FREET4, T3FREE, THYROIDAB in the last 72 hours. Anemia Panel: No results for input(s): VITAMINB12, FOLATE, FERRITIN, TIBC, IRON, RETICCTPCT in the last 72 hours. Urine analysis:    Component Value Date/Time   COLORURINE STRAW (A) 04/20/2018 1539   APPEARANCEUR CLEAR 04/20/2018 1539   LABSPEC 1.005 04/20/2018 1539   PHURINE 7.0 04/20/2018 1539   GLUCOSEU NEGATIVE 04/20/2018 1539   HGBUR LARGE (A) 04/20/2018 1539   BILIRUBINUR NEGATIVE 04/20/2018 1539   KETONESUR NEGATIVE 04/20/2018 1539   PROTEINUR NEGATIVE 04/20/2018 1539   NITRITE NEGATIVE 04/20/2018 1539  LEUKOCYTESUR NEGATIVE 04/20/2018 1539   Sepsis Labs: Invalid input(s): PROCALCITONIN, LACTICIDVEN  No results found for this or any previous visit (from the past 240 hour(s)).    Radiology Studies: No results found.   Pamella Pert, MD, PhD [facility]  Between 7 am - 7 pm I am available, please contact me via Amion or Securechat  Between 7 pm - 7 am I am not available, please contact night coverage MD/APP via Amion

## 2020-05-30 NOTE — Progress Notes (Signed)
Hypoglycemic Event  CBG: 64  Treatment: apple juice given. Patient starting breakfast (vegetable and sausage omlet, bacon)  Symptoms: None  Follow-up CBG: repeated after patient finished juice and breakfast at 0940   CBG Result: 92    Marsi Turvey M Aizley Stenseth

## 2020-05-31 LAB — CBC WITH DIFFERENTIAL/PLATELET
Abs Immature Granulocytes: 0.58 10*3/uL — ABNORMAL HIGH (ref 0.00–0.07)
Basophils Absolute: 0.1 10*3/uL (ref 0.0–0.1)
Basophils Relative: 0 %
Eosinophils Absolute: 0 10*3/uL (ref 0.0–0.5)
Eosinophils Relative: 0 %
HCT: 38 % (ref 36.0–46.0)
Hemoglobin: 12.4 g/dL (ref 12.0–15.0)
Immature Granulocytes: 4 %
Lymphocytes Relative: 11 %
Lymphs Abs: 1.4 10*3/uL (ref 0.7–4.0)
MCH: 28.4 pg (ref 26.0–34.0)
MCHC: 32.6 g/dL (ref 30.0–36.0)
MCV: 87.2 fL (ref 80.0–100.0)
Monocytes Absolute: 1 10*3/uL (ref 0.1–1.0)
Monocytes Relative: 7 %
Neutro Abs: 10.1 10*3/uL — ABNORMAL HIGH (ref 1.7–7.7)
Neutrophils Relative %: 78 %
Platelets: 233 10*3/uL (ref 150–400)
RBC: 4.36 MIL/uL (ref 3.87–5.11)
RDW: 13.6 % (ref 11.5–15.5)
WBC: 13.1 10*3/uL — ABNORMAL HIGH (ref 4.0–10.5)
nRBC: 0.2 % (ref 0.0–0.2)

## 2020-05-31 LAB — COMPREHENSIVE METABOLIC PANEL
ALT: 48 U/L — ABNORMAL HIGH (ref 0–44)
AST: 27 U/L (ref 15–41)
Albumin: 1.6 g/dL — ABNORMAL LOW (ref 3.5–5.0)
Alkaline Phosphatase: 124 U/L (ref 38–126)
Anion gap: 9 (ref 5–15)
BUN: 20 mg/dL (ref 6–20)
CO2: 27 mmol/L (ref 22–32)
Calcium: 7.8 mg/dL — ABNORMAL LOW (ref 8.9–10.3)
Chloride: 101 mmol/L (ref 98–111)
Creatinine, Ser: 0.88 mg/dL (ref 0.44–1.00)
GFR calc Af Amer: >60 mL/min (ref >60)
GFR calc non Af Amer: >60 mL/min (ref >60)
Glucose, Bld: 141 mg/dL — ABNORMAL HIGH (ref 70–99)
Potassium: 3.6 mmol/L (ref 3.5–5.1)
Sodium: 137 mmol/L (ref 135–145)
Total Bilirubin: 0.5 mg/dL (ref 0.3–1.2)
Total Protein: 4.9 g/dL — ABNORMAL LOW (ref 6.5–8.1)

## 2020-05-31 LAB — D-DIMER, QUANTITATIVE: D-Dimer, Quant: 1.11 ug/mL-FEU — ABNORMAL HIGH (ref 0.00–0.50)

## 2020-05-31 LAB — C-REACTIVE PROTEIN: CRP: 2 mg/dL — ABNORMAL HIGH (ref <1.0)

## 2020-05-31 MED ORDER — FUROSEMIDE 10 MG/ML IJ SOLN
40.0000 mg | Freq: Once | INTRAMUSCULAR | Status: AC
  Administered 2020-05-31: 40 mg via INTRAVENOUS
  Filled 2020-05-31: qty 4

## 2020-05-31 NOTE — Progress Notes (Signed)
PROGRESS NOTE  Kristy Hines TMH:[id number] DOB: [birth year] DOA: 05/25/2020 PCP: Clayborn Heron, MD   LOS: 6 days   Brief Narrative / Interim history: Kristy Hines is an 39 y.o. female G2, P1 35 weeks who comes into the hospital for shortness of breath, her husband  and stepson tested positive for COVID-19 1 week ago she started getting short of breath several days came into the ED was placed on 6 L of oxygen.  Shortly after admission her oxygen requirements increased to 10 L.  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  She is status post C-section on 05/26/2020.  Subjective / 24h Interval events: Feels slightly better overall however very fatigued and tired. Shortness of breath is manly with activities  Assessment & Plan:  Principal Problem Acute Hypoxic Respiratory Failure due to Covid-19 Viral Illness -Patient admitted to [facility], finished 5 days of Remdesivir. -Started on steroids, continue -Prone stable, continue incentive spirometry, flutter valve, wean off oxygen as tolerated. -she remains hypoxic, 10L this morning. Patient asks me whether there is any additional medications that can be uses, we discussed risks / benefits of actemra. It is unclear how much benefit from this at this point into her disease process, however given risks patient and husband decided to hold off.  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  LFTs improving, platelets are normal.  Acute kidney injury-creatinine has now normalized.  She has significant swelling in her legs, received IV fluids initially, s/p Lasix x 1 9/29, repeat  today 9/30  Scheduled Meds: . vitamin C  500 mg Oral Daily  . enoxaparin (LOVENOX) injection  50 mg Subcutaneous Q24H  . methylPREDNISolone (SOLU-MEDROL) injection  80 mg Intravenous Q12H  . prenatal multivitamin  1 tablet Oral Q1200  . senna-docusate  2 tablet Oral Q24H  . simethicone  80 mg Oral TID PC  . simethicone  80 mg Oral Q24H  . sodium chloride flush  3 mL Intravenous Q12H  . zinc sulfate  220 mg Oral Daily   Continuous Infusions: . sodium chloride     PRN Meds:.sodium chloride, acetaminophen, albuterol, bisacodyl, calcium carbonate, chlorpheniramine-HYDROcodone, coconut oil, witch hazel-glycerin **AND** dibucaine, diphenhydrAMINE, guaiFENesin-dextromethorphan, HYDROmorphone (DILAUDID) injection, menthol-cetylpyridinium, ondansetron **OR** ondansetron (ZOFRAN) IV, oxyCODONE, polyethylene glycol, simethicone, sodium chloride flush, sodium phosphate, zolpidem  DVT prophylaxis: Lovenox Code Status: Full code Family Communication: husband at bedside    Status is: Inpatient  Remains inpatient appropriate because:Inpatient level of care appropriate due to severity of illness   Dispo: The patient is from: Home              Anticipated d/c is to: Home              Anticipated d/c date is: 3 days              Patient currently is not medically stable to d/c.  Procedures:  C section 9/26   Microbiology: none  Antibacterials: none   Objective: Vitals:   05/31/20 1030 05/31/20 1100 05/31/20 1130 05/31/20 1131  BP:    120/70  Pulse:    (!) 54  Resp:  20  Temp:    97.8 F (36.6 C)  TempSrc:    Oral  SpO2: 94% 93% 94%   Weight:      Height:       No intake or output data in the 24 hours ending 05/31/20 1306 Filed Weights   05/25/20 1754  Weight: 106.6 kg    Examination:  Constitutional: in bed Eyes: no icterus ENMT: mmm Neck: normal, supple Respiratory: bibasilar rhonchi, no wheezing, no crackles Cardiovascular: rrr, no mrg, 1+ edema Abdomen: soft,  nt, nd, BS+ Musculoskeletal: no clubbing / cyanosis.  Skin: no rashes Neurologic: non focal    Data Reviewed: I have independently reviewed following labs and imaging studies   CBC: Recent Labs  Lab 05/27/20 0720 05/28/20 0531 05/29/20 0612 05/30/20 0551 05/31/20 0558  WBC 15.5* 20.1* 16.3* 14.7* 13.1*  NEUTROABS 11.7* 15.6* 15.3* 10.6* 10.1*  HGB 11.7* 10.3* 10.6* 11.4* 12.4  HCT 37.9 33.2* 33.5* 35.6* 38.0  MCV 90.2 91.5 90.8 87.7 87.2  PLT 209 201 213 235 233   Basic Metabolic Panel: Recent Labs  Lab 05/26/20 0549 05/26/20 0549 05/27/20 0720 05/28/20 0531 05/29/20 0612 05/30/20 0551 05/31/20 0558  NA 140   < > 139 140 140 139 137  K 4.4   < > 4.7 4.3 3.8 3.6 3.6  CL 111   < > 108 110 110 106 101  CO2 17*   < > 22 23 20* 23 27  GLUCOSE 132*   < > 127* 115* 94 107* 141*  BUN 10   < > 13 19 23* 18 20  CREATININE 1.40*   < > 1.46* 1.38* 1.10* 0.93 0.88  CALCIUM 8.1*   < > 8.1* 7.9* 7.7* 7.9* 7.8*  MG 2.3  --  2.5* 2.4 2.1 1.8  --   PHOS 5.0*  --  4.6 4.3 3.9 4.4  --    < > = values in this interval not displayed.   GFR: Estimated Creatinine Clearance: 102 mL/min (by C-G formula based on SCr of 0.88 mg/dL). Liver Function Tests: Recent Labs  Lab 05/27/20 0720 05/28/20 0531 05/29/20 0612 05/30/20 0551 05/31/20 0558  AST 824* 223* 82* 44* 27  ALT 227* 134* 90* 69* 48*  ALKPHOS 165* 134* 122 125 124  BILITOT 0.9 0.5 0.7 0.5 0.5  PROT 4.9* 4.3* 4.2* 4.5* 4.9*  ALBUMIN 1.7* 1.5* 1.5* 1.6* 1.6*   No results for input(s): LIPASE, AMYLASE in the last 168 hours. No results for input(s): AMMONIA in the last 168 hours. Coagulation Profile: No results for input(s): INR, PROTIME in the last 168 hours. Cardiac Enzymes: No results for input(s): CKTOTAL, CKMB, CKMBINDEX, TROPONINI in the last 168 hours. BNP (last 3 results) No results for input(s): PROBNP in the last 8760 hours. HbA1C: No results for input(s): HGBA1C in the last 72 hours. CBG: Recent Labs  Lab  05/29/20 1046 05/29/20 1148 05/29/20 2110 05/30/20 0838 05/30/20 0940  GLUCAP 60* 95 104* 64* 92   Lipid Profile: No results for input(s): CHOL, HDL, LDLCALC, TRIG, CHOLHDL, LDLDIRECT in the last 72 hours. Thyroid Function Tests: No results for input(s): TSH, T4TOTAL, FREET4, T3FREE, THYROIDAB in the last 72 hours. Anemia Panel: No results for input(s): VITAMINB12, FOLATE, FERRITIN, TIBC, IRON, RETICCTPCT in the last 72 hours. Urine analysis:    Component Value Date/Time   COLORURINE STRAW (A) 04/20/2018 1539   APPEARANCEUR CLEAR 04/20/2018 1539   LABSPEC 1.005 04/20/2018 1539   PHURINE 7.0 04/20/2018 1539   GLUCOSEU NEGATIVE 04/20/2018  1539   HGBUR LARGE (A) 04/20/2018 1539   BILIRUBINUR NEGATIVE 04/20/2018 1539   KETONESUR NEGATIVE 04/20/2018 1539   PROTEINUR NEGATIVE 04/20/2018 1539   NITRITE NEGATIVE 04/20/2018 1539   LEUKOCYTESUR NEGATIVE 04/20/2018 1539   Sepsis Labs: Invalid input(s): PROCALCITONIN, LACTICIDVEN  No results found for this or any previous visit (from the past 240 hour(s)).    Radiology Studies: No results found.   Pamella Pert, MD, PhD [facility]  Between 7 am - 7 pm I am available, please contact me via Amion or Securechat  Between 7 pm - 7 am I am not available, please contact night coverage MD/APP via Amion

## 2020-05-31 NOTE — Progress Notes (Signed)
Postpartum Note Day #5  S:  States she feels maybe a bit better than yesterday- still SOB and trouble with movement/walking to the restroom.  Denies fever or chills.  No nausea/vomiting.  Tolerating gen diet.  +flatus, + BM.  Lochia appropriate.    O: Temp:  [97.8 F (36.6 C)-98.2 F (36.8 C)] 97.8 F (36.6 C) (09/30 1131) Pulse Rate:  [46-78] 54 (09/30 1131) Resp:  [16-21] 20 (09/30 1131) BP: (93-129)/(54-77) 120/70 (09/30 1131) SpO2:  [75 %-97 %] 94 % (09/30 1130)   Gen: NAD CV: RRR Resp: 10L [place], no increased work of breathing at rest Abdomen: soft, non-distended, non-tender throughout, +BS Uterus: firm, non-tender, below umbilicus Incision:honeycomb dressing intact without any drainage or erythema surrouding Ext: Edema improved, no bilateral calf tenderness  Labs:   Results for orders placed or performed during the hospital encounter of 05/25/20 (from the past 24 hour(s))  CBC with Differential/Platelet     Status: Abnormal   Collection Time: 05/31/20  5:58 AM  Result Value Ref Range   WBC 13.1 (H) 4.0 - 10.5 K/uL   RBC 4.36 3.87 - 5.11 MIL/uL   Hemoglobin 12.4 12.0 - 15.0 g/dL   HCT 33.5 36 - 46 %   MCV 87.2 80.0 - 100.0 fL   MCH 28.4 26.0 - 34.0 pg   MCHC 32.6 30.0 - 36.0 g/dL   RDW 45.6 25.6 - 38.9 %   Platelets 233 150 - 400 K/uL   nRBC 0.2 0.0 - 0.2 %   Neutrophils Relative % 78 %   Neutro Abs 10.1 (H) 1.7 - 7.7 K/uL   Lymphocytes Relative 11 %   Lymphs Abs 1.4 0.7 - 4.0 K/uL   Monocytes Relative 7 %   Monocytes Absolute 1.0 0 - 1 K/uL   Eosinophils Relative 0 %   Eosinophils Absolute 0.0 0 - 0 K/uL   Basophils Relative 0 %   Basophils Absolute 0.1 0 - 0 K/uL   Immature Granulocytes 4 %   Abs Immature Granulocytes 0.58 (H) 0.00 - 0.07 K/uL  Comprehensive metabolic panel     Status: Abnormal   Collection Time: 05/31/20  5:58 AM  Result Value Ref Range   Sodium 137 135 - 145 mmol/L   Potassium 3.6 3.5 - 5.1 mmol/L   Chloride 101 98 - 111 mmol/L   CO2 27 22  - 32 mmol/L   Glucose, Bld 141 (H) 70 - 99 mg/dL   BUN 20 6 - 20 mg/dL   Creatinine, Ser 3.73 0.44 - 1.00 mg/dL   Calcium 7.8 (L) 8.9 - 10.3 mg/dL   Total Protein 4.9 (L) 6.5 - 8.1 g/dL   Albumin 1.6 (L) 3.5 - 5.0 g/dL   AST 27 15 - 41 U/L   ALT 48 (H) 0 - 44 U/L   Alkaline Phosphatase 124 38 - 126 U/L   Total Bilirubin 0.5 0.3 - 1.2 mg/dL   GFR calc non Af Amer >60 >60 mL/min   GFR calc Af Amer >60 >60 mL/min   Anion gap 9 5 - 15  C-reactive protein     Status: Abnormal   Collection Time: 05/31/20  5:58 AM  Result Value Ref Range   CRP 2.0 (H) <1.0 mg/dL  D-dimer, quantitative (not at [facility])     Status: Abnormal   Collection Time: 05/31/20  5:58 AM  Result Value Ref Range   D-Dimer, Quant 1.11 (H) 0.00 - 0.50 ug/mL-FEU     A/P: Pt is a 39 y.o. [id number]  POD#5 s/p primary LTCS for respiratory distress secondary to COVID PNA.  S/p LTCS - Pain well controlled  - GU: Voiding without problem - GI: Tolerating regular diet - Activity: working on prone position and activity as tolerated - Prophylaxis: on Lovenox Meeting postop milestones appropriately -baby boy circ desired  COVID PNA - Managed by hospitalist team - Currently on Remdesivir and solumedrol - Transaminitis and AKI thought secondary to COVID- now resolved   Disposition:  Pending respiratory status and hospitalist recommendations.  Myna Hidalgo, DO [phone] (cell) [phone] (office)

## 2020-06-01 LAB — CBC
HCT: 39.7 % (ref 36.0–46.0)
Hemoglobin: 12.6 g/dL (ref 12.0–15.0)
MCH: 27.8 pg (ref 26.0–34.0)
MCHC: 31.7 g/dL (ref 30.0–36.0)
MCV: 87.4 fL (ref 80.0–100.0)
Platelets: 245 10*3/uL (ref 150–400)
RBC: 4.54 MIL/uL (ref 3.87–5.11)
RDW: 13.7 % (ref 11.5–15.5)
WBC: 13 10*3/uL — ABNORMAL HIGH (ref 4.0–10.5)
nRBC: 0 % (ref 0.0–0.2)

## 2020-06-01 LAB — BASIC METABOLIC PANEL
Anion gap: 10 (ref 5–15)
BUN: 24 mg/dL — ABNORMAL HIGH (ref 6–20)
CO2: 27 mmol/L (ref 22–32)
Calcium: 8.2 mg/dL — ABNORMAL LOW (ref 8.9–10.3)
Chloride: 100 mmol/L (ref 98–111)
Creatinine, Ser: 0.85 mg/dL (ref 0.44–1.00)
GFR calc Af Amer: >60 mL/min (ref >60)
GFR calc non Af Amer: >60 mL/min (ref >60)
Glucose, Bld: 171 mg/dL — ABNORMAL HIGH (ref 70–99)
Potassium: 3.6 mmol/L (ref 3.5–5.1)
Sodium: 137 mmol/L (ref 135–145)

## 2020-06-01 LAB — D-DIMER, QUANTITATIVE: D-Dimer, Quant: 0.97 ug/mL-FEU — ABNORMAL HIGH (ref 0.00–0.50)

## 2020-06-01 LAB — C-REACTIVE PROTEIN: CRP: 1.1 mg/dL — ABNORMAL HIGH (ref <1.0)

## 2020-06-01 NOTE — Progress Notes (Addendum)
O2 titrated down to [facility] as patient was maintaining 90% on RA  Pt refusing to wear O2 through out 7a-7p shift

## 2020-06-01 NOTE — Progress Notes (Signed)
Inpatient Diabetes Program Recommendations  AACE/ADA: New Consensus Statement on Inpatient Glycemic Control (2015)  Target Ranges:  Prepandial:   less than 140 mg/dL      Peak postprandial:   less than 180 mg/dL (1-2 hours)      Critically ill patients:  140 - 180 mg/dL   Lab Results  Component Value Date   GLUCAP 92 05/30/2020    Review of Glycemic Control Results for Kristy Hines, Kristy Hines (MRN [id number]) as of 06/01/2020 09:31  Ref. Range 05/31/2020 05:58 06/01/2020 04:55  Glucose Latest Ref Range: 70 - 99 mg/dL 300 (H) 923 (H)    AM glucose serum elevated x 2. Noted that CBGs were discontinued. May want to consider adding back, especially with steroids. Consider CBGs TID.   Thanks, Lujean Rave, MSN, RNC-OB Diabetes Coordinator [phone] (8a-5p)

## 2020-06-01 NOTE — Progress Notes (Signed)
PROGRESS NOTE  Kristy Hines EHM:[id number] DOB: Sep 02, 1980 DOA: 05/25/2020 PCP: Clayborn Heron, MD   LOS: 7 days   Brief Narrative / Interim history: Kristy Hines is an 39 y.o. female G2, P1 35 weeks who comes into the hospital for shortness of breath, her husband  and stepson tested positive for COVID-19 1 week ago she started getting short of breath several days came into the ED was placed on 6 L of oxygen.  Shortly after admission her oxygen requirements increased to 10 L.  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  She is status post C-section on 05/26/2020.  Subjective / 24h Interval events: Continues to complain of being very fatigued.  Remains very short of breath and wiped out of energy with just walking to the bathroom  Assessment & Plan:  Principal Problem Acute Hypoxic Respiratory Failure due to Covid-19 Viral Illness -Patient admitted to [facility], finished 5 days of Remdesivir. -Started on steroids, continue -Prone as able, continue incentive spirometry, flutter valve, wean off oxygen as tolerated. -Patient asked me on 9/30 whether there is any additional medications that can be uses, we discussed risks / benefits of actemra. 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  LFTs improving, platelets have now normalized  Acute kidney injury-creatinine has now normalized.  She has significant swelling in her legs, received IV  fluids initially, s/p Lasix 9/29, 9/30   Scheduled Meds: . vitamin C  500 mg Oral Daily  . enoxaparin (LOVENOX) injection  50 mg Subcutaneous Q24H  . methylPREDNISolone (SOLU-MEDROL) injection  80 mg Intravenous Q12H  . prenatal multivitamin  1 tablet Oral Q1200  . senna-docusate  2 tablet Oral Q24H  . simethicone  80 mg Oral TID PC  . simethicone  80 mg Oral Q24H  . sodium chloride flush  3 mL Intravenous Q12H  . zinc sulfate  220 mg Oral Daily   Continuous Infusions: . sodium chloride     PRN Meds:.sodium chloride, acetaminophen, albuterol, bisacodyl, calcium carbonate, chlorpheniramine-HYDROcodone, coconut oil, witch hazel-glycerin **AND** dibucaine, diphenhydrAMINE, guaiFENesin-dextromethorphan, HYDROmorphone (DILAUDID) injection, menthol-cetylpyridinium, ondansetron **OR** ondansetron (ZOFRAN) IV, oxyCODONE, polyethylene glycol, simethicone, sodium chloride flush, sodium phosphate, zolpidem  DVT prophylaxis: Lovenox Code Status: Full code Family Communication: husband at bedside    Status is: Inpatient  Remains inpatient appropriate because:Inpatient level of care appropriate due to severity of illness   Dispo: The patient is from: Home              Anticipated d/c is to: Home              Anticipated d/c date is: 3 days              Patient currently is not medically stable to d/c.  Procedures:  C section 9/26   Microbiology: none  Antibacterials: none   Objective: Vitals:   06/01/20 1130 06/01/20 1135 06/01/20 1140 06/01/20 1213  BP:    105/85  Pulse:  88  Resp:    16  Temp:    97.9 F (36.6 C)  TempSrc:    Axillary  SpO2: 90% 92% 92% 92%  Weight:      Height:       No intake or output data in the 24 hours ending 06/01/20 1311 Filed Weights   05/25/20 1754  Weight: 106.6 kg    Examination:  Constitutional: In bed, appears fatigued Eyes: No scleral icterus ENMT: Moist mucous membranes Neck: normal, supple Respiratory: Bibasilar rhonchi, no  wheezing or crackles Cardiovascular: Regular rate and rhythm, no murmurs, trace edema Abdomen: Soft, nontender, nondistended, positive bowel sounds Musculoskeletal: no clubbing / cyanosis.  Skin: No rashes appreciated Neurologic: No focal deficits   Data Reviewed: I have independently reviewed following labs and imaging studies   CBC: Recent Labs  Lab 05/27/20 0720 05/27/20 0720 05/28/20 0531 05/29/20 0612 05/30/20 0551 05/31/20 0558 06/01/20 0455  WBC 15.5*   < > 20.1* 16.3* 14.7* 13.1* 13.0*  NEUTROABS 11.7*  --  15.6* 15.3* 10.6* 10.1*  --   HGB 11.7*   < > 10.3* 10.6* 11.4* 12.4 12.6  HCT 37.9   < > 33.2* 33.5* 35.6* 38.0 39.7  MCV 90.2   < > 91.5 90.8 87.7 87.2 87.4  PLT 209   < > 201 213 235 233 245   < > = values in this interval not displayed.   Basic Metabolic Panel: Recent Labs  Lab 05/26/20 0549 05/26/20 0549 05/27/20 0720 05/27/20 0720 05/28/20 0531 05/29/20 0612 05/30/20 0551 05/31/20 0558 06/01/20 0455  NA 140   < > 139   < > 140 140 139 137 137  K 4.4   < > 4.7   < > 4.3 3.8 3.6 3.6 3.6  CL 111   < > 108   < > 110 110 106 101 100  CO2 17*   < > 22   < > 23 20* 23 27 27   GLUCOSE 132*   < > 127*   < > 115* 94 107* 141* 171*  BUN 10   < > 13   < > 19 23* 18 20 24*  CREATININE 1.40*   < > 1.46*   < > 1.38* 1.10* 0.93 0.88 0.85  CALCIUM 8.1*   < > 8.1*   < > 7.9* 7.7* 7.9* 7.8* 8.2*  MG 2.3  --  2.5*  --  2.4 2.1 1.8  --   --   PHOS 5.0*  --  4.6  --  4.3 3.9 4.4  --   --    < > = values in this interval not displayed.   GFR: Estimated Creatinine Clearance: 105.6 mL/min (by C-G formula based on SCr of 0.85 mg/dL). Liver Function Tests: Recent Labs  Lab 05/27/20 0720 05/28/20 0531 05/29/20 0612 05/30/20 0551 05/31/20 0558  AST 824* 223* 82* 44* 27  ALT 227* 134* 90* 69* 48*  ALKPHOS 165* 134* 122 125 124  BILITOT 0.9 0.5 0.7 0.5 0.5  PROT 4.9* 4.3* 4.2* 4.5* 4.9*  ALBUMIN 1.7* 1.5* 1.5* 1.6* 1.6*   No results for input(s): LIPASE, AMYLASE  in the last 168 hours. No results for input(s): AMMONIA in the last 168 hours. Coagulation Profile: No results for input(s): INR, PROTIME in the last 168 hours. Cardiac Enzymes: No results for input(s): CKTOTAL, CKMB, CKMBINDEX, TROPONINI in the last 168 hours. BNP (last 3 results) No results for input(s): PROBNP in the last 8760 hours. HbA1C: No results for input(s):  HGBA1C in the last 72 hours. CBG: Recent Labs  Lab 05/29/20 1046 05/29/20 1148 05/29/20 2110 05/30/20 0838 05/30/20 0940  GLUCAP 60* 95 104* 64* 92   Lipid Profile: No results for input(s): CHOL, HDL, LDLCALC, TRIG, CHOLHDL, LDLDIRECT in the last 72 hours. Thyroid Function Tests: No results for input(s): TSH, T4TOTAL, FREET4, T3FREE, THYROIDAB in the last 72 hours. Anemia Panel: No results for input(s): VITAMINB12, FOLATE, FERRITIN, TIBC, IRON, RETICCTPCT in the last 72 hours. Urine analysis:    Component Value Date/Time   COLORURINE STRAW (A) 04/20/2018 1539   APPEARANCEUR CLEAR 04/20/2018 1539   LABSPEC 1.005 04/20/2018 1539   PHURINE 7.0 04/20/2018 1539   GLUCOSEU NEGATIVE 04/20/2018 1539   HGBUR LARGE (A) 04/20/2018 1539   BILIRUBINUR NEGATIVE 04/20/2018 1539   KETONESUR NEGATIVE 04/20/2018 1539   PROTEINUR NEGATIVE 04/20/2018 1539   NITRITE NEGATIVE 04/20/2018 1539   LEUKOCYTESUR NEGATIVE 04/20/2018 1539   Sepsis Labs: Invalid input(s): PROCALCITONIN, LACTICIDVEN  No results found for this or any previous visit (from the past 240 hour(s)).    Radiology Studies: No results found.   Pamella Pert, MD, PhD [facility]  Between 7 am - 7 pm I am available, please contact me via Amion or Securechat  Between 7 pm - 7 am I am not available, please contact night coverage MD/APP via Amion

## 2020-06-01 NOTE — Progress Notes (Signed)
Postpartum Note Day #6-late entry for this am  S:  Today she reports that she feels about the same as yesterday,- still SOB and trouble with movement/walking to the restroom.  Denies fever or chills.  No nausea/vomiting.  Tolerating gen diet.  +flatus, + BM.  Lochia appropriate.    O: Temp:  [97.5 F (36.4 C)-98.1 F (36.7 C)] 97.9 F (36.6 C) (10/01 1213) Pulse Rate:  [46-88] 88 (10/01 1213) Resp:  [16-34] 16 (10/01 1213) BP: (90-129)/(60-85) 105/85 (10/01 1213) SpO2:  [81 %-97 %] 92 % (10/01 1213)   Gen: NAD CV: RRR Resp: 10L , no increased work of breathing at rest, CTAB Abdomen: soft, non-distended, non-tender throughout, +BS Uterus: firm, non-tender, below umbilicus Incision:honeycomb dressing intact without any drainage or erythema surrounding Ext: Edema improved, no bilateral calf tenderness  A/P: Pt is a 39 y.o. [id number] POD#6 s/p primary LTCS for respiratory distress secondary to COVID PNA.  S/p LTCS - Pain well controlled  - GU: Voiding without problem - GI: Tolerating regular diet - Activity: working on prone position and activity as tolerated - Prophylaxis: on Lovenox Meeting postop milestones appropriately -baby boy circ completed  COVID PNA - Managed per hospitalist team - a/p Remdesivir, currently on solumedrol - continued supportive care   Disposition:  Pending respiratory status and hospitalist recommendations.  Myna Hidalgo, DO [phone] (cell) [phone] (office)

## 2020-06-02 ENCOUNTER — Inpatient Hospital Stay (HOSPITAL_COMMUNITY): Payer: BLUE CROSS/BLUE SHIELD

## 2020-06-02 LAB — C-REACTIVE PROTEIN: CRP: 0.7 mg/dL (ref <1.0)

## 2020-06-02 LAB — CBC
HCT: 40.2 % (ref 36.0–46.0)
Hemoglobin: 12.6 g/dL (ref 12.0–15.0)
MCH: 27.6 pg (ref 26.0–34.0)
MCHC: 31.3 g/dL (ref 30.0–36.0)
MCV: 88.2 fL (ref 80.0–100.0)
Platelets: 239 10*3/uL (ref 150–400)
RBC: 4.56 MIL/uL (ref 3.87–5.11)
RDW: 13.7 % (ref 11.5–15.5)
WBC: 11.9 10*3/uL — ABNORMAL HIGH (ref 4.0–10.5)
nRBC: 0 % (ref 0.0–0.2)

## 2020-06-02 LAB — COMPREHENSIVE METABOLIC PANEL
ALT: 29 U/L (ref 0–44)
AST: 22 U/L (ref 15–41)
Albumin: 1.8 g/dL — ABNORMAL LOW (ref 3.5–5.0)
Alkaline Phosphatase: 113 U/L (ref 38–126)
Anion gap: 9 (ref 5–15)
BUN: 16 mg/dL (ref 6–20)
CO2: 26 mmol/L (ref 22–32)
Calcium: 8.2 mg/dL — ABNORMAL LOW (ref 8.9–10.3)
Chloride: 105 mmol/L (ref 98–111)
Creatinine, Ser: 0.63 mg/dL (ref 0.44–1.00)
GFR calc Af Amer: >60 mL/min (ref >60)
GFR calc non Af Amer: >60 mL/min (ref >60)
Glucose, Bld: 122 mg/dL — ABNORMAL HIGH (ref 70–99)
Potassium: 4.1 mmol/L (ref 3.5–5.1)
Sodium: 140 mmol/L (ref 135–145)
Total Bilirubin: 0.6 mg/dL (ref 0.3–1.2)
Total Protein: 5 g/dL — ABNORMAL LOW (ref 6.5–8.1)

## 2020-06-02 LAB — D-DIMER, QUANTITATIVE: D-Dimer, Quant: 0.93 ug/mL-FEU — ABNORMAL HIGH (ref 0.00–0.50)

## 2020-06-02 LAB — BRAIN NATRIURETIC PEPTIDE: B Natriuretic Peptide: 73.7 pg/mL (ref 0.0–100.0)

## 2020-06-02 MED ORDER — FUROSEMIDE 10 MG/ML IJ SOLN
40.0000 mg | Freq: Once | INTRAMUSCULAR | Status: AC
  Administered 2020-06-02: 40 mg via INTRAVENOUS
  Filled 2020-06-02: qty 4

## 2020-06-02 NOTE — Progress Notes (Signed)
PROGRESS NOTE  Kristy Hines KXF:[id number] DOB: 09-05-1980 DOA: 05/25/2020 PCP: Clayborn Heron, MD   LOS: 8 days   Brief Narrative / Interim history: Kristy Hines is an 39 y.o. female G2, P1 35 weeks who comes into the hospital for shortness of breath, her husband  and stepson tested positive for COVID-19 1 week ago she started getting short of breath several days came into the ED was placed on 6 L of oxygen.  Shortly after admission her oxygen requirements increased to 10 L.  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  She is status post C-section on 05/26/2020.  Subjective / 24h Interval events: Feels a little bit better.  No energy, unable to shower  Assessment & Plan:  Principal Problem Acute Hypoxic Respiratory Failure due to Covid-19 Viral Illness -Patient admitted to [facility], finished 5 days of Remdesivir. -Started on steroids, continue -Prone as able, continue incentive spirometry, flutter valve, wean off oxygen as tolerated.  -Patient asked me on 9/30 whether there is any additional medications that can be used, we discussed risks / benefits of actemra. 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  LFTs improving, platelets have now normalized  Acute kidney injury-creatinine has now normalized.  She has significant swelling in her legs, received IV fluids initially, s/p Lasix 9/29, 9/30 and repeat 10/2.  Renal function is  stable now.  Scheduled Meds: . vitamin C  500 mg Oral Daily  . enoxaparin (LOVENOX) injection  50 mg Subcutaneous Q24H  . methylPREDNISolone (SOLU-MEDROL) injection  80 mg Intravenous Q12H  . prenatal multivitamin  1 tablet Oral Q1200  . senna-docusate  2 tablet Oral Q24H  . simethicone  80 mg Oral TID PC  . simethicone  80 mg Oral Q24H  . sodium chloride flush  3 mL Intravenous Q12H  . zinc sulfate  220 mg Oral Daily   Continuous Infusions: . sodium chloride     PRN Meds:.sodium chloride, acetaminophen, albuterol, bisacodyl, calcium carbonate, chlorpheniramine-HYDROcodone, coconut oil, witch hazel-glycerin **AND** dibucaine, diphenhydrAMINE, guaiFENesin-dextromethorphan, HYDROmorphone (DILAUDID) injection, menthol-cetylpyridinium, ondansetron **OR** ondansetron (ZOFRAN) IV, oxyCODONE, polyethylene glycol, simethicone, sodium chloride flush, sodium phosphate, zolpidem  DVT prophylaxis: Lovenox Code Status: Full code Family Communication: husband at bedside    Status is: Inpatient  Remains inpatient appropriate because:Inpatient level of care appropriate due to severity of illness   Dispo: The patient is from: Home              Anticipated d/c is to: Home              Anticipated d/c date is: 3 days              Patient currently is not medically stable to d/c.  Procedures:  C section 9/26   Microbiology: none  Antibacterials: none   Objective: Vitals:   06/02/20 0500 06/02/20 0920 06/02/20 0925 06/02/20 1000  BP:   (!) 107/58   Pulse:   65  Resp:   18   Temp:   97.6 F (36.4 C)   TempSrc:   Oral   SpO2: 91% 90% (!) 87% 90%  Weight:      Height:       No intake or output data in the 24 hours ending 06/02/20 1111 Filed Weights   05/25/20 1754  Weight: 106.6 kg    Examination:  Constitutional: No distress, in bed Eyes: No scleral icterus ENMT: mmm Neck: normal, supple Respiratory: Diminished at the bases but overall clear, no wheezing or crackles  heard, moves air well Cardiovascular: Regular rate and rhythm, no murmurs appreciated.  Trace edema Abdomen: Soft, nontender, nondistended, positive bowel sounds Musculoskeletal: no clubbing / cyanosis.  Skin: No rashes appreciated Neurologic: Nonfocal, equal strength  Data Reviewed: I have independently reviewed following labs and imaging studies   CBC: Recent Labs  Lab 05/27/20 0720 05/27/20 0720 05/28/20 0531 05/28/20 0531 05/29/20 0612 05/30/20 0551 05/31/20 0558 06/01/20 0455 06/02/20 0806  WBC 15.5*   < > 20.1*   < > 16.3* 14.7* 13.1* 13.0* 11.9*  NEUTROABS 11.7*  --  15.6*  --  15.3* 10.6* 10.1*  --   --   HGB 11.7*   < > 10.3*   < > 10.6* 11.4* 12.4 12.6 12.6  HCT 37.9   < > 33.2*   < > 33.5* 35.6* 38.0 39.7 40.2  MCV 90.2   < > 91.5   < > 90.8 87.7 87.2 87.4 88.2  PLT 209   < > 201   < > 213 235 233 245 239   < > = values in this interval not displayed.   Basic Metabolic Panel: Recent Labs  Lab 05/27/20 0720 05/27/20 0720 05/28/20 0531 05/28/20 0531 05/29/20 0612 05/30/20 0551 05/31/20 0558 06/01/20 0455 06/02/20 0806  NA 139   < > 140   < > 140 139 137 137 140  K 4.7   < > 4.3   < > 3.8 3.6 3.6 3.6 4.1  CL 108   < > 110   < > 110 106 101 100 105  CO2 22   < > 23   < > 20* 23 27 27 26   GLUCOSE 127*   < > 115*   < > 94 107* 141* 171* 122*  BUN 13   < > 19   < > 23* 18 20 24* 16  CREATININE 1.46*   < > 1.38*   < > 1.10* 0.93 0.88 0.85 0.63  CALCIUM 8.1*   < > 7.9*   < > 7.7* 7.9* 7.8* 8.2* 8.2*  MG 2.5*  --  2.4  --  2.1 1.8  --   --   --   PHOS 4.6  --  4.3  --  3.9 4.4  --   --   --    < > = values in this interval not displayed.   GFR: Estimated Creatinine Clearance: 112.2 mL/min (by C-G formula based on SCr of 0.63 mg/dL). Liver Function Tests: Recent Labs  Lab 05/28/20 0531 05/29/20 0612 05/30/20 0551 05/31/20 0558 06/02/20 0806  AST 223* 82* 44* 27 22  ALT 134* 90* 69* 48* 29  ALKPHOS 134* 122 125 124 113  BILITOT 0.5 0.7 0.5 0.5 0.6    PROT 4.3* 4.2* 4.5* 4.9* 5.0*  ALBUMIN 1.5* 1.5* 1.6* 1.6* 1.8*   No results for input(s): LIPASE, AMYLASE in the last 168 hours. No results for input(s): AMMONIA in the last 168 hours.  Coagulation Profile: No results for input(s): INR, PROTIME in the last 168 hours. Cardiac Enzymes: No results for input(s): CKTOTAL, CKMB, CKMBINDEX, TROPONINI in the last 168 hours. BNP (last 3 results) No results for input(s): PROBNP in the last 8760 hours. HbA1C: No results for input(s): HGBA1C in the last 72 hours. CBG: Recent Labs  Lab 05/29/20 1046 05/29/20 1148 05/29/20 2110 05/30/20 0838 05/30/20 0940  GLUCAP 60* 95 104* 64* 92   Lipid Profile: No results for input(s): CHOL, HDL, LDLCALC, TRIG, CHOLHDL, LDLDIRECT in the last 72 hours. Thyroid Function Tests: No results for input(s): TSH, T4TOTAL, FREET4, T3FREE, THYROIDAB in the last 72 hours. Anemia Panel: No results for input(s): VITAMINB12, FOLATE, FERRITIN, TIBC, IRON, RETICCTPCT in the last 72 hours. Urine analysis:    Component Value Date/Time   COLORURINE STRAW (A) 04/20/2018 1539   APPEARANCEUR CLEAR 04/20/2018 1539   LABSPEC 1.005 04/20/2018 1539   PHURINE 7.0 04/20/2018 1539   GLUCOSEU NEGATIVE 04/20/2018 1539   HGBUR LARGE (A) 04/20/2018 1539   BILIRUBINUR NEGATIVE 04/20/2018 1539   KETONESUR NEGATIVE 04/20/2018 1539   PROTEINUR NEGATIVE 04/20/2018 1539   NITRITE NEGATIVE 04/20/2018 1539   LEUKOCYTESUR NEGATIVE 04/20/2018 1539   Sepsis Labs: Invalid input(s): PROCALCITONIN, LACTICIDVEN  No results found for this or any previous visit (from the past 240 hour(s)).    Radiology Studies: DG CHEST PORT 1 VIEW  Result Date: 06/02/2020 CLINICAL DATA:  Hypoxemia, postpartum, COVID positive EXAM: PORTABLE CHEST 1 VIEW COMPARISON:  05/25/2020 chest radiograph. FINDINGS: Stable cardiomediastinal silhouette with normal heart size. No pneumothorax. No pleural effusion. Hazy patchy opacities throughout both lungs, most  prominent in the lower lungs, similar to slightly worsened. IMPRESSION: Hazy patchy opacities throughout both lungs, most prominent in the lower lungs, similar to slightly worsened, compatible with COVID-19 pneumonia. Electronically Signed   By: Delbert Phenix M.D.   On: 06/02/2020 07:26     Pamella Pert, MD, PhD [facility]  Between 7 am - 7 pm I am available, please contact me via Amion or Securechat  Between 7 pm - 7 am I am not available, please contact night coverage MD/APP via Amion

## 2020-06-03 LAB — BASIC METABOLIC PANEL
Anion gap: 11 (ref 5–15)
BUN: 21 mg/dL — ABNORMAL HIGH (ref 6–20)
CO2: 26 mmol/L (ref 22–32)
Calcium: 8.5 mg/dL — ABNORMAL LOW (ref 8.9–10.3)
Chloride: 102 mmol/L (ref 98–111)
Creatinine, Ser: 0.91 mg/dL (ref 0.44–1.00)
GFR calc Af Amer: >60 mL/min (ref >60)
GFR calc non Af Amer: >60 mL/min (ref >60)
Glucose, Bld: 223 mg/dL — ABNORMAL HIGH (ref 70–99)
Potassium: 4.3 mmol/L (ref 3.5–5.1)
Sodium: 139 mmol/L (ref 135–145)

## 2020-06-03 NOTE — Progress Notes (Signed)
PROGRESS NOTE  ARLINDA BARCELONA ZOX:[id number] DOB: [birth year] DOA: 05/25/2020 PCP: Clayborn Heron, MD   LOS: 9 days   Brief Narrative / Interim history: Kristy Hines is an 39 y.o. female G2, P1 35 weeks who comes into the hospital for shortness of breath, her husband  and stepson tested positive for COVID-19 1 week ago she started getting short of breath several days came into the ED was placed on 6 L of oxygen.  Shortly after admission her oxygen requirements increased to 10 L.  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  She is status post C-section on 05/26/2020.  Subjective / 24h Interval events: Feeling better, she was able to shower and she had a cord extension for her oxygen.  Assessment & Plan:  Principal Problem Acute Hypoxic Respiratory Failure due to Covid-19 Viral Illness -Patient admitted to [facility], she was started on steroids, Remdesivir, and was hypoxic requiring 10 L high flow nasal cannula.  She seems to be improving, this morning she is on 5 L nasal cannula.  She is feeling better overall.  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  LFTs improving, platelets have now normalized  Acute kidney injury-creatinine has now normalized.  She has significant swelling in her legs, received IV fluids initially, s/p Lasix 9/29, 9/30 and repeat 10/2.  Renal function has remained stable  Scheduled Meds: . vitamin C  500 mg Oral Daily  . enoxaparin (LOVENOX) injection  50 mg Subcutaneous Q24H  . methylPREDNISolone (SOLU-MEDROL) injection  80 mg  Intravenous Q12H  . prenatal multivitamin  1 tablet Oral Q1200  . senna-docusate  2 tablet Oral Q24H  . simethicone  80 mg Oral TID PC  . simethicone  80 mg Oral Q24H  . sodium chloride flush  3 mL Intravenous Q12H  . zinc sulfate  220 mg Oral Daily   Continuous Infusions: . sodium chloride     PRN Meds:.sodium chloride, acetaminophen, albuterol, bisacodyl, calcium carbonate, chlorpheniramine-HYDROcodone, coconut oil, witch hazel-glycerin **AND** dibucaine, diphenhydrAMINE, guaiFENesin-dextromethorphan, HYDROmorphone (DILAUDID) injection, menthol-cetylpyridinium, ondansetron **OR** ondansetron (ZOFRAN) IV, oxyCODONE, polyethylene glycol, simethicone, sodium chloride flush, sodium phosphate, zolpidem  DVT prophylaxis: Lovenox Code Status: Full code Family Communication: husband at bedside    Status is: Inpatient  Remains inpatient appropriate because:Inpatient level of care appropriate due to severity of illness   Dispo: The patient is from: Home              Anticipated d/c is to: Home              Anticipated d/c date is: 3 days              Patient currently is not medically stable to d/c.  Procedures:  C section 9/26   Microbiology: none  Antibacterials: none   Objective: Vitals:   06/02/20 2009 06/03/20 0623 06/03/20 0808 06/03/20 0810  BP: 115/75 125/82 111/66   Pulse: 80 68 65   Resp: 19 18 (!) 22   Temp: 98.3 F (36.8 C) 97.6 F (36.4 C) 97.8 F (36.6  C)   TempSrc: Oral Oral    SpO2: 90% (!) 86%  90%  Weight:      Height:        Intake/Output Summary (Last 24 hours) at 06/03/2020 1224 Last data filed at 06/02/2020 2003 Gross per 24 hour  Intake 3 ml  Output --  Net 3 ml   Filed Weights   05/25/20 1754  Weight: 106.6 kg    Examination:  Constitutional: NAD Eyes: no icterus  ENMT: mmm Neck: normal, supple Respiratory: diminished at the bases, no wheezing, no crackles Cardiovascular: rrr, no mrg, trace edema Abdomen: soft, nt, nd,  bs+ Musculoskeletal: no clubbing / cyanosis.  Skin: no new rashes Neurologic: no focal deficits  Data Reviewed: I have independently reviewed following labs and imaging studies   CBC: Recent Labs  Lab 05/28/20 0531 05/28/20 0531 05/29/20 0612 05/30/20 0551 05/31/20 0558 06/01/20 0455 06/02/20 0806  WBC 20.1*   < > 16.3* 14.7* 13.1* 13.0* 11.9*  NEUTROABS 15.6*  --  15.3* 10.6* 10.1*  --   --   HGB 10.3*   < > 10.6* 11.4* 12.4 12.6 12.6  HCT 33.2*   < > 33.5* 35.6* 38.0 39.7 40.2  MCV 91.5   < > 90.8 87.7 87.2 87.4 88.2  PLT 201   < > 213 235 233 245 239   < > = values in this interval not displayed.   Basic Metabolic Panel: Recent Labs  Lab 05/28/20 0531 05/28/20 0531 05/29/20 0612 05/30/20 0551 05/31/20 0558 06/01/20 0455 06/02/20 0806  NA 140   < > 140 139 137 137 140  K 4.3   < > 3.8 3.6 3.6 3.6 4.1  CL 110   < > 110 106 101 100 105  CO2 23   < > 20* 23 27 27 26   GLUCOSE 115*   < > 94 107* 141* 171* 122*  BUN 19   < > 23* 18 20 24* 16  CREATININE 1.38*   < > 1.10* 0.93 0.88 0.85 0.63  CALCIUM 7.9*   < > 7.7* 7.9* 7.8* 8.2* 8.2*  MG 2.4  --  2.1 1.8  --   --   --   PHOS 4.3  --  3.9 4.4  --   --   --    < > = values in this interval not displayed.   GFR: Estimated Creatinine Clearance: 112.2 mL/min (by C-G formula based on SCr of 0.63 mg/dL). Liver Function Tests: Recent Labs  Lab 05/28/20 0531 05/29/20 0612 05/30/20 0551 05/31/20 0558 06/02/20 0806  AST 223* 82* 44* 27 22  ALT 134* 90* 69* 48* 29  ALKPHOS 134* 122 125 124 113  BILITOT 0.5 0.7 0.5 0.5 0.6  PROT 4.3* 4.2* 4.5* 4.9* 5.0*  ALBUMIN 1.5* 1.5* 1.6* 1.6* 1.8*   No results for input(s): LIPASE, AMYLASE in the last 168 hours. No results for input(s): AMMONIA in the last 168 hours. Coagulation Profile: No results for input(s): INR, PROTIME in the last 168 hours. Cardiac Enzymes: No results for input(s): CKTOTAL, CKMB, CKMBINDEX, TROPONINI in the last 168 hours. BNP (last 3 results) No  results for input(s): PROBNP in the last 8760 hours. HbA1C: No results for input(s): HGBA1C in the last 72 hours. CBG: Recent Labs  Lab 05/29/20 1046 05/29/20 1148 05/29/20 2110 05/30/20 0838 05/30/20 0940  GLUCAP 60* 95 104* 64* 92   Lipid Profile: No results for input(s): CHOL, HDL, LDLCALC, TRIG, CHOLHDL, LDLDIRECT in the last 72 hours. Thyroid  Function Tests: No results for input(s): TSH, T4TOTAL, FREET4, T3FREE, THYROIDAB in the last 72 hours. Anemia Panel: No results for input(s): VITAMINB12, FOLATE, FERRITIN, TIBC, IRON, RETICCTPCT in the last 72 hours. Urine analysis:    Component Value Date/Time   COLORURINE STRAW (A) 04/20/2018 1539   APPEARANCEUR CLEAR 04/20/2018 1539   LABSPEC 1.005 04/20/2018 1539   PHURINE 7.0 04/20/2018 1539   GLUCOSEU NEGATIVE 04/20/2018 1539   HGBUR LARGE (A) 04/20/2018 1539   BILIRUBINUR NEGATIVE 04/20/2018 1539   KETONESUR NEGATIVE 04/20/2018 1539   PROTEINUR NEGATIVE 04/20/2018 1539   NITRITE NEGATIVE 04/20/2018 1539   LEUKOCYTESUR NEGATIVE 04/20/2018 1539   Sepsis Labs: Invalid input(s): PROCALCITONIN, LACTICIDVEN  No results found for this or any previous visit (from the past 240 hour(s)).    Radiology Studies: DG CHEST PORT 1 VIEW  Result Date: 06/02/2020 CLINICAL DATA:  Hypoxemia, postpartum, COVID positive EXAM: PORTABLE CHEST 1 VIEW COMPARISON:  05/25/2020 chest radiograph. FINDINGS: Stable cardiomediastinal silhouette with normal heart size. No pneumothorax. No pleural effusion. Hazy patchy opacities throughout both lungs, most prominent in the lower lungs, similar to slightly worsened. IMPRESSION: Hazy patchy opacities throughout both lungs, most prominent in the lower lungs, similar to slightly worsened, compatible with COVID-19 pneumonia. Electronically Signed   By: Delbert Phenix M.D.   On: 06/02/2020 07:26     Pamella Pert, MD, PhD [facility]  Between 7 am - 7 pm I am available, please contact me via Amion or  Securechat  Between 7 pm - 7 am I am not available, please contact night coverage MD/APP via Amion

## 2020-06-04 LAB — COMPREHENSIVE METABOLIC PANEL
ALT: 21 U/L (ref 0–44)
AST: 16 U/L (ref 15–41)
Albumin: 2 g/dL — ABNORMAL LOW (ref 3.5–5.0)
Alkaline Phosphatase: 102 U/L (ref 38–126)
Anion gap: 8 (ref 5–15)
BUN: 19 mg/dL (ref 6–20)
CO2: 26 mmol/L (ref 22–32)
Calcium: 8.4 mg/dL — ABNORMAL LOW (ref 8.9–10.3)
Chloride: 105 mmol/L (ref 98–111)
Creatinine, Ser: 0.68 mg/dL (ref 0.44–1.00)
GFR calc Af Amer: >60 mL/min (ref >60)
GFR calc non Af Amer: >60 mL/min (ref >60)
Glucose, Bld: 117 mg/dL — ABNORMAL HIGH (ref 70–99)
Potassium: 4.4 mmol/L (ref 3.5–5.1)
Sodium: 139 mmol/L (ref 135–145)
Total Bilirubin: 0.6 mg/dL (ref 0.3–1.2)
Total Protein: 5 g/dL — ABNORMAL LOW (ref 6.5–8.1)

## 2020-06-04 LAB — CBC
HCT: 42 % (ref 36.0–46.0)
Hemoglobin: 13.1 g/dL (ref 12.0–15.0)
MCH: 27.8 pg (ref 26.0–34.0)
MCHC: 31.2 g/dL (ref 30.0–36.0)
MCV: 89.2 fL (ref 80.0–100.0)
Platelets: 244 10*3/uL (ref 150–400)
RBC: 4.71 MIL/uL (ref 3.87–5.11)
RDW: 13.6 % (ref 11.5–15.5)
WBC: 14.6 10*3/uL — ABNORMAL HIGH (ref 4.0–10.5)
nRBC: 0 % (ref 0.0–0.2)

## 2020-06-04 LAB — D-DIMER, QUANTITATIVE: D-Dimer, Quant: 0.77 ug/mL-FEU — ABNORMAL HIGH (ref 0.00–0.50)

## 2020-06-04 LAB — C-REACTIVE PROTEIN: CRP: <0.5 mg/dL (ref <1.0)

## 2020-06-04 MED ORDER — FUROSEMIDE 10 MG/ML IJ SOLN
40.0000 mg | Freq: Once | INTRAMUSCULAR | Status: AC
  Administered 2020-06-04: 40 mg via INTRAVENOUS
  Filled 2020-06-04: qty 4

## 2020-06-04 NOTE — Care Management (Addendum)
Follow up appt made  with [facility] on October 8th Friday at 10:15 am with Peri Maris NP.  CM spoke to Lennice Sites and MD Dr. Barbaraann Barthel in office.  CM informed patient and she is in agreement with plan and preferred this week appointment and also a tele- virtual visit this time.   Gretchen Short RNC-MNN, BSN [facility]

## 2020-06-04 NOTE — Progress Notes (Signed)
Pt maintaining SpO2 at 90-93% at rest on 3L O2 (nasal cannula). Increased O2 flow rate to 6L for ambulation. Pt ambulated from window to bathroom x10 laps. Tolerated well.   SpO2 remained between 88-91% while ambulating. Recovered well upon returning to bed- SpO2 93% at rest.   Weaned O2 back to 3L while at rest.

## 2020-06-04 NOTE — Progress Notes (Signed)
PROGRESS NOTE  Kristy GALKA Hines:[id number] DOB: Apr 25, 1981 DOA: 05/25/2020 PCP: Clayborn Heron, MD   LOS: 10 days   Brief Narrative / Interim history: Kristy Hines is an 39 y.o. female G2, P1 35 weeks who comes into the hospital for shortness of breath, her husband  and stepson tested positive for COVID-19 1 week ago she started getting short of breath several days came into the ED was placed on 6 L of oxygen.  Shortly after admission her oxygen requirements increased to 10 L.  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  She is status post C-section on 05/26/2020.  Subjective / 24h Interval events: Continues to improve.  Still short of breath with walking to the bathroom and back.  On 3 L  Assessment & Plan:  Principal Problem Acute Hypoxic Respiratory Failure due to Covid-19 Viral Illness -Patient admitted to [facility] she was started on steroids, Remdesivir, and was hypoxic requiring 10 L high flow nasal cannula.  She was slow to improve but eventually getting better, currently on 3 L at rest.  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  LFTs improving, platelets have now normalized  Acute kidney injury-creatinine has now normalized.  She has significant swelling in her legs, received IV fluids initially, s/p Lasix 9/29, 9/30 and repeat 10/2.  Renal function has remained stable.  Dose again Lasix today  Scheduled Meds: . vitamin C  500 mg Oral Daily  . enoxaparin (LOVENOX) injection  50 mg Subcutaneous Q24H  . methylPREDNISolone  (SOLU-MEDROL) injection  80 mg Intravenous Q12H  . prenatal multivitamin  1 tablet Oral Q1200  . senna-docusate  2 tablet Oral Q24H  . simethicone  80 mg Oral TID PC  . simethicone  80 mg Oral Q24H  . sodium chloride flush  3 mL Intravenous Q12H  . zinc sulfate  220 mg Oral Daily   Continuous Infusions: . sodium chloride     PRN Meds:.sodium chloride, acetaminophen, albuterol, bisacodyl, calcium carbonate, chlorpheniramine-HYDROcodone, coconut oil, witch hazel-glycerin **AND** dibucaine, diphenhydrAMINE, guaiFENesin-dextromethorphan, HYDROmorphone (DILAUDID) injection, menthol-cetylpyridinium, ondansetron **OR** ondansetron (ZOFRAN) IV, oxyCODONE, polyethylene glycol, simethicone, sodium chloride flush, sodium phosphate, zolpidem  DVT prophylaxis: Lovenox Code Status: Full code Family Communication: husband at bedside    Status is: Inpatient  Remains inpatient appropriate because:Inpatient level of care appropriate due to severity of illness   Dispo: The patient is from: Home              Anticipated d/c is to: Home              Anticipated d/c date is: 3 days              Patient currently is not medically stable to d/c.  Procedures:  C section 9/26   Microbiology: none  Antibacterials: none   Objective: Vitals:   06/03/20 1952 06/04/20 0021 06/04/20 0635 06/04/20 0803  BP: 106/63  130/76   Pulse: 85  (!) 55   Resp: 20     Temp: 98.3 F (36.8 C)  TempSrc: Oral     SpO2: 95% (!) 88% 91% 91%  Weight:      Height:       No intake or output data in the 24 hours ending 06/04/20 1124 Filed Weights   05/25/20 1754  Weight: 106.6 kg    Examination:  Constitutional: No distress, in bed Eyes: No scleral icterus ENMT: mmm Neck: normal, supple Respiratory: Overall clear, no wheezing, Cardiovascular: rrr, no mrg, trace edema Abdomen: soft, nt, nd, bs+ Musculoskeletal: no clubbing / cyanosis.  Skin: no rashes Neurologic: non focal  Data Reviewed: I have  independently reviewed following labs and imaging studies   CBC: Recent Labs  Lab 05/29/20 0612 05/29/20 0612 05/30/20 0551 05/31/20 0558 06/01/20 0455 06/02/20 0806 06/04/20 0640  WBC 16.3*   < > 14.7* 13.1* 13.0* 11.9* 14.6*  NEUTROABS 15.3*  --  10.6* 10.1*  --   --   --   HGB 10.6*   < > 11.4* 12.4 12.6 12.6 13.1  HCT 33.5*   < > 35.6* 38.0 39.7 40.2 42.0  MCV 90.8   < > 87.7 87.2 87.4 88.2 89.2  PLT 213   < > 235 233 245 239 244   < > = values in this interval not displayed.   Basic Metabolic Panel: Recent Labs  Lab 05/29/20 0612 05/29/20 0612 05/30/20 0551 05/30/20 0551 05/31/20 0558 06/01/20 0455 06/02/20 0806 06/03/20 1350 06/04/20 0640  NA 140   < > 139   < > 137 137 140 139 139  K 3.8   < > 3.6   < > 3.6 3.6 4.1 4.3 4.4  CL 110   < > 106   < > 101 100 105 102 105  CO2 20*   < > 23   < > 27 27 26 26 26   GLUCOSE 94   < > 107*   < > 141* 171* 122* 223* 117*  BUN 23*   < > 18   < > 20 24* 16 21* 19  CREATININE 1.10*   < > 0.93   < > 0.88 0.85 0.63 0.91 0.68  CALCIUM 7.7*   < > 7.9*   < > 7.8* 8.2* 8.2* 8.5* 8.4*  MG 2.1  --  1.8  --   --   --   --   --   --   PHOS 3.9  --  4.4  --   --   --   --   --   --    < > = values in this interval not displayed.   GFR: Estimated Creatinine Clearance: 112.2 mL/min (by C-G formula based on SCr of 0.68 mg/dL). Liver Function Tests: Recent Labs  Lab 05/29/20 0612 05/30/20 0551 05/31/20 0558 06/02/20 0806 06/04/20 0640  AST 82* 44* 27 22 16   ALT 90* 69* 48* 29 21  ALKPHOS 122 125 124 113 102  BILITOT 0.7 0.5 0.5 0.6 0.6  PROT 4.2* 4.5* 4.9* 5.0* 5.0*  ALBUMIN 1.5* 1.6* 1.6* 1.8* 2.0*   No results for input(s): LIPASE, AMYLASE in the last 168 hours. No results for input(s): AMMONIA in the last 168 hours. Coagulation Profile: No results for input(s): INR, PROTIME in the last 168 hours. Cardiac Enzymes: No results for input(s): CKTOTAL, CKMB, CKMBINDEX, TROPONINI in the last 168 hours. BNP (last 3 results) No  results for input(s): PROBNP in the last 8760 hours. HbA1C: No results for input(s): HGBA1C in the last 72 hours. CBG: Recent Labs  Lab 05/29/20  1046 05/29/20 1148 05/29/20 2110 05/30/20 0838 05/30/20 0940  GLUCAP 60* 95 104* 64* 92   Lipid Profile: No results for input(s): CHOL, HDL, LDLCALC, TRIG, CHOLHDL, LDLDIRECT in the last 72 hours. Thyroid Function Tests: No results for input(s): TSH, T4TOTAL, FREET4, T3FREE, THYROIDAB in the last 72 hours. Anemia Panel: No results for input(s): VITAMINB12, FOLATE, FERRITIN, TIBC, IRON, RETICCTPCT in the last 72 hours. Urine analysis:    Component Value Date/Time   COLORURINE STRAW (A) 04/20/2018 1539   APPEARANCEUR CLEAR 04/20/2018 1539   LABSPEC 1.005 04/20/2018 1539   PHURINE 7.0 04/20/2018 1539   GLUCOSEU NEGATIVE 04/20/2018 1539   HGBUR LARGE (A) 04/20/2018 1539   BILIRUBINUR NEGATIVE 04/20/2018 1539   KETONESUR NEGATIVE 04/20/2018 1539   PROTEINUR NEGATIVE 04/20/2018 1539   NITRITE NEGATIVE 04/20/2018 1539   LEUKOCYTESUR NEGATIVE 04/20/2018 1539   Sepsis Labs: Invalid input(s): PROCALCITONIN, LACTICIDVEN  No results found for this or any previous visit (from the past 240 hour(s)).    Radiology Studies: No results found.   Pamella Pert, MD, PhD [facility]  Between 7 am - 7 pm I am available, please contact me via Amion or Securechat  Between 7 pm - 7 am I am not available, please contact night coverage MD/APP via Amion

## 2020-06-04 NOTE — Care Management (Signed)
CM spoke to patient and discussed discharge plans.  Patient verified that her PCP is Dr. Beverley Fiedler [facility] at [address] , [place] , [place] and that demographics are correct in epic.  CM and patient discussed f/u appointment after and patient would like  CM will call office to arrange.  Patient has insurance with no barriers for medications or transportation.  Dad and infant is currently staying in room with patient.   CM will order dme- O2 if needed for discharge will follow for orders.  CM called PCP office to schedule a follow up appointment for patient and spoke to [facility] ph# [phone], awaiting call back from office.   Gretchen Short RNC-MNN, BSN [facility]

## 2020-06-05 DIAGNOSIS — R0902 Hypoxemia: Secondary | ICD-10-CM

## 2020-06-05 MED ORDER — PREDNISONE 10 MG PO TABS: 20.0000 mg | ORAL_TABLET | Freq: Every day | ORAL | 0 refills | Status: AC

## 2020-06-05 MED ORDER — CALCIUM CARBONATE ANTACID 500 MG PO CHEW: 1.0000 | CHEWABLE_TABLET | Freq: Two times a day (BID) | ORAL | Status: DC | PRN

## 2020-06-05 MED ORDER — GUAIFENESIN-DM 100-10 MG/5ML PO SYRP: 10.0000 mL | ORAL_SOLUTION | ORAL | 0 refills | Status: DC | PRN

## 2020-06-05 MED ORDER — PREDNISONE 10 MG PO TABS: 20.0000 mg | ORAL_TABLET | Freq: Every day | ORAL | 0 refills | Status: DC

## 2020-06-05 MED ORDER — ALBUTEROL SULFATE HFA 108 (90 BASE) MCG/ACT IN AERS: 2.0000 | INHALATION_SPRAY | RESPIRATORY_TRACT | 1 refills | Status: DC | PRN

## 2020-06-05 NOTE — Discharge Summary (Addendum)
Physician Discharge Summary  Kristy Hines QMV:[id number] DOB: 02/14/1981 DOA: 05/25/2020  PCP: Kristy Heron, MD  Admit date: 05/25/2020 Discharge date: 06/05/2020  Admitted From: home Disposition:  home  Recommendations for Outpatient Follow-up:  1. Follow up with PCP in 1-2 weeks 2. Please obtain BMP/CBC in one week 3. Please follow up on the following pending results:  Home Health: none Equipment/Devices: hme O2  Discharge Condition: stable CODE STATUS: Full code Diet recommendation: regular  HPI: Per admitting MD, Kristy Hines is an 39 y.o. female with term IUP with cerclage placement presenting with worsening COVID symptoms.She reports that her husband and stepson tested positive for COVID last week.  She and her 1yo daughter were tested last Friday (9/17).  The daughter's test came back but negative but the patient's test came back positive 2 days ago, on 9/22.  She began developing symptoms around that time, with fatigue, cough, myalgias, and SOB.  Her symptoms worsened and so she called her OB, who encouraged her to come in for evaluation.  She arrived on 6L with O2 sats in low 80s and so was reportedly placed on 10L with NRB.  As such, I called PCCM for admission rather than TRH.  However, PCCM found the patient to be on 10L NRB without [place] O2 and was able to wean her to 5L [place] O2 without difficulty.  As such, patient is appropriate for co-management with TRH and OB on the OB floor.  Hospital Course / Discharge diagnoses: Principal Problem Acute Hypoxic Respiratory Failure due to Covid-19 Viral Illness -Patient admitted to [facility], she was started on steroids, Remdesivir, and was hypoxic requiring 10 L high flow nasal cannula.  She was slow to improve but eventually improved significantly, has more energy, able to ambulate in the room on supplemental oxygen and feeling stronger and stronger each day.  She was set up with home oxygen and will be discharged in  stable condition.  She was counseled for quarantine for total of 21 days from the day of discharge.  She will follow up with PCP as an outpatient  Active Problems Pregnancy, concern for help syndrome variant -OB/GYN following, she is status post C-section 9/26.  LFTs improving, platelets have now normalized  Acute kidney injury-creatinine has now normalized.  She has significant swelling in her legs, was given Lasix for several days with improvement in her edema.  Renal function has remained normal.  Discharge Instructions   Allergies as of 06/05/2020   No Known Allergies     Medication List    STOP taking these medications   guaiFENesin 100 MG/5ML liquid Commonly known as: ROBITUSSIN   guaiFENesin 600 MG 12 hr tablet Commonly known as: MUCINEX     TAKE these medications   albuterol 108 (90 Base) MCG/ACT inhaler Commonly known as: VENTOLIN HFA Inhale 2 puffs into the lungs every 2 (two) hours as needed for wheezing or shortness of breath.   guaiFENesin-dextromethorphan 100-10 MG/5ML syrup Commonly known as: ROBITUSSIN DM Take 10 mLs by mouth every 4 (four) hours as needed for cough.   predniSONE 10 MG tablet Commonly known as: DELTASONE Take 2 tablets (20 mg total) by mouth daily for 4 days.            Durable Medical Equipment  (From admission, onward)         Start     Ordered   06/05/20 0934  For home use only DME oxygen  Once  Question Answer Comment  Length of Need 6 Months   Mode or (Route) Nasal cannula   Liters per Minute 3   Frequency Continuous (stationary and portable oxygen unit needed)   Oxygen delivery system Gas      06/05/20 0933          Follow-up Information    [facility] Follow up.   Why: appointment on October 8th at 10:15 with NP Kristy Hines- this is a tele visit, they will text you on your phone then call you. address: [address] [place], [place] [address] phone [phone]               Consultations:  ObGyn  Procedures/Studies:  DG CHEST PORT 1 VIEW  Result Date: 06/02/2020 CLINICAL DATA:  Hypoxemia, postpartum, COVID positive EXAM: PORTABLE CHEST 1 VIEW COMPARISON:  05/25/2020 chest radiograph. FINDINGS: Stable cardiomediastinal silhouette with normal heart size. No pneumothorax. No pleural effusion. Hazy patchy opacities throughout both lungs, most prominent in the lower lungs, similar to slightly worsened. IMPRESSION: Hazy patchy opacities throughout both lungs, most prominent in the lower lungs, similar to slightly worsened, compatible with COVID-19 pneumonia. Electronically Signed   By: Delbert Phenix M.D.   On: 06/02/2020 07:26   DG CHEST PORT 1 VIEW  Result Date: 05/25/2020 CLINICAL DATA:  Shortness of breath.  COVID-19 positive EXAM: PORTABLE CHEST 1 VIEW COMPARISON:  April 17, 2018 FINDINGS: There is airspace opacity consistent with pneumonia in the right lower lung region. Lungs elsewhere clear. Heart size and pulmonary vascularity are normal. No adenopathy. No bone lesions. IMPRESSION: Airspace opacity right base consistent with pneumonia. COVID-19 pneumonia potentially could present in this manner, although bacterial pneumonia could present similarly. Lungs elsewhere clear. Cardiac silhouette normal. No adenopathy. Electronically Signed   By: Bretta Bang III M.D.   On: 05/25/2020 13:24     Subjective: - no chest pain, mild shortness of breath significantly improved, no abdominal pain, nausea or vomiting.   Discharge Exam: BP 109/68   Pulse 81   Temp 97.7 F (36.5 C) (Oral)   Resp (!) 24   Ht 5\' 5"  (1.651 m)   Wt 106.6 kg   LMP 09/20/2019 (Exact Date)   SpO2 92%   Breastfeeding Unknown   BMI 39.11 kg/m   General: Pt is alert, awake, not in acute distress Cardiovascular: RRR, S1/S2 +, no rubs, no gallops Respiratory: CTA bilaterally, no wheezing, no rhonchi Abdominal: Soft, NT, ND, bowel sounds + Extremities: no edema, no  cyanosis    The results of significant diagnostics from this hospitalization (including imaging, microbiology, ancillary and laboratory) are listed below for reference.     Microbiology: No results found for this or any previous visit (from the past 240 hour(s)).   Labs: Basic Metabolic Panel: Recent Labs  Lab 05/30/20 0551 05/30/20 0551 05/31/20 0558 06/01/20 0455 06/02/20 0806 06/03/20 1350 06/04/20 0640  NA 139   < > 137 137 140 139 139  K 3.6   < > 3.6 3.6 4.1 4.3 4.4  CL 106   < > 101 100 105 102 105  CO2 23   < > 27 27 26 26 26   GLUCOSE 107*   < > 141* 171* 122* 223* 117*  BUN 18   < > 20 24* 16 21* 19  CREATININE 0.93   < > 0.88 0.85 0.63 0.91 0.68  CALCIUM 7.9*   < > 7.8* 8.2* 8.2* 8.5* 8.4*  MG 1.8  --   --   --   --   --   --  PHOS 4.4  --   --   --   --   --   --    < > = values in this interval not displayed.   Liver Function Tests: Recent Labs  Lab 05/30/20 0551 05/31/20 0558 06/02/20 0806 06/04/20 0640  AST 44* 27 22 16   ALT 69* 48* 29 21  ALKPHOS 125 124 113 102  BILITOT 0.5 0.5 0.6 0.6  PROT 4.5* 4.9* 5.0* 5.0*  ALBUMIN 1.6* 1.6* 1.8* 2.0*   CBC: Recent Labs  Lab 05/30/20 0551 05/31/20 0558 06/01/20 0455 06/02/20 0806 06/04/20 0640  WBC 14.7* 13.1* 13.0* 11.9* 14.6*  NEUTROABS 10.6* 10.1*  --   --   --   HGB 11.4* 12.4 12.6 12.6 13.1  HCT 35.6* 38.0 39.7 40.2 42.0  MCV 87.7 87.2 87.4 88.2 89.2  PLT 235 233 245 239 244   CBG: Recent Labs  Lab 05/29/20 1046 05/29/20 1148 05/29/20 2110 05/30/20 0838 05/30/20 0940  GLUCAP 60* 95 104* 64* 92   Hgb A1c No results for input(s): HGBA1C in the last 72 hours. Lipid Profile No results for input(s): CHOL, HDL, LDLCALC, TRIG, CHOLHDL, LDLDIRECT in the last 72 hours. Thyroid function studies No results for input(s): TSH, T4TOTAL, T3FREE, THYROIDAB in the last 72 hours.  Invalid input(s): FREET3 Urinalysis    Component Value Date/Time   COLORURINE STRAW (A) 04/20/2018 1539    APPEARANCEUR CLEAR 04/20/2018 1539   LABSPEC 1.005 04/20/2018 1539   PHURINE 7.0 04/20/2018 1539   GLUCOSEU NEGATIVE 04/20/2018 1539   HGBUR LARGE (A) 04/20/2018 1539   BILIRUBINUR NEGATIVE 04/20/2018 1539   KETONESUR NEGATIVE 04/20/2018 1539   PROTEINUR NEGATIVE 04/20/2018 1539   NITRITE NEGATIVE 04/20/2018 1539   LEUKOCYTESUR NEGATIVE 04/20/2018 1539    FURTHER DISCHARGE INSTRUCTIONS:   Get Medicines reviewed and adjusted: Please take all your medications with you for your next visit with your Primary MD   Laboratory/radiological data: Please request your Primary MD to go over all hospital tests and procedure/radiological results at the follow up, please ask your Primary MD to get all Hospital records sent to his/her office.   In some cases, they will be blood work, cultures and biopsy results pending at the time of your discharge. Please request that your primary care M.D. goes through all the records of your hospital data and follows up on these results.   Also Note the following: If you experience worsening of your admission symptoms, develop shortness of breath, life threatening emergency, suicidal or homicidal thoughts you must seek medical attention immediately by calling 911 or calling your MD immediately  if symptoms less severe.   You must read complete instructions/literature along with all the possible adverse reactions/side effects for all the Medicines you take and that have been prescribed to you. Take any new Medicines after you have completely understood and accpet all the possible adverse reactions/side effects.    Do not drive when taking Pain medications or sleeping medications (Benzodaizepines)   Do not take more than prescribed Pain, Sleep and Anxiety Medications. It is not advisable to combine anxiety,sleep and pain medications without talking with your primary care practitioner   Special Instructions: If you have smoked or chewed Tobacco  in the last 2 yrs  please stop smoking, stop any regular Alcohol  and or any Recreational drug use.   Wear Seat belts while driving.   Please note: You were cared for by a hospitalist during your hospital stay. Once you are discharged, your primary  care physician will handle any further medical issues. Please note that NO REFILLS for any discharge medications will be authorized once you are discharged, as it is imperative that you return to your primary care physician (or establish a relationship with a primary care physician if you do not have one) for your post hospital discharge needs so that they can reassess your need for medications and monitor your lab values.  Time coordinating discharge: 40 minutes  SIGNED:  Pamella Pertostin Sharlynn Seckinger, MD, PhD 06/05/2020, 10:16 AM

## 2020-06-05 NOTE — Progress Notes (Signed)
Pt discharged to home with husband and newborn.  Discharge instructions reviewed with pt and husband together.  [employer] representative instructed patient's husband in use of oxygen tank and oxygen machine.  Husband stepped into the hallway right outside of patient's room for education.  Pt home on 3L via [place] using the oxygen tank and will switch to the machine when she arrives home. Both pieces of DME in car at discharge.  Pt to car via wheelchair with T. Pulliam, NT.

## 2020-06-05 NOTE — Care Management Note (Signed)
Case Management Note  Patient Details  Name: MARKIA KYER MRN: [id number] Date of Birth: Jul 23, 1981  Subjective/Objective:     Steffanie Dunn an 39 y.o.femalewith term IUP with cerclage placement presenting with worsening COVID symptoms   Expected Discharge Date:  06/05/20                I Discharge planning Services  CM Consult, Follow-up appt scheduled- 10/8/21Deboraha Sprang Physician at 10:15 with Peri Maris NP tele visit.   Post Acute Care Choice:  Durable Medical Equipment  DME Arranged:  Oxygen 3 lit/[place] DME Agency:  [employer]   Additional Comments: CM received call from RN with request for patient to have O2 prior to discharge from hospital.  CM called Oletha Cruel- [employer] liaison ph# [phone] with referral for O2 at 3 Lit [place] and he accepted referral and will deliver portable to room prior to discharge and send stationary O2 to patient's home.  Demographics reviewed with mom prior and correct in epic.    06/05/2020, 9:57 AM

## 2020-06-05 NOTE — Discharge Instructions (Signed)
10 Things You Can Do to Manage Your COVID-19 Symptoms at Home If you have possible or confirmed COVID-19: 1. Stay home from work and school. And stay away from other public places. If you must go out, avoid using any kind of public transportation, ridesharing, or taxis. 2. Monitor your symptoms carefully. If your symptoms get worse, call your healthcare provider immediately. 3. Get rest and stay hydrated. 4. If you have a medical appointment, call the healthcare provider ahead of time and tell them that you have or may have COVID-19. 5. For medical emergencies, call 911 and notify the dispatch personnel that you have or may have COVID-19. 6. Cover your cough and sneezes with a tissue or use the inside of your elbow. 7. Wash your hands often with soap and water for at least 20 seconds or clean your hands with an alcohol-based hand sanitizer that contains at least 60% alcohol. 8. As much as possible, stay in a specific room and away from other people in your home. Also, you should use a separate bathroom, if available. If you need to be around other people in or outside of the home, wear a mask. 9. Avoid sharing personal items with other people in your household, like dishes, towels, and bedding. 10. Clean all surfaces that are touched often, like counters, tabletops, and doorknobs. Use household cleaning sprays or wipes according to the label instructions. cdc.gov/coronavirus 03/02/2019 This information is not intended to replace advice given to you by your health care provider. Make sure you discuss any questions you have with your health care provider. Document Revised: 08/04/2019 Document Reviewed: 08/04/2019 Elsevier Patient Education  2020 Elsevier Inc.  

## 2020-06-08 DIAGNOSIS — U071 COVID-19: Secondary | ICD-10-CM | POA: Diagnosis not present

## 2020-06-20 ENCOUNTER — Inpatient Hospital Stay (HOSPITAL_COMMUNITY): Admit: 2020-06-20 | Payer: BLUE CROSS/BLUE SHIELD | Admitting: Obstetrics & Gynecology

## 2020-06-22 DIAGNOSIS — Z8616 Personal history of COVID-19: Secondary | ICD-10-CM | POA: Diagnosis not present

## 2020-06-22 DIAGNOSIS — Z23 Encounter for immunization: Secondary | ICD-10-CM | POA: Diagnosis not present

## 2020-06-22 DIAGNOSIS — R0789 Other chest pain: Secondary | ICD-10-CM | POA: Diagnosis not present

## 2020-06-22 DIAGNOSIS — R059 Cough, unspecified: Secondary | ICD-10-CM | POA: Diagnosis not present

## 2020-07-09 DIAGNOSIS — N898 Other specified noninflammatory disorders of vagina: Secondary | ICD-10-CM | POA: Diagnosis not present

## 2020-07-09 DIAGNOSIS — Z98891 History of uterine scar from previous surgery: Secondary | ICD-10-CM | POA: Diagnosis not present

## 2020-10-08 DIAGNOSIS — Z01419 Encounter for gynecological examination (general) (routine) without abnormal findings: Secondary | ICD-10-CM | POA: Diagnosis not present

## 2020-10-08 DIAGNOSIS — N898 Other specified noninflammatory disorders of vagina: Secondary | ICD-10-CM | POA: Diagnosis not present

## 2020-10-12 ENCOUNTER — Other Ambulatory Visit: Payer: Self-pay | Admitting: Obstetrics and Gynecology

## 2020-10-12 DIAGNOSIS — Z1231 Encounter for screening mammogram for malignant neoplasm of breast: Secondary | ICD-10-CM

## 2020-12-03 ENCOUNTER — Inpatient Hospital Stay: Admission: RE | Admit: 2020-12-03 | Payer: BLUE CROSS/BLUE SHIELD | Source: Ambulatory Visit

## 2020-12-04 ENCOUNTER — Inpatient Hospital Stay: Admission: RE | Admit: 2020-12-04 | Payer: BLUE CROSS/BLUE SHIELD | Source: Ambulatory Visit

## 2020-12-08 ENCOUNTER — Ambulatory Visit
Admission: RE | Admit: 2020-12-08 | Discharge: 2020-12-08 | Disposition: A | Discharge status: Home / Self Care | Payer: BLUE CROSS/BLUE SHIELD | Source: Ambulatory Visit | Attending: Obstetrics and Gynecology | Admitting: Obstetrics and Gynecology

## 2020-12-08 ENCOUNTER — Other Ambulatory Visit: Payer: Self-pay

## 2020-12-08 DIAGNOSIS — Z1231 Encounter for screening mammogram for malignant neoplasm of breast: Secondary | ICD-10-CM

## 2021-02-12 IMAGING — US US MFM OB FOLLOW-UP
1 series · 13 of 28 positions shown · non-contrast
Comparison: none

[Series 1: us mfm ob follow-up · 39 acquisitions, 13 frames shown]
[im 2/39]
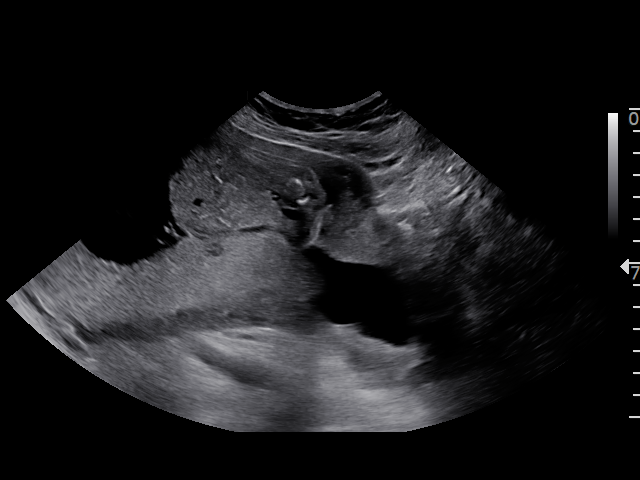
[im 5/39]
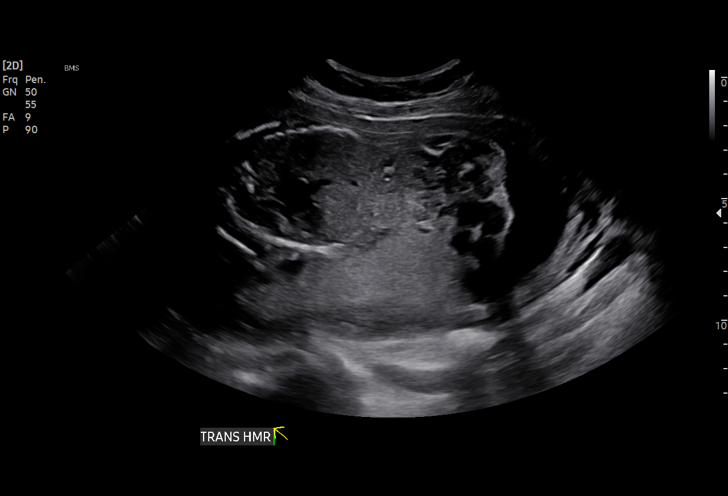
[im 8/39]
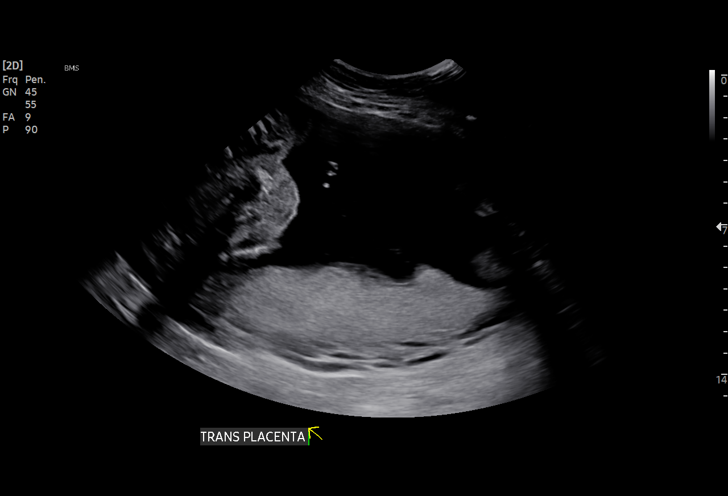
[im 10/39]
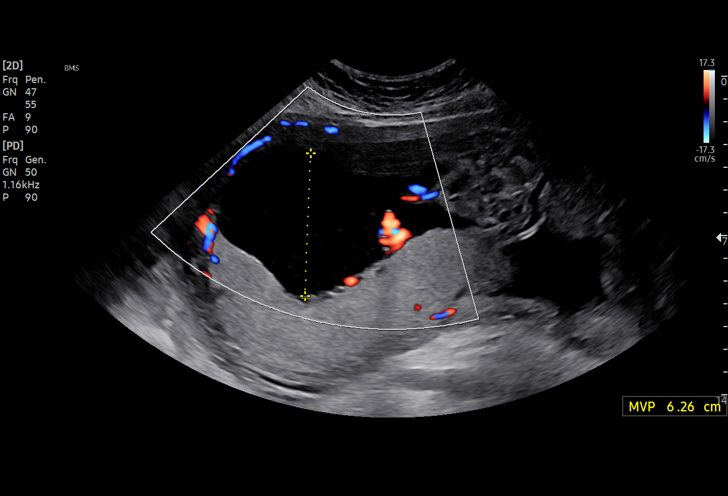
[im 13/39]
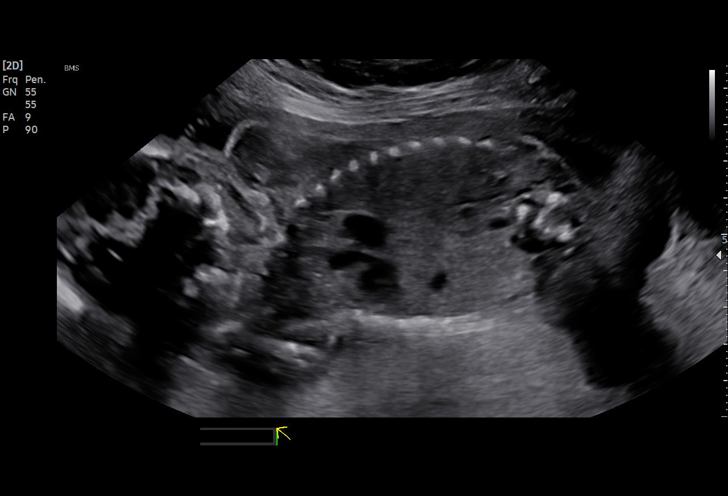
[im 16/39]
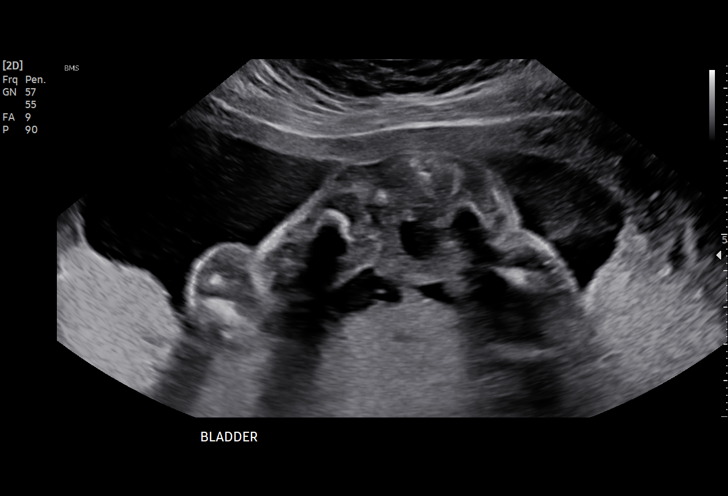
[im 20/39]
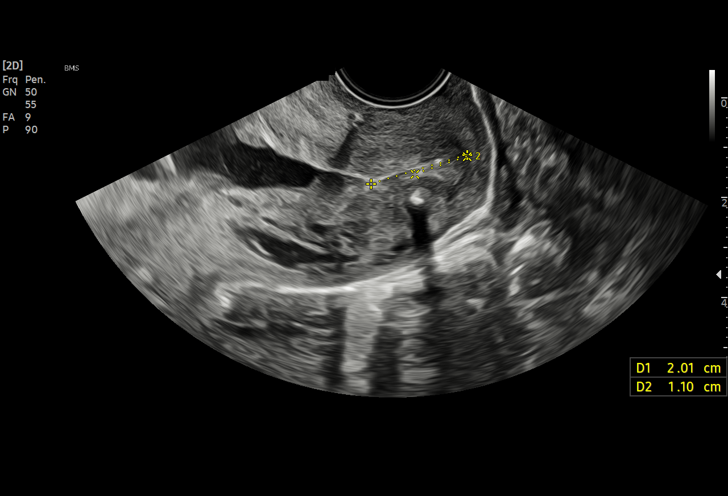
[im 23/39]
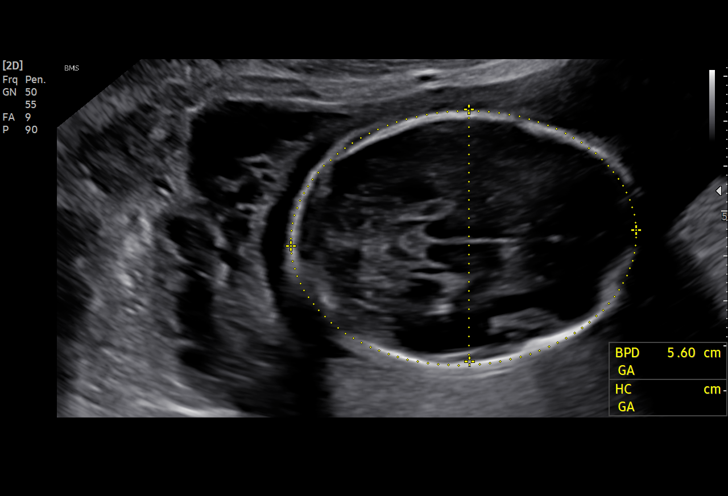
[im 26/39]
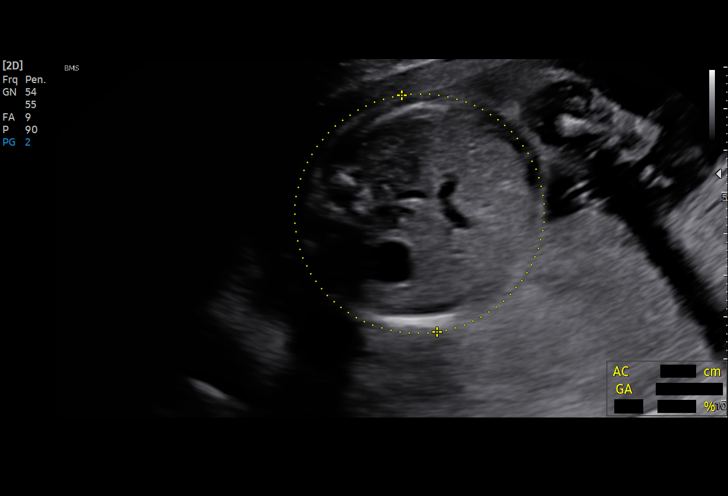
[im 29/39]
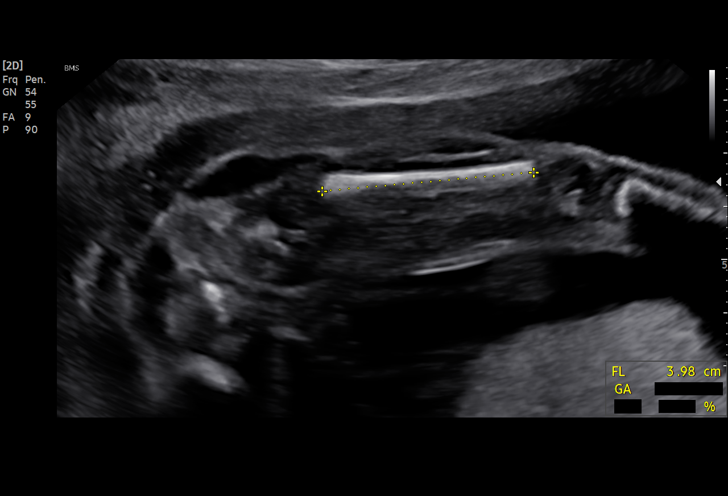
[im 31/39]
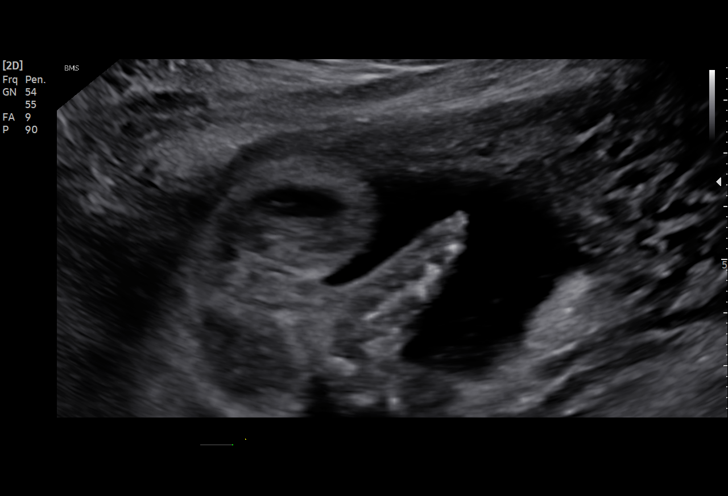
[im 34/39]
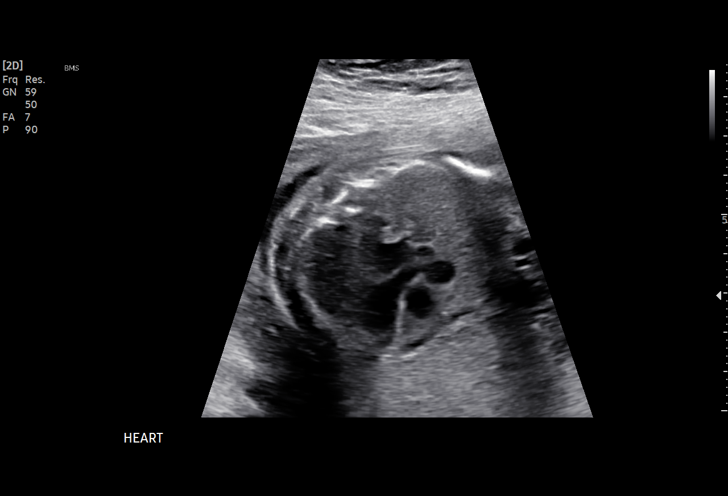
[im 37/39]
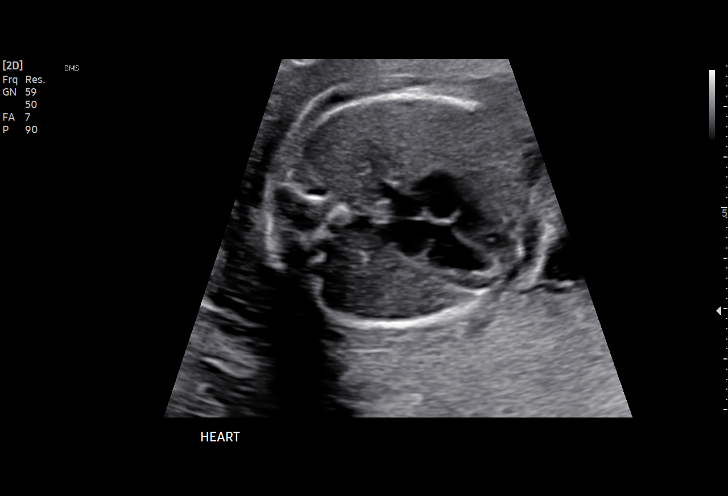

[13 of 28 positions shown; findings below may reference images not displayed]

Indications

 23 weeks gestation of pregnancy
 Cervical shortening, second trimester (s/p
 cerclage)
 Encounter for cervical length
 Advanced maternal age multigravida 35+,
 second trimester (39 yrs)
 Poor obstetrical history (DIC (PP
 hemorrhage,cevical laceration)
 Pregnancy complicated by previous gastric
 bypass, antepartum, second trimester
 Low RIsk NIPS
 Short interval between pregancies, 2nd
 trimester
 Obesity complicating pregnancy, second
 trimester
 Echogenic intracardiac focus of the heart
 (EIF)
Fetal Evaluation

 Num Of Fetuses:         1
 Fetal Heart Rate(bpm):  124
 Cardiac Activity:       Observed
 Presentation:           Transverse, head to maternal right
 Placenta:               Posterior
 P. Cord Insertion:      Previously Visualized
 Amniotic Fluid
 AFI FV:      Within normal limits

                             Largest Pocket(cm)

Biometry

 BPD:        56  mm     G. Age:  23w 1d         37  %    CI:         70.8   %    70 - 86
                                                         FL/HC:      18.7   %    19.2 -
 HC:      212.1  mm     G. Age:  23w 2d         34  %    HC/AC:      1.14        1.05 -
 AC:      186.3  mm     G. Age:  23w 3d         46  %    FL/BPD:     70.9   %    71 - 87
 FL:       39.7  mm     G. Age:  22w 6d         23  %    FL/AC:      21.3   %    20 - 24

 LV:        5.4  mm

 Est. FW:     568  gm      1 lb 4 oz     36  %
OB History

 Gravidity:    2         Term:   1        Prem:   0        SAB:   0
 TOP:          0       Ectopic:  0        Living: 1
Gestational Age

 LMP:           23w 2d        Date:  09/20/19                 EDD:   06/26/20
 U/S Today:     23w 1d                                        EDD:   06/27/20
 Best:          23w 2d     Det. By:  LMP  (09/20/19)          EDD:   06/26/20
Anatomy

 Cranium:               Previously seen        Aortic Arch:            Previously seen
 Cavum:                 Previously seen        Ductal Arch:            Previously seen
 Ventricles:            Appears normal         Diaphragm:              Appears normal
 Choroid Plexus:        Previously seen        Stomach:                Appears normal, left
                                                                       sided
 Cerebellum:            Previously seen        Abdomen:                Appears normal
 Posterior Fossa:       Previously seen        Abdominal Wall:         Appears nml (cord
                                                                       insert, abd wall)
 Nuchal Fold:           Previously seen        Cord Vessels:           Previously seen
 Face:                  Orbits and profile     Kidneys:                Appear normal
                        previously seen
 Lips:                  Previously seen        Bladder:                Appears normal
 Thoracic:              Previously seen        Spine:                  Previously seen
 Heart:                 Echogenic focus        Upper Extremities:      Previously seen
                        in LV
 RVOT:                  Not well visualized    Lower Extremities:      Previously seen
 LVOT:                  Appears normal

 Other:  Male gender. Technically difficult due to fetal position.
Impression

 Patient returned for completion of fetal anatomy and cervical
 length measurement.  On 02/19/2020, she had rescue
 cerclage following cervical shortening seen on ultrasound.
 Patient does not have symptoms of pelvic pressure or vaginal
 bleeding now.  She takes vaginal progesterone regularly.
 Amniotic fluid is normal and good fetal activity is seen .Fetal
 growth is appropriate for gestational age .Fetal anatomical
 survey was completed and appears normal.
 We performed transvaginal ultrasound.  The cervix measures
 2 cm (1.1 cm from the station to the external os).
 I have reassured the patient by comparing today's images
 with the previous one.

 I recommended that she continue taking vaginal
 progesterone.  Cerclage and vaginal progesterone do not
 guarantee carrying pregnancy to term.
Recommendations

 Follow-up scans as clinically indicated.
                 Eugenio, Fer

## 2021-02-27 DIAGNOSIS — J029 Acute pharyngitis, unspecified: Secondary | ICD-10-CM | POA: Diagnosis not present

## 2021-02-27 DIAGNOSIS — R059 Cough, unspecified: Secondary | ICD-10-CM | POA: Diagnosis not present

## 2021-02-27 DIAGNOSIS — Z03818 Encounter for observation for suspected exposure to other biological agents ruled out: Secondary | ICD-10-CM | POA: Diagnosis not present

## 2021-05-16 IMAGING — DX DG CHEST 1V PORT
1 series · 1 of 1 positions shown · non-contrast
Comparison: 05/25/2020 chest radiograph.

CLINICAL DATA: Hypoxemia, postpartum, COVID positive

EXAM:
PORTABLE CHEST 1 VIEW

[chest ap]
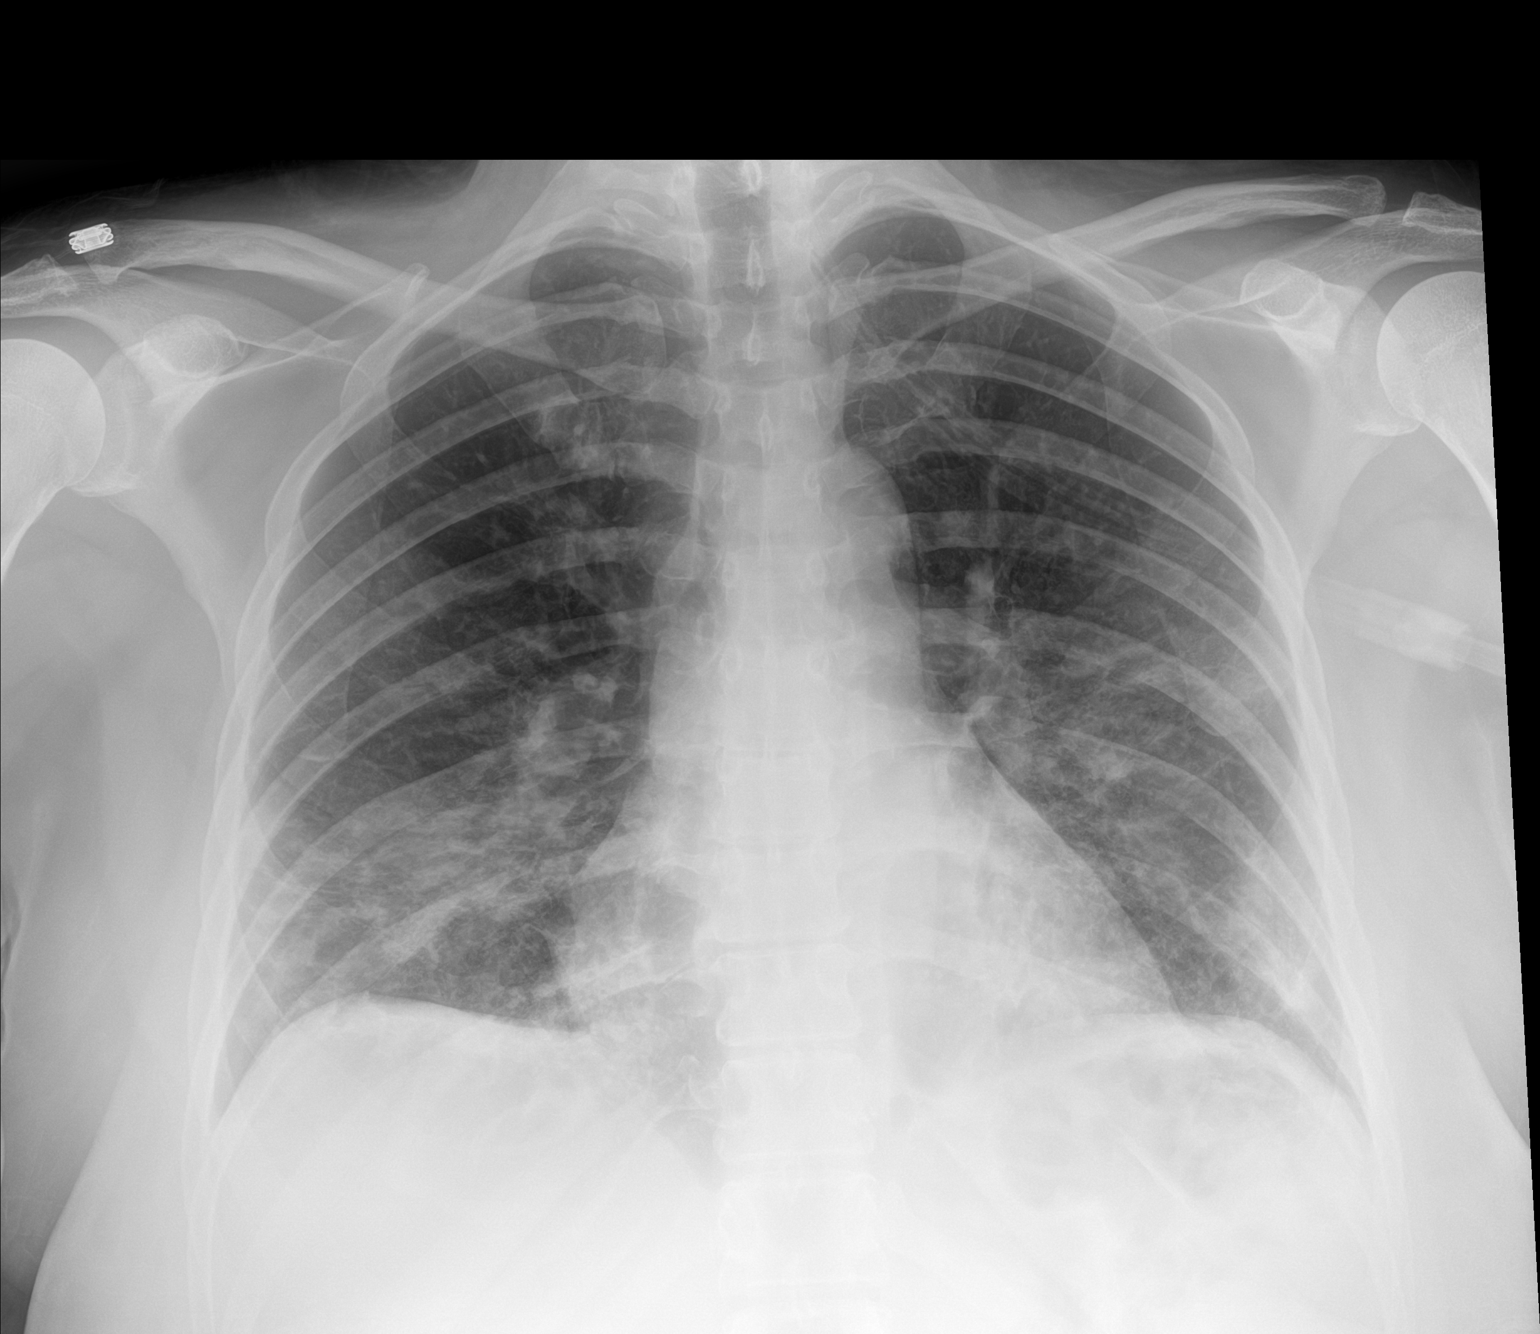

[1 of 1 positions shown; findings below may reference images not displayed]

FINDINGS: Stable cardiomediastinal silhouette with normal heart size. No
pneumothorax. No pleural effusion. Hazy patchy opacities throughout
both lungs, most prominent in the lower lungs, similar to slightly
worsened.
IMPRESSION: Hazy patchy opacities throughout both lungs, most prominent in the
lower lungs, similar to slightly worsened, compatible with [id number]
pneumonia.

## 2021-07-09 DIAGNOSIS — R059 Cough, unspecified: Secondary | ICD-10-CM | POA: Diagnosis not present

## 2021-07-09 DIAGNOSIS — J029 Acute pharyngitis, unspecified: Secondary | ICD-10-CM | POA: Diagnosis not present

## 2021-09-09 ENCOUNTER — Other Ambulatory Visit: Payer: Self-pay | Admitting: Obstetrics and Gynecology

## 2021-09-09 DIAGNOSIS — Z1231 Encounter for screening mammogram for malignant neoplasm of breast: Secondary | ICD-10-CM

## 2021-10-08 DIAGNOSIS — Z01419 Encounter for gynecological examination (general) (routine) without abnormal findings: Secondary | ICD-10-CM | POA: Diagnosis not present

## 2021-10-08 DIAGNOSIS — N763 Subacute and chronic vulvitis: Secondary | ICD-10-CM | POA: Diagnosis not present

## 2021-10-08 DIAGNOSIS — Z13220 Encounter for screening for lipoid disorders: Secondary | ICD-10-CM | POA: Diagnosis not present

## 2021-10-08 DIAGNOSIS — N898 Other specified noninflammatory disorders of vagina: Secondary | ICD-10-CM | POA: Diagnosis not present

## 2021-12-09 ENCOUNTER — Ambulatory Visit
Admission: RE | Admit: 2021-12-09 | Discharge: 2021-12-09 | Disposition: A | Discharge status: Home / Self Care | Payer: BC Managed Care – PPO | Source: Ambulatory Visit | Attending: Obstetrics and Gynecology | Admitting: Obstetrics and Gynecology

## 2021-12-09 DIAGNOSIS — Z1231 Encounter for screening mammogram for malignant neoplasm of breast: Secondary | ICD-10-CM

## 2022-03-04 DIAGNOSIS — U071 COVID-19: Secondary | ICD-10-CM | POA: Diagnosis not present

## 2022-09-07 DIAGNOSIS — Z20822 Contact with and (suspected) exposure to covid-19: Secondary | ICD-10-CM | POA: Diagnosis not present

## 2022-09-07 DIAGNOSIS — Z3202 Encounter for pregnancy test, result negative: Secondary | ICD-10-CM | POA: Diagnosis not present

## 2022-09-07 DIAGNOSIS — J069 Acute upper respiratory infection, unspecified: Secondary | ICD-10-CM | POA: Diagnosis not present

## 2022-10-14 ENCOUNTER — Other Ambulatory Visit (HOSPITAL_COMMUNITY)
Admission: RE | Admit: 2022-10-14 | Discharge: 2022-10-14 | Disposition: A | Discharge status: Home / Self Care | Payer: BC Managed Care – PPO | Source: Ambulatory Visit | Attending: Obstetrics and Gynecology | Admitting: Obstetrics and Gynecology

## 2022-10-14 DIAGNOSIS — Z01419 Encounter for gynecological examination (general) (routine) without abnormal findings: Secondary | ICD-10-CM | POA: Diagnosis not present

## 2022-10-14 DIAGNOSIS — N915 Oligomenorrhea, unspecified: Secondary | ICD-10-CM | POA: Diagnosis not present

## 2022-10-17 LAB — CYTOLOGY - PAP
Comment: NEGATIVE
Diagnosis: NEGATIVE
High risk HPV: NEGATIVE

## 2022-11-04 ENCOUNTER — Other Ambulatory Visit: Payer: Self-pay | Admitting: Obstetrics and Gynecology

## 2022-11-04 DIAGNOSIS — Z1231 Encounter for screening mammogram for malignant neoplasm of breast: Secondary | ICD-10-CM

## 2022-12-22 ENCOUNTER — Ambulatory Visit: Payer: BC Managed Care – PPO

## 2022-12-22 ENCOUNTER — Ambulatory Visit
Admission: RE | Admit: 2022-12-22 | Discharge: 2022-12-22 | Disposition: A | Discharge status: Home / Self Care | Payer: BC Managed Care – PPO | Source: Ambulatory Visit | Attending: Obstetrics and Gynecology | Admitting: Obstetrics and Gynecology

## 2022-12-22 DIAGNOSIS — Z1231 Encounter for screening mammogram for malignant neoplasm of breast: Secondary | ICD-10-CM

## 2023-01-29 DIAGNOSIS — J029 Acute pharyngitis, unspecified: Secondary | ICD-10-CM | POA: Diagnosis not present

## 2023-01-29 DIAGNOSIS — I83892 Varicose veins of left lower extremities with other complications: Secondary | ICD-10-CM | POA: Diagnosis not present

## 2023-02-05 ENCOUNTER — Other Ambulatory Visit: Payer: Self-pay | Admitting: *Deleted

## 2023-02-05 DIAGNOSIS — I8393 Asymptomatic varicose veins of bilateral lower extremities: Secondary | ICD-10-CM

## 2023-02-11 ENCOUNTER — Ambulatory Visit (HOSPITAL_COMMUNITY)
Admission: RE | Admit: 2023-02-11 | Discharge: 2023-02-11 | Disposition: A | Discharge status: Home / Self Care | Payer: BC Managed Care – PPO | Source: Ambulatory Visit | Attending: Vascular Surgery | Admitting: Vascular Surgery

## 2023-02-11 ENCOUNTER — Ambulatory Visit (INDEPENDENT_AMBULATORY_CARE_PROVIDER_SITE_OTHER): Payer: BC Managed Care – PPO | Admitting: Physician Assistant

## 2023-02-11 VITALS — BP 131/86 | HR 75 | Temp 98.0°F | Resp 16 | Ht 65.0 in | Wt 240.0 lb

## 2023-02-11 DIAGNOSIS — I8393 Asymptomatic varicose veins of bilateral lower extremities: Secondary | ICD-10-CM | POA: Diagnosis not present

## 2023-02-11 DIAGNOSIS — I872 Venous insufficiency (chronic) (peripheral): Secondary | ICD-10-CM

## 2023-02-11 NOTE — Progress Notes (Signed)
[facility] [place]   Reason for referral: Swollen left > right leg  History of Present Illness  Kristy Hines is a 42 y.o. female who presents with chief complaint: swollen leg.  Patient notes, onset of swelling  since 2019, associated with prolonged sitting and standing.  The patient has had no history of DVT, positive history of varicose vein, no history of venous stasis ulcers, no history of  Lymphedema and no history of skin changes in lower legs.  There is positive family history of venous disorders.  The patient has  used compression stockings in the past.  She states she has family history on her mom's side of venous disorders.  She has 2 young children and her swelling was there before she had her children, but got worse during and after her pregnancies. She denies rest pain, non healing wounds or claudication.  Past Medical History:  Diagnosis Date   Blood transfusion without reported diagnosis    Gallstones    Multiple thyroid nodules    Postpartum hemorrhage    >4L blood loss DIC    Past Surgical History:  Procedure Laterality Date   CERVICAL CERCLAGE N/A 02/19/2020   Procedure: CERCLAGE CERVICAL;  Surgeon: Noralee Space, MD;  Location: [facility]S;  Service: Gynecology;  Laterality: N/A;   CESAREAN SECTION N/A 05/26/2020   Procedure: CESAREAN SECTION;  Surgeon: Gerald Leitz, MD;  Location: [facility];  Service: Obstetrics;  Laterality: N/A;   CHOLECYSTECTOMY N/A 04/21/2018   Procedure: LAPAROSCOPIC CHOLECYSTECTOMY;  Surgeon: Jimmye Norman, MD;  Location: [facility] OR;  Service: General;  Laterality: N/A;   ERCP Left 04/21/2018   Procedure: ENDOSCOPIC RETROGRADE CHOLANGIOPANCREATOGRAPHY (ERCP);  Surgeon: Kerin Salen, MD;  Location: [facility] ENDOSCOPY;  Service: Gastroenterology;  Laterality: Left;   IR ANGIOGRAM PELVIS SELECTIVE OR SUPRASELECTIVE  02/28/2019   IR ANGIOGRAM PELVIS SELECTIVE OR SUPRASELECTIVE  02/28/2019   IR ANGIOGRAM SELECTIVE EACH ADDITIONAL VESSEL  02/28/2019    IR ANGIOGRAM SELECTIVE EACH ADDITIONAL VESSEL  02/28/2019   IR EMBO ART  VEN HEMORR LYMPH EXTRAV  INC GUIDE ROADMAPPING  02/28/2019   IR US GUIDE VASC ACCESS LEFT  02/28/2019   IR US GUIDE VASC ACCESS RIGHT  02/28/2019   LAPAROSCOPIC GASTRIC SLEEVE RESECTION  07/2017   PERINEAL LACERATION REPAIR N/A 02/28/2019   Procedure: SUTURE REPAIR PERINEAL LACERATION;  Surgeon: Myna Hidalgo, DO;  Location: [facility];  Service: Gynecology;  Laterality: N/A;   REMOVAL OF STONES  04/21/2018   Procedure: REMOVAL OF STONES;  Surgeon: Kerin Salen, MD;  Location: [facility] ENDOSCOPY;  Service: Gastroenterology;;   Dennison Mascot  04/21/2018   Procedure: Dennison Mascot;  Surgeon: Kerin Salen, MD;  Location: [facility] ENDOSCOPY;  Service: Gastroenterology;;    Social History   Socioeconomic History   Marital status: Single    Spouse name: Not on file   Number of children: Not on file   Years of education: Not on file   Highest education level: Not on file  Occupational History   Not on file  Tobacco Use   Smoking status: Never   Smokeless tobacco: Never  Vaping Use   Vaping Use: Never used  Substance and Sexual Activity   Alcohol use: Not Currently   Drug use: Never   Sexual activity: Yes  Other Topics Concern   Not on file  Social History Narrative   Not on file   Social Determinants of Health   Financial Resource Strain: Low Risk  (02/16/2019)   Overall Financial  Resource Strain (CARDIA)    Difficulty of Paying Living Expenses: Not hard at all  Food Insecurity: No Food Insecurity (02/16/2019)   Hunger Vital Sign    Worried About Running Out of Food in the Last Year: Never true    Ran Out of Food in the Last Year: Never true  Transportation Needs: Unknown (02/16/2019)   PRAPARE - Administrator, Civil Service (Medical): No    Lack of Transportation (Non-Medical): Not on file  Physical Activity: Not on file  Stress: No Stress Concern Present (02/16/2019)   [employer] of Occupational  Health - Occupational Stress Questionnaire    Feeling of Stress : Not at all  Social Connections: Not on file  Intimate Partner Violence: Not At Risk (02/16/2019)   Humiliation, Afraid, Rape, and Kick questionnaire    Fear of Current or Ex-Partner: No    Emotionally Abused: No    Physically Abused: No    Sexually Abused: No    Family History  Problem Relation Age of Onset   Heart disease Father    Hypertension Father    Hyperlipidemia Father    Cancer Maternal Grandfather    Hypertension Mother    Leukemia Maternal Aunt     Current Outpatient Medications on File Prior to Visit  Medication Sig Dispense Refill   albuterol (VENTOLIN HFA) 108 (90 Base) MCG/ACT inhaler Inhale 2 puffs into the lungs every 2 (two) hours as needed for wheezing or shortness of breath. 1 each 1   guaiFENesin-dextromethorphan (ROBITUSSIN DM) 100-10 MG/5ML syrup Take 10 mLs by mouth every 4 (four) hours as needed for cough. 118 mL 0   No current facility-administered medications on file prior to visit.    Allergies as of 02/11/2023   (No Known Allergies)     ROS:   General:  No weight loss, Fever, chills  HEENT: No recent headaches, no nasal bleeding, no visual changes, no sore throat  Neurologic: No dizziness, blackouts, seizures. No recent symptoms of stroke or mini- stroke. No recent episodes of slurred speech, or temporary blindness.  Cardiac: No recent episodes of chest pain/pressure, no shortness of breath at rest.  No shortness of breath with exertion.  Denies history of atrial fibrillation or irregular heartbeat  Vascular: No history of rest pain in feet.  No history of claudication.  No history of non-healing ulcer, No history of DVT   Pulmonary: No home oxygen, no productive cough, no hemoptysis,  No asthma or wheezing  Musculoskeletal:  [ ]  Arthritis, [ ]  Low back pain,  [ ]  Joint pain  Hematologic:No history of hypercoagulable state.  No history of easy bleeding.  No history of  anemia  Gastrointestinal: No hematochezia or melena,  No gastroesophageal reflux, no trouble swallowing  Urinary: [ ]  chronic Kidney disease, [ ]  on HD - [ ]  MWF or [ ]  TTHS, [ ]  Burning with urination, [ ]  Frequent urination, [ ]  Difficulty urinating;   Skin: No rashes  Psychological: No history of anxiety,  No history of depression  Physical Examination  Vitals:   02/11/23 1414  BP: 131/86  Pulse: 75  Resp: 16  Temp: 98 F (36.7 C)  TempSrc: Temporal  SpO2: 98%  Weight: 240 lb (108.9 kg)  Height: 5\' 5"  (1.651 m)    Body mass index is 39.94 kg/m.  General:  Alert and oriented, no acute distress HEENT: Normal Neck: No bruit or JVD Pulmonary: Clear to auscultation bilaterally Cardiac: Regular Rate and Rhythm without murmur  Abdomen: Soft, non-tender, non-distended, no mass, no scars Skin: No rash Extremity Pulses:   radial,  dorsalis pedis, posterior tibial pulses bilaterally Musculoskeletal: No deformity or edema  Neurologic: Upper and lower extremity motor 5/5 and symmetric  DATA:  +--------------+---------+------+-----------+------------+-------------+  LEFT         Reflux NoRefluxReflux TimeDiameter cmsComments                               Yes                                        +--------------+---------+------+-----------+------------+-------------+  CFV                    yes   >1 second                            +--------------+---------+------+-----------+------------+-------------+  FV mid        no                                                   +--------------+---------+------+-----------+------------+-------------+  Popliteal              yes   >1 second                            +--------------+---------+------+-----------+------------+-------------+  GSV at SFJ              yes    >500 ms      0.83                   +--------------+---------+------+-----------+------------+-------------+  GSV prox  thigh          yes    >500 ms      0.73                   +--------------+---------+------+-----------+------------+-------------+  GSV mid thigh           yes    >500 ms      0.61                   +--------------+---------+------+-----------+------------+-------------+  GSV dist thigh          yes    >500 ms      0.67                   +--------------+---------+------+-----------+------------+-------------+  GSV at knee             yes    >500 ms      0.68                   +--------------+---------+------+-----------+------------+-------------+  GSV prox calf no                            0.17    out of fascia  +--------------+---------+------+-----------+------------+-------------+  GSV mid calf  no                            0.17                   +--------------+---------+------+-----------+------------+-------------+  SSV Pop Fossa no                            0.35                   +--------------+---------+------+-----------+------------+-------------+  SSV prox calf no                            0.34                   +--------------+---------+------+-----------+------------+-------------+  SSV mid calf  no                            0.38                   +--------------+---------+------+-----------+------------+-------------+  AASV O        no                            0.31                   +--------------+---------+------+-----------+------------+-------------+  AASV P0.30    no                            0.30                   +--------------+---------+------+-----------+------------+-------------+  AASV M        no                            0.24                   +--------------+---------+------+-----------+------------+-------------+     Summary:  Left:  - No evidence of deep vein thrombosis seen in the left lower extremity,  from the common femoral through the popliteal veins.  - No  evidence of superficial venous reflux seen in the left short  saphenous vein.  - Venous reflux is noted in the left common femoral vein.  - Venous reflux is noted in the left sapheno-femoral junction.  - Venous reflux is noted in the left greater saphenous vein in the thigh.  - Venous reflux is noted in the left popliteal vein.   Assessment: Venous reflux left LE She has had edema worsening over the past several years.  She has worn knee high compression in the past.  There are no ischemic or chronic venous skin changes.   Reflux is noted in the SFJ throughout the GSV to the knee with the vein size> 0.4 throughout.    Plan: She will be placed in thigh high compression to be worn daily, exercise, elevation when at rest and water therapy if available.  She will f/u in 3 months to discuss possible laser ablation therapy with varicose stab phlebectomy.Mosetta Pigeon PA-C [facility] [place] Office: [phone]  MD in clinic [place]

## 2023-02-23 ENCOUNTER — Other Ambulatory Visit: Payer: Self-pay

## 2023-02-23 DIAGNOSIS — I872 Venous insufficiency (chronic) (peripheral): Secondary | ICD-10-CM

## 2023-05-25 ENCOUNTER — Encounter: Payer: Self-pay | Admitting: Surgery

## 2023-05-25 ENCOUNTER — Ambulatory Visit (INDEPENDENT_AMBULATORY_CARE_PROVIDER_SITE_OTHER): Payer: BC Managed Care – PPO | Admitting: Surgery

## 2023-05-25 ENCOUNTER — Ambulatory Visit (HOSPITAL_COMMUNITY)
Admission: RE | Admit: 2023-05-25 | Discharge: 2023-05-25 | Disposition: A | Discharge status: Home / Self Care | Payer: BC Managed Care – PPO | Source: Ambulatory Visit | Attending: Vascular Surgery | Admitting: Vascular Surgery

## 2023-05-25 VITALS — BP 126/75 | HR 74 | Temp 97.2°F | Resp 18 | Ht 65.0 in | Wt 238.9 lb

## 2023-05-25 DIAGNOSIS — I83812 Varicose veins of left lower extremities with pain: Secondary | ICD-10-CM

## 2023-05-25 DIAGNOSIS — I872 Venous insufficiency (chronic) (peripheral): Secondary | ICD-10-CM | POA: Diagnosis not present

## 2023-05-25 NOTE — Progress Notes (Signed)
[facility] [place] [facility]  Patient name: Kristy Hines MRN: [id number] DOB: 02-10-1981 Sex: female   REASON FOR VISIT:    Follow up  HISOTRY OF PRESENT ILLNESS:    Kristy Hines is a 42 y.o. female who was seen in the [facility] clinic in June 2020 for for leg swelling, left greater than right.  This began in 2019.  Her symptoms get worse with prolonged sitting and standing.  There is a family history of venous disorders under mom.  She denies any history of DVT.  She has never had ulcers.  She was encouraged to keep her legs elevated.  She was placed in 20-30 thigh-high compression stockings.   PAST MEDICAL HISTORY:   Past Medical History:  Diagnosis Date   Blood transfusion without reported diagnosis    Gallstones    Multiple thyroid nodules    Postpartum hemorrhage    >4L blood loss DIC     FAMILY HISTORY:   Family History  Problem Relation Age of Onset   Heart disease Father    Hypertension Father    Hyperlipidemia Father    Cancer Maternal Grandfather    Hypertension Mother    Leukemia Maternal Aunt     SOCIAL HISTORY:   Social History   Tobacco Use   Smoking status: Never   Smokeless tobacco: Never  Substance Use Topics   Alcohol use: Not Currently     ALLERGIES:   No Known Allergies   CURRENT MEDICATIONS:   Current Outpatient Medications  Medication Sig Dispense Refill   albuterol (VENTOLIN HFA) 108 (90 Base) MCG/ACT inhaler Inhale 2 puffs into the lungs every 2 (two) hours as needed for wheezing or shortness of breath. 1 each 1   guaiFENesin-dextromethorphan (ROBITUSSIN DM) 100-10 MG/5ML syrup Take 10 mLs by mouth every 4 (four) hours as needed for cough. 118 mL 0   No current facility-administered medications for this visit.    REVIEW OF SYSTEMS:   [X]  denotes positive finding, [ ]  denotes negative finding Cardiac  Comments:  Chest pain or chest pressure:    Shortness of breath upon exertion:     Short of breath when lying flat:    Irregular heart rhythm:        Vascular    Pain in calf, thigh, or hip brought on by ambulation:    Pain in feet at night that wakes you up from your sleep:     Blood clot in your veins:    Leg swelling:  x       Pulmonary    Oxygen at home:    Productive cough:     Wheezing:         Neurologic    Sudden weakness in arms or legs:     Sudden numbness in arms or legs:     Sudden onset of difficulty speaking or slurred speech:    Temporary loss of vision in one eye:     Problems with dizziness:         Gastrointestinal    Blood in stool:     Vomited blood:         Genitourinary    Burning when urinating:     Blood in urine:        Psychiatric    Major depression:         Hematologic    Bleeding problems:    Problems with blood clotting too easily:  Skin    Rashes or ulcers:        Constitutional    Fever or chills:      PHYSICAL EXAM:   Vitals:   05/25/23 1405  BP: 126/75  Pulse: 74  Resp: 18  Temp: (!) 97.2 F (36.2 C)  TempSrc: Temporal  SpO2: 98%  Weight: 238 lb 14.4 oz (108.4 kg)  Height: 5\' 5"  (1.651 m)    GENERAL: The patient is a well-nourished female, in no acute distress. The vital signs are documented above. CARDIAC: There is a regular rate and rhythm.  VASCULAR: SonoSite was used to evaluate the left saphenous vein which is very dilated and straight with a few branches.  The can probably be cannulated at or below the knee PULMONARY: Non-labored respirations ABDOMEN: Soft and non-tender with normal pitched bowel sounds.  MUSCULOSKELETAL: There are no major deformities or cyanosis. NEUROLOGIC: No focal weakness or paresthesias are detected. SKIN: There are no ulcers or rashes noted. PSYCHIATRIC: The patient has a normal affect.     STUDIES:   I have reviewed the following venous reflux study: Venous Reflux Times  +--------------+---------+------+-----------+------------+-------------+   RIGHT        Reflux NoRefluxReflux TimeDiameter cmsComments                               Yes                                        +--------------+---------+------+-----------+------------+-------------+  CFV          no                                                   +--------------+---------+------+-----------+------------+-------------+  FV mid        no                                                   +--------------+---------+------+-----------+------------+-------------+  Popliteal    no                                                   +--------------+---------+------+-----------+------------+-------------+  GSV at SFJ              yes    >500 ms      .922                   +--------------+---------+------+-----------+------------+-------------+  GSV prox thigh          yes    >500 ms      .465                   +--------------+---------+------+-----------+------------+-------------+  GSV mid thigh           yes    >500 ms      .500                   +--------------+---------+------+-----------+------------+-------------+  GSV dist thighno                            .402    out of fascia  +--------------+---------+------+-----------+------------+-------------+  GSV at knee   no                            .349    out of fascia  +--------------+---------+------+-----------+------------+-------------+  GSV prox calf           yes    >500 ms      .410                   +--------------+---------+------+-----------+------------+-------------+  SSV Pop Fossa no                            .286                   +--------------+---------+------+-----------+------------+-------------+  SSV prox calf no                            .383                   +--------------+---------+------+-----------+------------+-------------+               no                            .420                    +--------------+---------+------+-----------+------------+------------    Summary:  Right:  - No evidence of deep vein thrombosis seen in the right lower extremity,  from the common femoral through the popliteal veins.  - No evidence of superficial venous thrombosis in the right lower  extremity.  - Venous reflux is noted in the right sapheno-femoral junction.  - Venous reflux is noted in the right greater saphenous vein in the thigh.  - Venous reflux is noted in the right greater saphenous vein in the calf.     LEFT; +--------------+---------+------+-----------+------------+-------------+  LEFT         Reflux NoRefluxReflux TimeDiameter cmsComments                               Yes                                        +--------------+---------+------+-----------+------------+-------------+  CFV                    yes   >1 second                            +--------------+---------+------+-----------+------------+-------------+  FV mid        no                                                   +--------------+---------+------+-----------+------------+-------------+  Popliteal  yes   >1 second                            +--------------+---------+------+-----------+------------+-------------+  GSV at [facility]              yes    >500 ms      0.83                   +--------------+---------+------+-----------+------------+-------------+  GSV prox thigh          yes    >500 ms      0.73                   +--------------+---------+------+-----------+------------+-------------+  GSV mid thigh           yes    >500 ms      0.61                   +--------------+---------+------+-----------+------------+-------------+  GSV dist thigh          yes    >500 ms      0.67                   +--------------+---------+------+-----------+------------+-------------+  GSV at knee             yes    >500 ms      0.68                    +--------------+---------+------+-----------+------------+-------------+  GSV prox calf no                            0.17    out of fascia  +--------------+---------+------+-----------+------------+-------------+  GSV mid calf  no                            0.17                   +--------------+---------+------+-----------+------------+-------------+  SSV Pop Fossa no                            0.35                   +--------------+---------+------+-----------+------------+-------------+  SSV prox calf no                            0.34                   +--------------+---------+------+-----------+------------+-------------+  SSV mid calf  no                            0.38                   +--------------+---------+------+-----------+------------+-------------+  AASV O        no                            0.31                   +--------------+---------+------+-----------+------------+-------------+  AASV P0.30    no  0.30                   +--------------+---------+------+-----------+------------+-------------+  AASV M        no                            0.24                   +--------------+---------+------+-----------+------------+-------------+         Summary:  Left:  - No evidence of deep vein thrombosis seen in the left lower extremity,  from the common femoral through the popliteal veins.  - No evidence of superficial venous reflux seen in the left short  saphenous vein.  - Venous reflux is noted in the left common femoral vein.  - Venous reflux is noted in the left sapheno-femoral junction.  - Venous reflux is noted in the left greater saphenous  MEDICAL ISSUES:   CEAP class III, left leg: The patient has failed conservative therapy with leg elevation and compression therapy.  She has lost weight and is exercising.  She still has significant pain and discomfort in her leg as  well as marked swelling at the end of the day when she is on her feet.  She has superficial and deep vein reflux as well as prominent varicosities on the medial calf.  We discussed proceeding with left saphenous vein laser ablation beginning at or below the knee as well as 10-20 stabs.  I discussed that she has concomitant deep vein reflux and so we should make her significantly better but may not completely resolve her symptoms.  She understands this and wants to proceed.  She states that she may want to have another child down the road, however she is currently separated and so that may not be possible.  She is having significant limitations from the leg and she really wants to get this done.    Charlena Cross, MD, FACS [facility] [place] [facility] [phone] Pager [phone]

## 2023-06-10 ENCOUNTER — Other Ambulatory Visit: Payer: Self-pay | Admitting: *Deleted

## 2023-06-10 DIAGNOSIS — I83812 Varicose veins of left lower extremities with pain: Secondary | ICD-10-CM

## 2023-07-28 ENCOUNTER — Other Ambulatory Visit: Payer: Self-pay | Admitting: *Deleted

## 2023-07-28 MED ORDER — LORAZEPAM 1 MG PO TABS: ORAL_TABLET | ORAL | 0 refills | Status: DC

## 2023-07-28 NOTE — Progress Notes (Signed)
Note opened in error.

## 2023-08-06 ENCOUNTER — Encounter: Payer: Self-pay | Admitting: Surgery

## 2023-08-06 ENCOUNTER — Ambulatory Visit: Payer: BC Managed Care – PPO | Admitting: Surgery

## 2023-08-06 VITALS — BP 113/72 | HR 91 | Temp 97.9°F | Resp 18 | Ht 65.0 in | Wt 235.0 lb

## 2023-08-06 DIAGNOSIS — I83812 Varicose veins of left lower extremities with pain: Secondary | ICD-10-CM

## 2023-08-06 HISTORY — PX: LASER ABLATION: SHX1947

## 2023-08-06 NOTE — Progress Notes (Signed)
     Laser Ablation Procedure    Date: 08/06/2023   ALEJANDRA DIEBERT DOB:Sep 13, 1980  Consent signed: Yes     Surgeon: Coral Else Procedure: Laser Ablation: left Greater Saphenous Vein  08/06/2023 11:11 AM   BP 113/72  BP Location Left Arm  Patient Position Sitting  Cuff Size Normal  Pulse 91  Resp 18  Temp 97.9 F (36.6 C)  Temp src Temporal  SpO2 97 %  Weight 235 lb (106.6 kg)  Height 5\' 5"  (1.651 m)   Tumescent Anesthesia: 400 cc 0.9% NaCl with 50 cc Lidocaine HCL 1%  and 15 cc 8.4% NaHCO3  Local Anesthesia: 10 cc Lidocaine HCL and NaHCO3 (ratio 2:1)  7 watts continuous mode    Total energy: 1654.2 joules    Total time: 236 seconds Treatment Length 34 cm  Laser Fiber Ref. #   [id number]        Lot # [id number]  Stab Phlebectomy: 10-20 Sites: Calf  Patient tolerated procedure well  Notes: .All staff members wore facial masks. Pt had 1 mg ativan at 9:20 am and another at 10:25 am . Making a total of 2 mg of ativan prior to surgical procedure  Description of Procedure: After marking the course of the secondary varicosities, the patient was placed on the operating table in the supine position, and the left leg was prepped and draped in sterile fashion.   Local anesthetic was administered and under ultrasound guidance the saphenous vein was accessed with a micro needle and guide wire; then the mirco puncture sheath was placed.  A guide wire was inserted saphenofemoral junction , followed by a 5 french sheath.  The position of the sheath and then the laser fiber below the junction was confirmed using the ultrasound.  Tumescent anesthesia was administered along the course of the saphenous vein using ultrasound guidance. The patient was placed in Trendelenburg position and protective laser glasses were placed on patient and staff, and the laser was fired at 7 watts continuous mode for a total of 1654.2 joules.  For stab phlebectomies, local anesthetic was administered at the previously  marked varicosities, and tumescent anesthesia was administered around the vessels.  Ten to 20 stab wounds were made using the tip of an 11 blade. And using the vein hook, the phlebectomies were performed using a hemostat to avulse the varicosities.  Adequate hemostasis was achieved.   Steri strips were applied to the stab wounds and ABD pads and thigh high compression stockings were applied.  Ace wrap bandages were applied over the phlebectomy sites and at the top of the saphenofemoral junction. Blood loss was less than 15 cc.  Discharge instructions reviewed with patient and hardcopy of discharge instructions given to patient to take home. The patient ambulated out of the operating room having tolerated the procedure well.

## 2023-08-18 ENCOUNTER — Telehealth: Payer: Self-pay

## 2023-08-18 NOTE — Telephone Encounter (Signed)
Pt called yesterday with c/o blisters behind knee that have popped and crusted over. She has started applying antibacterial ointment on them. She has been offered an appt with MD for this thurs and adamantly refused it. She states she has childcare issues and wants to keep her appt as scheduled for next week.

## 2023-08-24 ENCOUNTER — Ambulatory Visit (INDEPENDENT_AMBULATORY_CARE_PROVIDER_SITE_OTHER): Payer: BC Managed Care – PPO | Admitting: Surgery

## 2023-08-24 ENCOUNTER — Encounter: Payer: Self-pay | Admitting: Surgery

## 2023-08-24 ENCOUNTER — Ambulatory Visit (HOSPITAL_COMMUNITY)
Admission: RE | Admit: 2023-08-24 | Discharge: 2023-08-24 | Disposition: A | Discharge status: Home / Self Care | Payer: BC Managed Care – PPO | Source: Ambulatory Visit | Attending: Surgery | Admitting: Surgery

## 2023-08-24 VITALS — BP 110/77 | HR 78 | Temp 98.0°F | Resp 20 | Ht 65.0 in | Wt 242.0 lb

## 2023-08-24 DIAGNOSIS — I83812 Varicose veins of left lower extremities with pain: Secondary | ICD-10-CM | POA: Diagnosis not present

## 2023-08-24 MED ORDER — CEPHALEXIN 500 MG PO CAPS: 500.0000 mg | ORAL_CAPSULE | Freq: Three times a day (TID) | ORAL | 0 refills | Status: AC

## 2023-08-24 NOTE — Progress Notes (Signed)
   Patient name: Kristy Hines MRN: [id number] DOB: 02/08/1981 Sex: female  REASON FOR VISIT:    Postop  HISTORY OF PRESENT ILLNESS:   Kristy Hines is a 42 y.o. female who is status post laser ablation of the left great saphenous vein with 10-20 stabs on 08/06/2023.This was done for class III disease.  She states that her leg swelling and pain at the end of the day when she is on her feet is significantly better.  However, she developed a blister behind her knee after the procedure that turned into a small open area that has slowly been getting better  CURRENT MEDICATIONS:    No current outpatient medications on file.   No current facility-administered medications for this visit.    REVIEW OF SYSTEMS:   [X]  denotes positive finding, [ ]  denotes negative finding Cardiac  Comments:  Chest pain or chest pressure:    Shortness of breath upon exertion:    Short of breath when lying flat:    Irregular heart rhythm:    Constitutional    Fever or chills:      PHYSICAL EXAM:   Vitals:   08/24/23 1028  BP: 110/77  Pulse: 78  Resp: 20  Temp: 98 F (36.7 C)  SpO2: 98%  Weight: 242 lb (109.8 kg)  Height: 5\' 5"  (1.651 m)    GENERAL: The patient is a well-nourished female, in no acute distress. The vital signs are documented above. CARDIOVASCULAR: There is a regular rate and rhythm. PULMONARY: Non-labored respirations   STUDIES:   Left:  - No evidence of deep vein thrombosis seen in the left lower extremity,  from the common femoral through the popliteal veins.  - Successful great saphenous vein ablation from the distal thigh to within  3.38 cm to the [facility]    MEDICAL ISSUES:   Status post successful laser ablation of the left saphenous vein with stab phlebectomy's.  Ultrasound shows successful ablation.  She did get blistering behind her knee.  This does not appear to be from a laser burn.  It does appear to be getting better.  I do not  think it is infected but I am going to give her 5 days of antibiotics just to be on the safe side.  I suspect this will heal up within the next week or so.  She will contact me if she has any further concerns.  Charlena Cross, MD, FACS [facility] [phone] Pager [phone]

## 2023-08-31 ENCOUNTER — Telehealth: Payer: Self-pay

## 2023-08-31 NOTE — Telephone Encounter (Signed)
Pt called with c/o having to stop Keflex after 3 days due to all over body itching/hives from it. She feels she is allergic to Keflex, though she was unaware of any allergy to it. She stopped it a few days ago and the itching is somewhat better but still bothersome. She does not want to take Benadryl because she is primary parent for young children and gets too sleepy with Benadryl. Pt has been advised to try topical Benadryl and Allegra Hives (non drowsy). She is in agreement to try this. All questions/concerns answered.

## 2023-09-11 ENCOUNTER — Telehealth: Payer: Self-pay

## 2023-09-11 NOTE — Telephone Encounter (Signed)
 Pt called requesting Prednisone , as she is still c/o itching all over when she stops the Allegra hive medication. Per APP, she should call her PCP for this. Pt is aware and verbalized understanding. She is very frustrated as she states she is a single parent and has 2 young children and can't go to appts.She will call us  back if she has further questions/concerns.

## 2023-10-23 DIAGNOSIS — Z1152 Encounter for screening for COVID-19: Secondary | ICD-10-CM | POA: Diagnosis not present

## 2023-10-23 DIAGNOSIS — R059 Cough, unspecified: Secondary | ICD-10-CM | POA: Diagnosis not present

## 2023-10-23 DIAGNOSIS — J0110 Acute frontal sinusitis, unspecified: Secondary | ICD-10-CM | POA: Diagnosis not present

## 2023-11-05 DIAGNOSIS — Z01419 Encounter for gynecological examination (general) (routine) without abnormal findings: Secondary | ICD-10-CM | POA: Diagnosis not present

## 2023-11-05 DIAGNOSIS — N915 Oligomenorrhea, unspecified: Secondary | ICD-10-CM | POA: Diagnosis not present

## 2023-11-05 DIAGNOSIS — Z13220 Encounter for screening for lipoid disorders: Secondary | ICD-10-CM | POA: Diagnosis not present

## 2023-11-06 ENCOUNTER — Other Ambulatory Visit: Payer: Self-pay | Admitting: Obstetrics and Gynecology

## 2023-11-06 DIAGNOSIS — Z1231 Encounter for screening mammogram for malignant neoplasm of breast: Secondary | ICD-10-CM

## 2023-11-24 DIAGNOSIS — R748 Abnormal levels of other serum enzymes: Secondary | ICD-10-CM | POA: Diagnosis not present

## 2023-11-24 DIAGNOSIS — E7800 Pure hypercholesterolemia, unspecified: Secondary | ICD-10-CM | POA: Diagnosis not present

## 2023-11-24 DIAGNOSIS — Z9884 Bariatric surgery status: Secondary | ICD-10-CM | POA: Diagnosis not present

## 2023-11-24 DIAGNOSIS — Z6841 Body Mass Index (BMI) 40.0 and over, adult: Secondary | ICD-10-CM | POA: Diagnosis not present

## 2023-12-24 ENCOUNTER — Ambulatory Visit
Admission: RE | Admit: 2023-12-24 | Discharge: 2023-12-24 | Disposition: A | Discharge status: Home / Self Care | Source: Ambulatory Visit | Attending: Obstetrics and Gynecology | Admitting: Obstetrics and Gynecology

## 2023-12-24 ENCOUNTER — Encounter: Payer: Self-pay | Admitting: Radiology

## 2023-12-24 DIAGNOSIS — Z1231 Encounter for screening mammogram for malignant neoplasm of breast: Secondary | ICD-10-CM | POA: Diagnosis not present

## 2023-12-29 ENCOUNTER — Other Ambulatory Visit: Payer: Self-pay | Admitting: Obstetrics and Gynecology

## 2023-12-29 DIAGNOSIS — R928 Other abnormal and inconclusive findings on diagnostic imaging of breast: Secondary | ICD-10-CM

## 2024-01-01 DIAGNOSIS — E7800 Pure hypercholesterolemia, unspecified: Secondary | ICD-10-CM | POA: Diagnosis not present

## 2024-01-01 DIAGNOSIS — R748 Abnormal levels of other serum enzymes: Secondary | ICD-10-CM | POA: Diagnosis not present

## 2024-01-06 ENCOUNTER — Encounter (HOSPITAL_COMMUNITY): Payer: Self-pay

## 2024-01-06 DIAGNOSIS — Z1211 Encounter for screening for malignant neoplasm of colon: Secondary | ICD-10-CM | POA: Diagnosis not present

## 2024-01-13 ENCOUNTER — Ambulatory Visit
Admission: RE | Admit: 2024-01-13 | Discharge: 2024-01-13 | Disposition: A | Discharge status: Home / Self Care | Source: Ambulatory Visit | Attending: Obstetrics and Gynecology | Admitting: Obstetrics and Gynecology

## 2024-01-13 DIAGNOSIS — R928 Other abnormal and inconclusive findings on diagnostic imaging of breast: Secondary | ICD-10-CM

## 2024-01-13 DIAGNOSIS — N6001 Solitary cyst of right breast: Secondary | ICD-10-CM | POA: Diagnosis not present

## 2024-01-13 DIAGNOSIS — N6315 Unspecified lump in the right breast, overlapping quadrants: Secondary | ICD-10-CM | POA: Diagnosis not present

## 2024-02-03 DIAGNOSIS — E7800 Pure hypercholesterolemia, unspecified: Secondary | ICD-10-CM | POA: Diagnosis not present

## 2024-02-03 DIAGNOSIS — R748 Abnormal levels of other serum enzymes: Secondary | ICD-10-CM | POA: Diagnosis not present

## 2024-04-14 DIAGNOSIS — E7800 Pure hypercholesterolemia, unspecified: Secondary | ICD-10-CM | POA: Diagnosis not present

## 2024-04-14 DIAGNOSIS — Z9884 Bariatric surgery status: Secondary | ICD-10-CM | POA: Diagnosis not present

## 2024-04-14 DIAGNOSIS — R748 Abnormal levels of other serum enzymes: Secondary | ICD-10-CM | POA: Diagnosis not present

## 2024-04-14 DIAGNOSIS — Z6841 Body Mass Index (BMI) 40.0 and over, adult: Secondary | ICD-10-CM | POA: Diagnosis not present
